# Patient Record
Sex: Female | Born: 1965
Health system: Southern US, Community
[De-identification: ages and names within clinical notes are randomized; demographics above are authoritative.]

## PROBLEM LIST (undated history)

## (undated) DIAGNOSIS — F419 Anxiety disorder, unspecified: Secondary | ICD-10-CM

## (undated) DIAGNOSIS — G709 Myoneural disorder, unspecified: Secondary | ICD-10-CM

## (undated) DIAGNOSIS — Z87442 Personal history of urinary calculi: Secondary | ICD-10-CM

## (undated) DIAGNOSIS — G2581 Restless legs syndrome: Secondary | ICD-10-CM

## (undated) DIAGNOSIS — E785 Hyperlipidemia, unspecified: Secondary | ICD-10-CM

## (undated) DIAGNOSIS — G4730 Sleep apnea, unspecified: Secondary | ICD-10-CM

## (undated) DIAGNOSIS — K219 Gastro-esophageal reflux disease without esophagitis: Secondary | ICD-10-CM

## (undated) DIAGNOSIS — E669 Obesity, unspecified: Secondary | ICD-10-CM

## (undated) DIAGNOSIS — Z8719 Personal history of other diseases of the digestive system: Secondary | ICD-10-CM

## (undated) DIAGNOSIS — M1990 Unspecified osteoarthritis, unspecified site: Secondary | ICD-10-CM

## (undated) DIAGNOSIS — I10 Essential (primary) hypertension: Secondary | ICD-10-CM

## (undated) DIAGNOSIS — N189 Chronic kidney disease, unspecified: Secondary | ICD-10-CM

## (undated) HISTORY — DX: Myoneural disorder, unspecified: G70.9

## (undated) HISTORY — PX: COLONOSCOPY: SHX174

## (undated) HISTORY — DX: Hyperlipidemia, unspecified: E78.5

## (undated) HISTORY — PX: SPINE SURGERY: SHX786

## (undated) HISTORY — DX: Gastro-esophageal reflux disease without esophagitis: K21.9

## (undated) HISTORY — DX: Essential (primary) hypertension: I10

## (undated) HISTORY — PX: REDUCTION MAMMAPLASTY: SUR839

## (undated) HISTORY — DX: Sleep apnea, unspecified: G47.30

## (undated) HISTORY — DX: Chronic kidney disease, unspecified: N18.9

## (undated) HISTORY — DX: Obesity, unspecified: E66.9

## (undated) HISTORY — PX: OTHER SURGICAL HISTORY: SHX169

## (undated) HISTORY — PX: LAPAROSCOPIC GASTRIC BANDING: SHX1100

## (undated) HISTORY — PX: ABDOMINAL HYSTERECTOMY: SHX81

## (undated) HISTORY — PX: BREAST SURGERY: SHX581

---

## 1997-08-10 ENCOUNTER — Observation Stay (HOSPITAL_COMMUNITY): Admission: RE | Admit: 1997-08-10 | Discharge: 1997-08-11 | Payer: Self-pay | Admitting: *Deleted

## 1998-06-16 HISTORY — PX: BREAST SURGERY: SHX581

## 1999-01-10 ENCOUNTER — Encounter: Payer: Self-pay | Admitting: Internal Medicine

## 1999-01-10 ENCOUNTER — Ambulatory Visit (HOSPITAL_COMMUNITY): Admission: RE | Admit: 1999-01-10 | Discharge: 1999-01-10 | Payer: Self-pay | Admitting: Internal Medicine

## 1999-03-01 ENCOUNTER — Encounter (INDEPENDENT_AMBULATORY_CARE_PROVIDER_SITE_OTHER): Payer: Self-pay

## 1999-03-01 ENCOUNTER — Inpatient Hospital Stay (HOSPITAL_COMMUNITY): Admission: RE | Admit: 1999-03-01 | Discharge: 1999-03-03 | Payer: Self-pay | Admitting: Obstetrics & Gynecology

## 1999-07-04 ENCOUNTER — Other Ambulatory Visit: Admission: RE | Admit: 1999-07-04 | Discharge: 1999-07-04 | Payer: Self-pay | Admitting: Plastic Surgery

## 1999-07-04 ENCOUNTER — Encounter (INDEPENDENT_AMBULATORY_CARE_PROVIDER_SITE_OTHER): Payer: Self-pay | Admitting: Specialist

## 2001-04-09 ENCOUNTER — Emergency Department (HOSPITAL_COMMUNITY): Admission: EM | Admit: 2001-04-09 | Discharge: 2001-04-10 | Payer: Self-pay | Admitting: Emergency Medicine

## 2001-04-10 ENCOUNTER — Encounter: Payer: Self-pay | Admitting: Emergency Medicine

## 2001-12-07 ENCOUNTER — Other Ambulatory Visit: Admission: RE | Admit: 2001-12-07 | Discharge: 2001-12-07 | Payer: Self-pay | Admitting: *Deleted

## 2002-05-23 ENCOUNTER — Ambulatory Visit (HOSPITAL_BASED_OUTPATIENT_CLINIC_OR_DEPARTMENT_OTHER): Admission: RE | Admit: 2002-05-23 | Discharge: 2002-05-23 | Payer: Self-pay | Admitting: Pulmonary Disease

## 2003-06-17 HISTORY — PX: TUBAL LIGATION: SHX77

## 2004-05-01 ENCOUNTER — Other Ambulatory Visit: Admission: RE | Admit: 2004-05-01 | Discharge: 2004-05-01 | Payer: Self-pay | Admitting: *Deleted

## 2005-05-26 ENCOUNTER — Other Ambulatory Visit: Admission: RE | Admit: 2005-05-26 | Discharge: 2005-05-26 | Payer: Self-pay | Admitting: *Deleted

## 2006-04-15 ENCOUNTER — Ambulatory Visit: Payer: Self-pay | Admitting: Internal Medicine

## 2006-04-15 LAB — CONVERTED CEMR LAB
ALT: 17 units/L (ref 0–40)
Bilirubin Urine: NEGATIVE
CO2: 29 meq/L (ref 19–32)
Calcium: 8.9 mg/dL (ref 8.4–10.5)
Chloride: 107 meq/L (ref 96–112)
Chol/HDL Ratio, serum: 5.1
Creatinine, Ser: 0.7 mg/dL (ref 0.4–1.2)
Glucose, Bld: 93 mg/dL (ref 70–99)
HDL: 33.3 mg/dL — ABNORMAL LOW (ref >39.0)
Hemoglobin, Urine: NEGATIVE
Hemoglobin: 14 g/dL (ref 12.0–15.0)
Leukocytes, UA: NEGATIVE
MCHC: 33.3 g/dL (ref 30.0–36.0)
MCV: 93.1 fL (ref 78.0–100.0)
Platelets: 309 10*3/uL (ref 150–400)
Potassium: 4.2 meq/L (ref 3.5–5.1)
RBC: 4.52 M/uL (ref 3.87–5.11)
RDW: 13.2 % (ref 11.5–14.6)
TSH: 1.51 microintl units/mL (ref 0.35–5.50)
Triglyceride fasting, serum: 54 mg/dL (ref 0–149)
Urine Glucose: NEGATIVE mg/dL

## 2006-07-03 ENCOUNTER — Ambulatory Visit: Payer: Self-pay | Admitting: Internal Medicine

## 2006-12-31 ENCOUNTER — Emergency Department (HOSPITAL_COMMUNITY): Admission: EM | Admit: 2006-12-31 | Discharge: 2006-12-31 | Payer: Self-pay | Admitting: Emergency Medicine

## 2006-12-31 ENCOUNTER — Encounter: Admission: RE | Admit: 2006-12-31 | Discharge: 2006-12-31 | Payer: Self-pay | Admitting: Surgery

## 2007-01-01 ENCOUNTER — Ambulatory Visit (HOSPITAL_COMMUNITY): Admission: RE | Admit: 2007-01-01 | Discharge: 2007-01-01 | Payer: Self-pay | Admitting: Surgery

## 2007-01-28 ENCOUNTER — Ambulatory Visit (HOSPITAL_COMMUNITY): Admission: RE | Admit: 2007-01-28 | Discharge: 2007-01-28 | Payer: Self-pay | Admitting: Surgery

## 2007-06-14 ENCOUNTER — Ambulatory Visit (HOSPITAL_COMMUNITY): Admission: RE | Admit: 2007-06-14 | Discharge: 2007-06-14 | Payer: Self-pay | Admitting: Surgery

## 2007-06-30 ENCOUNTER — Ambulatory Visit: Payer: Self-pay | Admitting: Internal Medicine

## 2007-06-30 LAB — CONVERTED CEMR LAB
Bilirubin, Direct: 0.1 mg/dL (ref 0.0–0.3)
Cholesterol: 151 mg/dL (ref 0–200)
Eosinophils Absolute: 0.3 10*3/uL (ref 0.0–0.6)
Eosinophils Relative: 4.6 % (ref 0.0–5.0)
GFR calc Af Amer: 142 mL/min
GFR calc non Af Amer: 118 mL/min
Glucose, Bld: 108 mg/dL — ABNORMAL HIGH (ref 70–99)
HCT: 39.5 % (ref 36.0–46.0)
HDL: 31.6 mg/dL — ABNORMAL LOW (ref >39.0)
Lymphocytes Relative: 49.3 % — ABNORMAL HIGH (ref 12.0–46.0)
MCV: 91.2 fL (ref 78.0–100.0)
Neutro Abs: 2.7 10*3/uL (ref 1.4–7.7)
Neutrophils Relative %: 39.2 % — ABNORMAL LOW (ref 43.0–77.0)
Nitrite: NEGATIVE
Potassium: 3.6 meq/L (ref 3.5–5.1)
Sodium: 140 meq/L (ref 135–145)
TSH: 1.44 microintl units/mL (ref 0.35–5.50)
Total CHOL/HDL Ratio: 4.8
Urine Glucose: NEGATIVE mg/dL
Urobilinogen, UA: 0.2 (ref 0.0–1.0)
WBC: 7 10*3/uL (ref 4.5–10.5)

## 2007-07-01 ENCOUNTER — Ambulatory Visit: Payer: Self-pay | Admitting: Internal Medicine

## 2007-07-01 DIAGNOSIS — R030 Elevated blood-pressure reading, without diagnosis of hypertension: Secondary | ICD-10-CM | POA: Insufficient documentation

## 2007-07-01 DIAGNOSIS — G4733 Obstructive sleep apnea (adult) (pediatric): Secondary | ICD-10-CM | POA: Insufficient documentation

## 2007-07-01 DIAGNOSIS — F329 Major depressive disorder, single episode, unspecified: Secondary | ICD-10-CM

## 2007-07-09 ENCOUNTER — Emergency Department (HOSPITAL_COMMUNITY): Admission: EM | Admit: 2007-07-09 | Discharge: 2007-07-09 | Payer: Self-pay | Admitting: Emergency Medicine

## 2007-09-29 ENCOUNTER — Ambulatory Visit: Payer: Self-pay | Admitting: Internal Medicine

## 2007-09-29 DIAGNOSIS — F17200 Nicotine dependence, unspecified, uncomplicated: Secondary | ICD-10-CM

## 2007-09-29 DIAGNOSIS — R195 Other fecal abnormalities: Secondary | ICD-10-CM

## 2007-09-29 DIAGNOSIS — L290 Pruritus ani: Secondary | ICD-10-CM

## 2007-10-04 ENCOUNTER — Encounter (INDEPENDENT_AMBULATORY_CARE_PROVIDER_SITE_OTHER): Payer: Self-pay | Admitting: *Deleted

## 2007-10-18 ENCOUNTER — Ambulatory Visit: Payer: Self-pay | Admitting: Gastroenterology

## 2007-10-25 ENCOUNTER — Ambulatory Visit: Payer: Self-pay | Admitting: Internal Medicine

## 2007-10-27 ENCOUNTER — Telehealth: Payer: Self-pay | Admitting: Gastroenterology

## 2007-10-27 ENCOUNTER — Encounter: Payer: Self-pay | Admitting: Gastroenterology

## 2007-11-18 ENCOUNTER — Ambulatory Visit: Payer: Self-pay | Admitting: Gastroenterology

## 2007-11-18 ENCOUNTER — Encounter: Payer: Self-pay | Admitting: Gastroenterology

## 2007-11-19 ENCOUNTER — Ambulatory Visit: Payer: Self-pay | Admitting: Internal Medicine

## 2007-11-22 ENCOUNTER — Encounter: Payer: Self-pay | Admitting: Gastroenterology

## 2007-12-22 ENCOUNTER — Encounter: Payer: Self-pay | Admitting: Internal Medicine

## 2008-01-04 ENCOUNTER — Ambulatory Visit: Payer: Self-pay | Admitting: Internal Medicine

## 2008-01-27 ENCOUNTER — Ambulatory Visit: Payer: Self-pay | Admitting: Internal Medicine

## 2008-03-07 ENCOUNTER — Ambulatory Visit: Payer: Self-pay | Admitting: Internal Medicine

## 2008-03-07 ENCOUNTER — Encounter (INDEPENDENT_AMBULATORY_CARE_PROVIDER_SITE_OTHER): Payer: Self-pay | Admitting: *Deleted

## 2008-03-07 DIAGNOSIS — G5710 Meralgia paresthetica, unspecified lower limb: Secondary | ICD-10-CM | POA: Insufficient documentation

## 2008-05-17 ENCOUNTER — Encounter: Payer: Self-pay | Admitting: Internal Medicine

## 2008-06-20 ENCOUNTER — Encounter: Admission: RE | Admit: 2008-06-20 | Discharge: 2008-06-20 | Payer: Self-pay | Admitting: Surgery

## 2008-07-11 ENCOUNTER — Ambulatory Visit: Payer: Self-pay | Admitting: Internal Medicine

## 2008-07-13 ENCOUNTER — Telehealth: Payer: Self-pay | Admitting: Internal Medicine

## 2008-10-05 ENCOUNTER — Encounter: Admission: RE | Admit: 2008-10-05 | Discharge: 2009-01-03 | Payer: Self-pay | Admitting: Surgery

## 2008-10-19 ENCOUNTER — Encounter: Payer: Self-pay | Admitting: Internal Medicine

## 2008-10-23 ENCOUNTER — Ambulatory Visit (HOSPITAL_COMMUNITY): Admission: RE | Admit: 2008-10-23 | Discharge: 2008-10-24 | Payer: Self-pay | Admitting: Surgery

## 2008-11-09 ENCOUNTER — Encounter: Payer: Self-pay | Admitting: Internal Medicine

## 2008-11-29 ENCOUNTER — Encounter: Payer: Self-pay | Admitting: Women's Health

## 2008-11-29 ENCOUNTER — Other Ambulatory Visit: Admission: RE | Admit: 2008-11-29 | Discharge: 2008-11-29 | Payer: Self-pay | Admitting: Obstetrics and Gynecology

## 2008-11-29 ENCOUNTER — Ambulatory Visit: Payer: Self-pay | Admitting: Women's Health

## 2008-12-07 ENCOUNTER — Ambulatory Visit: Payer: Self-pay | Admitting: Women's Health

## 2009-01-03 ENCOUNTER — Encounter: Payer: Self-pay | Admitting: Internal Medicine

## 2009-01-22 ENCOUNTER — Encounter: Admission: RE | Admit: 2009-01-22 | Discharge: 2009-04-22 | Payer: Self-pay | Admitting: Surgery

## 2009-05-22 ENCOUNTER — Ambulatory Visit: Payer: Self-pay | Admitting: Internal Medicine

## 2009-05-22 DIAGNOSIS — H532 Diplopia: Secondary | ICD-10-CM

## 2009-05-24 LAB — CONVERTED CEMR LAB
AST: 22 units/L (ref 0–37)
Albumin: 3.7 g/dL (ref 3.5–5.2)
Alkaline Phosphatase: 71 units/L (ref 39–117)
BUN: 8 mg/dL (ref 6–23)
CRP, High Sensitivity: 9.9 — ABNORMAL HIGH (ref 0.00–5.00)
Eosinophils Relative: 2.7 % (ref 0.0–5.0)
GFR calc non Af Amer: 140.8 mL/min (ref >60)
HCT: 41.7 % (ref 36.0–46.0)
Hemoglobin: 14.3 g/dL (ref 12.0–15.0)
Lymphs Abs: 4.5 10*3/uL — ABNORMAL HIGH (ref 0.7–4.0)
Monocytes Relative: 3.8 % (ref 3.0–12.0)
Platelets: 279 10*3/uL (ref 150.0–400.0)
Potassium: 4 meq/L (ref 3.5–5.1)
Sodium: 140 meq/L (ref 135–145)
Specific Gravity, Urine: 1.02 (ref 1.000–1.030)
Total Protein, Urine: NEGATIVE mg/dL
Urine Glucose: NEGATIVE mg/dL
WBC: 9.8 10*3/uL (ref 4.5–10.5)
pH: 6 (ref 5.0–8.0)

## 2009-10-24 ENCOUNTER — Emergency Department (HOSPITAL_COMMUNITY): Admission: EM | Admit: 2009-10-24 | Discharge: 2009-10-24 | Payer: Self-pay | Admitting: Emergency Medicine

## 2009-11-19 ENCOUNTER — Ambulatory Visit: Payer: Self-pay | Admitting: Women's Health

## 2009-11-22 ENCOUNTER — Ambulatory Visit: Payer: Self-pay | Admitting: Obstetrics and Gynecology

## 2009-12-21 ENCOUNTER — Ambulatory Visit: Payer: Self-pay | Admitting: Obstetrics and Gynecology

## 2009-12-26 ENCOUNTER — Ambulatory Visit: Payer: Self-pay | Admitting: Obstetrics and Gynecology

## 2010-07-07 ENCOUNTER — Encounter: Payer: Self-pay | Admitting: Internal Medicine

## 2010-08-02 ENCOUNTER — Other Ambulatory Visit: Payer: Self-pay | Admitting: Internal Medicine

## 2010-08-02 DIAGNOSIS — Z1231 Encounter for screening mammogram for malignant neoplasm of breast: Secondary | ICD-10-CM

## 2010-08-20 ENCOUNTER — Ambulatory Visit (HOSPITAL_COMMUNITY)
Admission: RE | Admit: 2010-08-20 | Discharge: 2010-08-20 | Disposition: A | Discharge status: Home / Self Care | Payer: BC Managed Care – PPO | Source: Ambulatory Visit | Attending: Internal Medicine | Admitting: Internal Medicine

## 2010-08-20 DIAGNOSIS — Z1231 Encounter for screening mammogram for malignant neoplasm of breast: Secondary | ICD-10-CM | POA: Insufficient documentation

## 2010-08-22 ENCOUNTER — Encounter: Payer: Self-pay | Admitting: Internal Medicine

## 2010-08-27 NOTE — Miscellaneous (Signed)
Summary: mammogram 2012  Clinical Lists Changes  Observations: Added new observation of MAMMOGRAM: normal (08/20/2010 9:28)      Preventive Care Screening  Mammogram:    Date:  08/20/2010    Results:  normal

## 2010-09-03 LAB — URINALYSIS, ROUTINE W REFLEX MICROSCOPIC
Bilirubin Urine: NEGATIVE
Glucose, UA: NEGATIVE mg/dL
Ketones, ur: NEGATIVE mg/dL
Protein, ur: NEGATIVE mg/dL
pH: 5.5 (ref 5.0–8.0)

## 2010-09-12 ENCOUNTER — Telehealth: Payer: Self-pay | Admitting: Internal Medicine

## 2010-09-12 NOTE — Telephone Encounter (Signed)
Patient's cpap machine broke last night and she has not had any sleep since then. [facility] is requesting a rx before they will issue another one.

## 2010-09-12 NOTE — Telephone Encounter (Signed)
Order for CPAP 10cm with heated humidifier faxed to [facility] at [phone].  I spoke to [place] there and she states this is all that's needed to get pt a new machine.    Pt informed

## 2010-09-24 LAB — DIFFERENTIAL
Basophils Absolute: 0 10*3/uL (ref 0.0–0.1)
Basophils Relative: 1 % (ref 0–1)
Eosinophils Absolute: 0.2 10*3/uL (ref 0.0–0.7)
Eosinophils Relative: 0 % (ref 0–5)
Eosinophils Relative: 3 % (ref 0–5)
Lymphocytes Relative: 11 % — ABNORMAL LOW (ref 12–46)
Lymphocytes Relative: 49 % — ABNORMAL HIGH (ref 12–46)
Lymphs Abs: 1.2 10*3/uL (ref 0.7–4.0)
Monocytes Absolute: 0.3 10*3/uL (ref 0.1–1.0)
Monocytes Relative: 6 % (ref 3–12)
Neutro Abs: 2.5 10*3/uL (ref 1.7–7.7)
Neutro Abs: 8.8 10*3/uL — ABNORMAL HIGH (ref 1.7–7.7)
Neutrophils Relative %: 41 % — ABNORMAL LOW (ref 43–77)

## 2010-09-24 LAB — COMPREHENSIVE METABOLIC PANEL
Albumin: 3.6 g/dL (ref 3.5–5.2)
Alkaline Phosphatase: 66 U/L (ref 39–117)
BUN: 11 mg/dL (ref 6–23)
Calcium: 9.1 mg/dL (ref 8.4–10.5)
Creatinine, Ser: 0.59 mg/dL (ref 0.4–1.2)
Glucose, Bld: 110 mg/dL — ABNORMAL HIGH (ref 70–99)
Potassium: 3.9 mEq/L (ref 3.5–5.1)
Total Protein: 7.1 g/dL (ref 6.0–8.3)

## 2010-09-24 LAB — CBC
HCT: 38 % (ref 36.0–46.0)
HCT: 39.3 % (ref 36.0–46.0)
Hemoglobin: 12.9 g/dL (ref 12.0–15.0)
Hemoglobin: 13.5 g/dL (ref 12.0–15.0)
MCHC: 34.5 g/dL (ref 30.0–36.0)
Platelets: 281 10*3/uL (ref 150–400)
RDW: 13.1 % (ref 11.5–15.5)
RDW: 13.5 % (ref 11.5–15.5)
WBC: 10.3 10*3/uL (ref 4.0–10.5)

## 2010-09-24 LAB — PREGNANCY, URINE: Preg Test, Ur: NEGATIVE

## 2010-09-25 ENCOUNTER — Other Ambulatory Visit: Payer: Self-pay | Admitting: Internal Medicine

## 2010-09-25 NOTE — Telephone Encounter (Signed)
Needs OV.  

## 2010-10-29 NOTE — Op Note (Signed)
NAME:  Kaitlyn Whitney, Kaitlyn Whitney                ACCOUNT NO.:  [id number]   MEDICAL RECORD NO.:  [id number]          PATIENT TYPE:  OIB   LOCATION:  0098                         FACILITY:  [facility]   PHYSICIAN:  Sandria Bales. Ezzard Standing, M.D.  DATE OF BIRTH:  [birth year]   DATE OF PROCEDURE:  10/23/2008  DATE OF DISCHARGE:                               OPERATIVE REPORT   Date of Surgery - 24 Oct 2008   PREOPERATIVE DIAGNOSIS:  Morbid obesity (weight 277, BMI 54.5.)   POSTOPERATIVE DIAGNOSIS:  Morbid obesity (weight 277, BMI 54.5.)   PROCEDURE:  Laparoscopic band with AP large band, subcutaneous port.   SURGEON:  Sandria Bales. Ezzard Standing, M.D.   FIRST ASSISTANT:  Thornton Park. Daphine Deutscher, M.D.   ANESTHESIA:  General endotracheal.   ESTIMATED BLOOD LOSS:  Minimal.   INDICATIONS FOR PROCEDURE:  Ms. Sura Canul is a 45 year old, black  female who is a patient of Dr. Sonda Primes, who has been morbidly  obese much of her adult life.  She has been through our preoperative  bariatric program and now comes for lap band placement.  The indications  and potential complications of lap-band surgery were explained to the  patient.  The potential complications of the lap-band surgery include,  but are not limited to, bleeding, infection, bowel injury, slippage of  the band, erosion of the band, and long-term nutritional consequences.   OPERATIVE NOTE:  The patient placed in a supine position.  Her abdomen  was prepped with ChloraPrep.  She was given antibiotics preoperatively.  She was sterilely draped and a time-out was held identifying the patient  and the procedure.   I accessed the abdominal cavity through the left upper quadrant with an  11-mm Ethicon trocar.  Once I was in the abdominal cavity, I carried out  abdominal exploration.  Right and left lobes of the liver were  unremarkable.  She did have some adhesions to her lower abdomen of  omentum.  This was probably from a prior lap tubal exploration, but  these did  not appear to be of any consequence regarding her band, and  the stomach that I could see was unremarkable.   I then placed a good 4 additional trocars:  A 5-mm subxiphoid trocar for  the [facility] retractor, a 15-mm right subcostal trocar, an 11-mm right  paramedian trocar and 11-mm left paramedian trocar.   I first dissected out and placed a Nathanson retractor on the left lobe  of the liver.  The patient had a lot of fat at her gastroesophageal  junction.  She did not have any evidence of a hiatal hernia on preop  evaluation, nor was she symptomatic.   I made a window along the gastroesophageal junction to the left of the  angle of His.  I dissected along the left crus.  I then went along the  right gastroesophageal junction.  I opened the gastrohepatic ligament,  found the right crus, made a small incision anterior to the crus and  then passed the finger dissector posterior to the stomach and around to  the angle of His.  Because she had so much fat at her gastroesophageal junction and up her  abdomen, I used an AP large band.  I placed the band around the stomach.  I had anesthesia pass the sizing tube.  They did blow up the balloon  with a 15 mL of air and pulled it back, but there was no evidence of  hiatal hernia.  I then cinched the balloon down around the sizing tube  and then removed the sizing tube.   I then imbricated the stomach over the band laterally.  I placed 3  sutures using a 0-Ethibond suture with a tie knot on each of these 3  sutures.   After completion of the band placement, I then took a photo and placed  this in the chart.  The band seemed to lie right.  There was no tension  either on the band or the stomach.   I then brought the tubing out through the right paramedian incision.  I  removed all the trocars in turn with no bleeding at trocar site.  Because she was thick enough, I went on and decided I would place a  subcutaneous port, so I developed a  pocket lateral to my right  paramedian incision.  I did sew a polypropylene mesh on the back of the  band and held this in place with a 2-0 Prolene suture and then inserted  this lateral to the wound and fed the tubing back into the stomach.  I  closed the subcutaneous tissues with a 2-0 Vicryl suture, the skin with  a 5-0 Vicryl suture, painted the wound with tincture of Benzoin and  Steri-Stripped it.   The patient tolerated the procedure well, was transported to the  recovery room in good condition.  Sponge and needle count were correct  at the end of the case.      Sandria Bales. Ezzard Standing, M.D.  Electronically Signed     DHN/MEDQ  D:  10/23/2008  T:  10/23/2008  Job:  [id number]   cc:   Georgina Quint. Plotnikov, MD  [address]  [place]  [place] [address]

## 2010-11-01 NOTE — Letter (Signed)
September 23, 2006    [name]  [address]  [place], [place] [address]   RE:  Kaitlyn Whitney, Kaitlyn Whitney  MRN:  [id number]  /  DOB:  15-Oct-1965   To Whom It May Concern:   Kaitlyn Whitney has been a patient of mine for a number of years.  She is  overweight with a body mass index of 54 and would benefit from lap band  surgery in many ways.  She has been suffering with obstructive sleep  apnea and insomnia.   Her documented weights are as follows:   September 28, 1997, 228 pounds.  November 14, 1997, 227.  November 23, 1997, 212.  January 10, 1999, 236.  April 12, 2001, 242.  April 11, 2002, 256.  April 13, 2002, 254.  October 13, 2003, 265.  April 15, 2006, 267.  July 03, 2006, 278.    Sincerely,      Georgina Quint. Plotnikov, MD  Electronically Signed    AVP/MedQ  DD: 09/23/2006  DT: 09/23/2006  Job #: [id number]   CC:    Blanket Atkinson [id number] [address], [address]

## 2010-11-01 NOTE — Letter (Signed)
Oct 28, 2006    [facility]  [address]  [place], [place]  [address]   RE:  [name], [name]  MRN:  [id number]  /  DOB:  [birth year]   To Whom It May Concern:   Ms. Kaitlyn Whitney has been a patient of mine for a number of years.  She  has been suffering from morbid obesity.  Her comorbidities include  elevated blood pressure and elevated glucose.  She has failed multiple  attempts with diet and exercise.   Her weight in 2003 was 264 pounds.  In 2005, 265.  In 2007, 267.  In  2008, 278 with a Body Mass Index around 54.  She is very motivated to  lose weight.  I think she would be an excellent candidate for a lap band  procedure.  I fully support her endeavor.  In my opinion, it is  medically necessary for her to have the procedure done.    Sincerely,      Georgina Quint. Plotnikov, MD  Electronically Signed    AVP/MedQ  DD: 10/28/2006  DT: 10/28/2006  Job #: [id number]

## 2011-01-10 ENCOUNTER — Encounter (INDEPENDENT_AMBULATORY_CARE_PROVIDER_SITE_OTHER): Payer: Self-pay

## 2011-03-07 LAB — URINALYSIS, ROUTINE W REFLEX MICROSCOPIC
Bilirubin Urine: NEGATIVE
Glucose, UA: NEGATIVE
Nitrite: NEGATIVE
Specific Gravity, Urine: 1.02
pH: 6

## 2011-03-07 LAB — I-STAT 8, (EC8 V) (CONVERTED LAB)
BUN: 9
Bicarbonate: 20.5
Chloride: 107
HCT: 44
Hemoglobin: 15
Operator id: 161631
Potassium: 3.5
Sodium: 139

## 2011-03-07 LAB — CBC
HCT: 40
Hemoglobin: 13.6
RBC: 4.39

## 2011-03-07 LAB — DIFFERENTIAL
Eosinophils Relative: 3
Lymphocytes Relative: 42
Monocytes Absolute: 0.6
Monocytes Relative: 6
Neutro Abs: 4.9

## 2012-01-26 ENCOUNTER — Encounter: Payer: Self-pay | Admitting: *Deleted

## 2012-01-26 ENCOUNTER — Encounter: Payer: BC Managed Care – PPO | Attending: Family Medicine | Admitting: *Deleted

## 2012-01-26 DIAGNOSIS — Z713 Dietary counseling and surveillance: Secondary | ICD-10-CM | POA: Insufficient documentation

## 2012-01-26 DIAGNOSIS — I10 Essential (primary) hypertension: Secondary | ICD-10-CM | POA: Insufficient documentation

## 2012-01-26 NOTE — Patient Instructions (Signed)
Goals:  Eat 3 meals/day, Avoid meal skipping   Increase protein rich foods  Follow "Plate Method" for portion control  Limit carbohydrate1-2 servings/meal   Choose more whole grains, lean protein, low-fat dairy, and fruits/non-starchy vegetables.   Aim for >30 min of physical activity daily- water aerobics or Wii with daughter  Limit sugar-sweetened beverages and concentrated sweets- no sodas or sports drinks.  Use crystal light

## 2012-01-26 NOTE — Progress Notes (Signed)
  Medical Nutrition Therapy:  Appt start time: 1600 end time:  1700.   Assessment:  Primary concerns today: obesity and HTN.   MEDICATIONS: see list   DIETARY INTAKE:  Usual eating pattern includes 1-3 meals and 2 snacks per day.  Everyday foods include processed, packaged, energy-dense foods.  Avoided foods include none.    24-hr recall:  B ( AM): [place] 2 sausage biscuit wont eat bread and coffee creamer and sugar (3 days) Snk ( AM):  pastry bar (4-5) and soda   L ( PM): skips usually Snk ( PM): sandwich or chips D ( PM): chicken and dumplings; taco salads; chili and crackers. Goes out on weekends Snk ( PM): chips or ice cream (fruit tarre) Beverages: water  Usual physical activity: water aerobics 3 day/week  Estimated energy needs: 1400 calories 158 g carbohydrates 105 g protein 39 g fat  Progress Towards Goal(s):  In progress.   Nutritional Diagnosis:  [place]-3.3 Overweight/obesity As related to irregular eating pattern, large portions of energy-dense foods and beverages combined with limited physical activity.  As evidenced by BMI of 52.5.    Intervention:  Nutrition counseling provided.  Focused on weight loss today as a way to bring down blood pressure. Gwenn has been shooting for 1600 calories/day, but she has unknowlingly been counting inadequately.  Her daily intake is closer to 2000-2200.  Encouraged 3 meals/day and to avoid meal skipping.  Discussed healthy breakfast.  Encouraged healthy lunch (discussed healthy fast food options) discussed MyPlate for meal planning and portion control.  Encouraged more physical activity.  She likes to play Wii with daughter.  Discouraged sugary beverages.  Discussed reading food labels.  Encouraged reduction in added salt, fat, and sugars.  Recommended whole grains, lean proteins, and vegetables.    Handouts given during visit include:  Tips for weight loss  My meal plan card  Healthy snacks  Monitoring/Evaluation:  Dietary  intake, exercise, and body weight in 1 month(s).

## 2012-02-23 ENCOUNTER — Encounter: Payer: BC Managed Care – PPO | Attending: Family Medicine | Admitting: *Deleted

## 2012-02-23 DIAGNOSIS — I10 Essential (primary) hypertension: Secondary | ICD-10-CM | POA: Insufficient documentation

## 2012-02-23 DIAGNOSIS — Z713 Dietary counseling and surveillance: Secondary | ICD-10-CM | POA: Insufficient documentation

## 2012-04-19 ENCOUNTER — Other Ambulatory Visit: Payer: Self-pay | Admitting: Women's Health

## 2012-04-22 ENCOUNTER — Encounter: Payer: Self-pay | Admitting: Women's Health

## 2012-04-22 ENCOUNTER — Ambulatory Visit (INDEPENDENT_AMBULATORY_CARE_PROVIDER_SITE_OTHER): Payer: 59 | Admitting: Women's Health

## 2012-04-22 VITALS — BP 138/86 | Ht 61.0 in | Wt 266.0 lb

## 2012-04-22 DIAGNOSIS — Z23 Encounter for immunization: Secondary | ICD-10-CM

## 2012-04-22 DIAGNOSIS — Z9889 Other specified postprocedural states: Secondary | ICD-10-CM

## 2012-04-22 DIAGNOSIS — Z01419 Encounter for gynecological examination (general) (routine) without abnormal findings: Secondary | ICD-10-CM

## 2012-04-22 DIAGNOSIS — Z113 Encounter for screening for infections with a predominantly sexual mode of transmission: Secondary | ICD-10-CM

## 2012-04-22 DIAGNOSIS — E079 Disorder of thyroid, unspecified: Secondary | ICD-10-CM

## 2012-04-22 DIAGNOSIS — Z833 Family history of diabetes mellitus: Secondary | ICD-10-CM

## 2012-04-22 DIAGNOSIS — Z13220 Encounter for screening for lipoid disorders: Secondary | ICD-10-CM

## 2012-04-22 NOTE — Patient Instructions (Addendum)

## 2012-04-22 NOTE — Progress Notes (Signed)
Kaitlyn Whitney 12/28/1965 [id number]    History:    The patient presents for annual exam.  Monthly light cycles, her option ablation 12/2009 with good relief of menorrhagia. Had Lap band surgery done in 2010 has gained some weight , is attempting weight loss with diet and exercise. History of infertility/fibroids. Smoker, down to 3 cigarettes per day using Chantix and hopes to be smoke-free in the next month. Requesting STD screen/ has been separated. History of normal Pap in 2010, overdue for mammogram. History of fibroids , largest 3 cm.  Past medical history, past surgical history, family history and social history were all reviewed and documented in the EPIC chart. Works for [employer]. 2 adopted children,  Kaitlyn Whitney 13 doing well, son 75 in the [employer]. Mother hypertension.   ROS:  A  ROS was performed and pertinent positives and negatives are included in the history.  Exam:  Filed Vitals:   04/22/12 1429  BP: 138/86    General appearance:  Normal Head/Neck:  Normal, without cervical or supraclavicular adenopathy. Thyroid:  Symmetrical, normal in size, without palpable masses or nodularity. Respiratory  Effort:  Normal  Auscultation:  Clear without wheezing or rhonchi Cardiovascular  Auscultation:  Regular rate, without rubs, murmurs or gallops  Edema/varicosities:  Not grossly evident Abdominal  Soft,nontender, without masses, guarding or rebound.  Liver/spleen:  No organomegaly noted  Hernia:  None appreciated  Skin  Inspection:  Grossly normal  Palpation:  Grossly normal Neurologic/psychiatric  Orientation:  Normal with appropriate conversation.  Mood/affect:  Normal  Genitourinary    Breasts: Examined lying and sitting/reduction.     Right: Without masses, retractions, discharge or axillary adenopathy.     Left: Without masses, retractions, discharge or axillary adenopathy.   Inguinal/mons:  Normal without inguinal adenopathy  External genitalia:   Normal  BUS/Urethra/Skene's glands:  Normal  Bladder:  Normal  Vagina:  Normal  Cervix:  Normal  Uterus:   Bulky,  Midline and mobile  Adnexa/parametria:     Rt: Without masses or tenderness.   Lt: Without masses or tenderness.  Anus and perineum: Normal  Digital rectal exam: Normal sphincter tone without palpated masses or tenderness  Assessment/Plan:  46 y.o. MBF G0 2 adopted children for annual exam.    STD screen per request Her option ablation 12/2009 good relief of menorrhagia/fibroids. Smoker-Chantix per primary care Minimal healthcare in 2 years Morbid obesity  Plan: Continue decreasing calories and increasing exercise for continued weight loss. SBE's, schedule mammogram which is overdue. Calcium rich diet, vitamin D 1000 daily encouraged. CBC, glucose, lipid panel, UA, Pap, GC/Chlamydia, HIV, hepatitis B., C. and RPR. Flu vaccine given. Blood pressure 136/86 reviewed it is too high will monitor away from office return to primary care if remains elevated.   [facility], 3:16 PM 04/22/2012

## 2012-04-23 ENCOUNTER — Encounter: Payer: Self-pay | Admitting: Obstetrics and Gynecology

## 2012-04-26 ENCOUNTER — Encounter: Payer: Self-pay | Admitting: Women's Health

## 2012-05-06 ENCOUNTER — Ambulatory Visit (INDEPENDENT_AMBULATORY_CARE_PROVIDER_SITE_OTHER): Payer: 59 | Admitting: Women's Health

## 2012-05-06 ENCOUNTER — Encounter: Payer: Self-pay | Admitting: Women's Health

## 2012-05-06 DIAGNOSIS — Z113 Encounter for screening for infections with a predominantly sexual mode of transmission: Secondary | ICD-10-CM

## 2012-05-06 NOTE — Progress Notes (Signed)
Patient ID: Kaitlyn Whitney, female   DOB: 1965-08-05, 46 y.o.   MRN: [id number] Presents for retesting for GC/ Chlamydia, was omitted by lab in error. Without complaint today.  Exam: External genitalia within normal limits, speculum exam scant white discharge without odor or erythema. GC/Chlamydia culture taken and is pending.  STD screen  Plan: Reviewed HIV, hepatitis, RPR- negative. GC/Chlamydia culture pending.

## 2012-05-10 ENCOUNTER — Encounter: Payer: Self-pay | Admitting: Women's Health

## 2012-05-27 ENCOUNTER — Ambulatory Visit (INDEPENDENT_AMBULATORY_CARE_PROVIDER_SITE_OTHER): Payer: 59 | Admitting: Family Medicine

## 2012-05-27 VITALS — BP 146/88 | HR 86 | Temp 97.9°F | Resp 16 | Ht 62.5 in | Wt 268.4 lb

## 2012-05-27 DIAGNOSIS — H1030 Unspecified acute conjunctivitis, unspecified eye: Secondary | ICD-10-CM

## 2012-05-27 DIAGNOSIS — R030 Elevated blood-pressure reading, without diagnosis of hypertension: Secondary | ICD-10-CM

## 2012-05-27 MED ORDER — CIPROFLOXACIN HCL 0.3 % OP SOLN: 1.0000 [drp] | OPHTHALMIC | Status: DC

## 2012-05-27 NOTE — Progress Notes (Signed)
  Subjective:    Patient ID: Kaitlyn Whitney, female    DOB: 1965/10/04, 46 y.o.   MRN: [id number]  HPI  Started 2d ago in right eye and now moving to left eye. No h/o pink eye.  Having clear drainage and eyes matted shut and crusted in the a.m.  Feels like something flies in eye and leaves - has a few sec of burning pain every 1 - 2 hrs.  Vision is blurred, constant in right eye.  Gets worse with bright light (staring at computer).  Past Medical History  Diagnosis Date  . Obesity   . Hypertension   . Sleep apnea     Review of Systems  Constitutional: Negative for fever, chills, diaphoresis and activity change.  HENT: Negative for ear pain, congestion, sore throat, rhinorrhea, neck pain, neck stiffness, dental problem, sinus pressure and tinnitus.   Eyes: Positive for photophobia, pain, discharge, redness, itching and visual disturbance.  Skin: Negative for rash.  Neurological: Negative for dizziness, syncope, facial asymmetry, weakness, light-headedness, numbness and headaches.  Hematological: Negative for adenopathy.  Psychiatric/Behavioral: Negative for sleep disturbance.      BP 146/88  Pulse 86  Temp 97.9 F (36.6 C) (Oral)  Resp 16  Ht 5' 2.5" (1.588 m)  Wt 268 lb 6.4 oz (121.745 kg)  BMI 48.31 kg/m2  SpO2 97% Objective:   Physical Exam  Constitutional: She is oriented to person, place, and time. She appears well-developed and well-nourished. No distress.  HENT:  Head: Normocephalic and atraumatic.  Right Ear: External ear normal.  Eyes: EOM are normal. Pupils are equal, round, and reactive to light. Right eye exhibits discharge. Right eye exhibits no chemosis and no exudate. Left eye exhibits discharge. Left eye exhibits no chemosis and no exudate. Right conjunctiva is injected. Left conjunctiva is injected. No scleral icterus.  Fundoscopic exam:      The right eye shows no hemorrhage and no papilledema.       The left eye shows no hemorrhage and no papilledema.   Bilateral watery discharge  Pulmonary/Chest: Effort normal.  Neurological: She is alert and oriented to person, place, and time.  Skin: Skin is warm and dry. She is not diaphoretic. No erythema.  Psychiatric: She has a normal mood and affect. Her behavior is normal.      Assessment & Plan:   1. Conjunctivitis, acute  ciprofloxacin (CILOXAN) 0.3 % ophthalmic solution  2. ELEVATED BP  Recheck with PCP - hopefully she can avoid med if she stops smoking.  3. Tobacco abuse - Encouraged cessation - cont chantix

## 2012-06-17 ENCOUNTER — Telehealth (INDEPENDENT_AMBULATORY_CARE_PROVIDER_SITE_OTHER): Payer: Self-pay | Admitting: Surgery

## 2012-06-17 NOTE — Telephone Encounter (Signed)
05/12/12 mailed recall letter for bariatric surgery follow-up to pt. Advised pt to call [facility] at [phone] to °schedule appt. (lss) ° °

## 2012-08-26 ENCOUNTER — Encounter (INDEPENDENT_AMBULATORY_CARE_PROVIDER_SITE_OTHER): Payer: Self-pay

## 2012-08-26 ENCOUNTER — Ambulatory Visit (INDEPENDENT_AMBULATORY_CARE_PROVIDER_SITE_OTHER): Payer: Managed Care, Other (non HMO) | Admitting: Physician Assistant

## 2012-08-26 VITALS — BP 140/100 | HR 88 | Temp 97.2°F | Resp 24 | Ht 60.0 in | Wt 274.0 lb

## 2012-08-26 DIAGNOSIS — Z4651 Encounter for fitting and adjustment of gastric lap band: Secondary | ICD-10-CM

## 2012-08-26 NOTE — Progress Notes (Signed)
  HISTORY: Kaitlyn Whitney is a 47 y.o.female who received a lap-band in May 2010 by Dr. Ezzard Standing. Her pre-op weight was around 260 lbs. She hasn't been seen here in some time and is very discouraged with her results. She's now 274 lbs. She describes being able to finish a normal plate of food within 30 minutes. She has no persistent regurgitation or reflux.  VITAL SIGNS: Filed Vitals:   08/26/12 1417  BP: 140/100  Pulse: 88  Temp: 97.2 F (36.2 C)  Resp: 24    PHYSICAL EXAM: Physical exam reveals a very well-appearing 47 y.o.female in no apparent distress Neurologic: Awake, alert, oriented Psych: Bright affect, conversant Respiratory: Breathing even and unlabored. No stridor or wheezing Abdomen: Soft, nontender, nondistended to palpation. Incisions well-healed. No incisional hernias. Port easily palpated. Extremities: Atraumatic, good range of motion.  ASSESMENT: 47 y.o.  female  s/p lap-band.   PLAN: The patient's port was accessed with a 20G Huber needle without difficulty. Clear fluid was aspirated and 0.5 mL saline was added to the port. The patient was able to swallow water without difficulty following the procedure and was instructed to take clear liquids for the next 24-48 hours and advance slowly as tolerated.

## 2012-08-26 NOTE — Patient Instructions (Signed)
Take clear liquids tonight. Thin protein shakes are ok to start tomorrow morning. Slowly advance your diet thereafter. Call us if you have persistent vomiting or regurgitation, night cough or reflux symptoms. Return as scheduled or sooner if you notice no changes in hunger/portion sizes.  

## 2012-09-02 ENCOUNTER — Encounter (INDEPENDENT_AMBULATORY_CARE_PROVIDER_SITE_OTHER): Payer: Self-pay | Admitting: Surgery

## 2012-09-02 ENCOUNTER — Ambulatory Visit (INDEPENDENT_AMBULATORY_CARE_PROVIDER_SITE_OTHER): Payer: Managed Care, Other (non HMO) | Admitting: Surgery

## 2012-09-02 VITALS — BP 123/77 | HR 72 | Temp 97.6°F | Resp 16 | Ht 60.0 in | Wt 273.6 lb

## 2012-09-02 DIAGNOSIS — Z9884 Bariatric surgery status: Secondary | ICD-10-CM

## 2012-09-02 NOTE — Progress Notes (Signed)
[facility]  Ovidio Kin, MD,  FACS [address]  [address] [place], [place]    [address] Phone:  [phone] FAX:  [phone]   Re:   Kaitlyn Whitney DOB:   04-17-1966 MRN:   [id number]  ASSESSMENT AND PLAN: 1.  Lap band, APL  Placed 10/23/2008 - D. Anokhi Shannon  Initial weight - 277, BMI - 54.5  2.  Pain in her knees 3.  Sleep apnea 4.  Quit smoking cigarettes  I think that she is back smoking - I did not address this. 5.  History of hypertension 6.  Remote history of kidney stones 7.  Low back pain  HISTORY OF PRESENT ILLNESS: Chief Complaint  Patient presents with  . Bariatric Pre-op  [I started out at a disadvantage, in that her chart was not available.  But the chart was found by the end of office.]   Kaitlyn Whitney is a 47 y.o. (DOB: July 05, 1965)  AA  female who is a patient of Dr. Mervyn Skeeters. Plotnikov and comes to me today for follow up of lap band.  She started out saying that she wants to go to a gastric bypass.   But she had not been seen in our office until last week by Mardelle Matte.  Since we had no chart, Mardelle Matte was not sure what type of lap band she had.  I walked through different thing with her.  For breakfast she has liquids, mainly coffee.  She seems to drink two 12-16 oz cups, made by her husband.  He adds 4 spoonfuls of sugar to each cup.    She has a [employer] for exercise and has membership at [employer], but admits that she is not doing this.  She also tried water aerobics at [employer], but has not done that in a while.  According to her husband, she is eating within an hour of going to sleep, which is probably not good.  She said that she went to the nutritionist about 8 weeks.ago.  But according to Epic, I see a note from Denny Levy, 01/26/2012, and there is no mention a lap band.  So not sure about the consult and timing.  So I spent at least 20 minutes talking to her about using the lap band (she already has it) and focusing doing the right things.  And  if those don't work, then consider revisional surgery.  Social history: Married, husband with her. She a 72 yo girl and a 65 yo grandchild (through a step son??) She works at [employer] (as she says, on her butt all day)  PHYSICAL EXAM: BP 123/77  Pulse 72  Temp(Src) 97.6 F (36.4 C) (Temporal)  Resp 16  Ht 5' (1.524 m)  Wt 273 lb 9.6 oz (124.104 kg)  BMI 53.43 kg/m2  General: obese AA F who is alert and generally healthy appearing.  HEENT: Normal. Pupils equal. Good dentition. Neck: Supple. No mass.  No thyroid mass.   Lymph Nodes:  No supraclavicular or cervical nodes. Lungs: Clear to auscultation and symmetric breath sounds. Heart:  RRR. No murmur or rub. Abdomen: Soft. No mass. No tenderness. No hernia. Normal bowel sounds.  Port in the RUQ. Extremities:  Good strength and ROM  in upper and lower extremities. Neurologic:  Grossly intact to motor and sensory function.  Procedure:  While in the office, I accessed her lap band, she has at least 10 cc.  I added 1 cc.  The next visit, will  check amount with 20 cc syringe.  She tolerated water.  DATA REVIEWED: Epic notes   Ovidio Kin, MD, FACS Office:  [phone]

## 2012-10-07 ENCOUNTER — Encounter (INDEPENDENT_AMBULATORY_CARE_PROVIDER_SITE_OTHER): Payer: Managed Care, Other (non HMO) | Admitting: Surgery

## 2012-10-21 ENCOUNTER — Encounter (INDEPENDENT_AMBULATORY_CARE_PROVIDER_SITE_OTHER): Payer: Self-pay | Admitting: Surgery

## 2012-10-21 ENCOUNTER — Ambulatory Visit (INDEPENDENT_AMBULATORY_CARE_PROVIDER_SITE_OTHER): Payer: Managed Care, Other (non HMO) | Admitting: Surgery

## 2012-10-21 VITALS — BP 134/82 | HR 84 | Resp 18 | Ht 60.0 in | Wt 263.8 lb

## 2012-10-21 DIAGNOSIS — Z9884 Bariatric surgery status: Secondary | ICD-10-CM

## 2012-10-21 NOTE — Progress Notes (Signed)
[facility]ENTRAL  [facility]ERY  Ovidio Kin, MD,  FACS [address] [place]  [address] [place], [place]    [address] Phone:  [phone] FAX:  [phone]   Re:   Kaitlyn Whitney DOB:   [birth year] MRN:   [id number]  ASSESSMENT AND PLAN: 1.  Lap band, APL  Placed 10/23/2008 - D. Silena Wyss  Initial weight - 277, BMI - 54.5  She has lost 14 pounds since I last saw her, so she can improve her behavior.  She did not need a fill.    I will see her back in 3 months.  2.  Pain in her knees 3.  Sleep apnea 4.  Smoking cigarettes  We talked about quitting and the risks of cigarettes - related to her overall health and the lap band. 5.  History of hypertension 6.  Remote history of kidney stones 7.  Low back pain  HISTORY OF PRESENT ILLNESS: No chief complaint on file.   Kaitlyn Whitney is a 47 y.o. (DOB: [birth year])  AA  female who is a patient of Dr. Mervyn Skeeters. Plotnikov and comes to me today for follow up of lap band.  She is dong some things right.  She had cut out soft drinks, she is exercising some with the Wi.  She is smoking and we talked about that.  She still drinks her coffee with sugar (probably 200 to 300 cal/day) and we talked about stopping that.  And we talked about increasing exercise - like walking a mile or 2 per day and going to the [place], which she has still not been to. She is happy with her weight loss and does not think that she needs a lap band fill.  I think that she has a better handle on what she needs to do - I'll see her back in 3 months. Three things she is to do:  Quit smoking, cut out coffee (with sugar), and increase exercise through walking or [place].  From 09/02/2012 Note: She started out saying that she wants to go to a gastric bypass.   But she had not been seen in our office until last week by Mardelle Matte.  Since we had no chart, Mardelle Matte was not sure what type of lap band she had.  I walked through different thing with her.  For breakfast she has liquids, mainly coffee.  She  seems to drink two 12-16 oz cups, made by her husband.  He adds 4 spoonfuls of sugar to each cup.    She has a [employer] for exercise and has membership at [employer], but admits that she is not doing this.  She also tried water aerobics at [employer], but has not done that in a while.  According to her husband, she is eating within an hour of going to sleep, which is probably not good.  She said that she went to the nutritionist about 8 weeks.ago.  But according to Epic, I see a note from Denny Levy, 01/26/2012, and there is no mention a lap band.  So not sure about the consult and timing. Last visit,  I spent at least 20 minutes talking to her about using the lap band (she already has it) and focusing doing the right things.  And if those don't work, then consider revisional surgery.  Social history: Married. She a 46 yo girl and a 20 yo grandchild (through a step son??) She also has a 81 yo son. She works at [employer] (as she says,  on her butt all day)  PHYSICAL EXAM: BP 134/82  Pulse 84  Resp 18  Ht 5' (1.524 m)  Wt 263 lb 12.8 oz (119.659 kg)  BMI 51.52 kg/m2  General: obese AA F who is alert and generally healthy appearing.  HEENT: Normal. Pupils equal. Good dentition. Abdomen: Soft. No mass. No tenderness. No hernia. Normal bowel sounds.  Port in the RUQ.  DATA REVIEWED: Epic notes   Ovidio Kin, MD, FACS Office:  [phone]

## 2013-04-21 ENCOUNTER — Other Ambulatory Visit: Payer: Self-pay

## 2013-05-19 ENCOUNTER — Encounter: Payer: 59 | Admitting: Women's Health

## 2014-01-19 ENCOUNTER — Other Ambulatory Visit (HOSPITAL_COMMUNITY): Payer: Self-pay | Admitting: Family Medicine

## 2014-01-19 DIAGNOSIS — Z1231 Encounter for screening mammogram for malignant neoplasm of breast: Secondary | ICD-10-CM

## 2014-02-07 ENCOUNTER — Ambulatory Visit (HOSPITAL_COMMUNITY): Payer: 59 | Attending: Family Medicine

## 2014-03-31 ENCOUNTER — Other Ambulatory Visit: Payer: Self-pay

## 2014-06-16 DIAGNOSIS — Z87442 Personal history of urinary calculi: Secondary | ICD-10-CM

## 2014-06-16 HISTORY — DX: Personal history of urinary calculi: Z87.442

## 2015-02-27 DIAGNOSIS — I10 Essential (primary) hypertension: Secondary | ICD-10-CM | POA: Insufficient documentation

## 2015-04-30 ENCOUNTER — Ambulatory Visit (HOSPITAL_COMMUNITY): Payer: Self-pay | Admitting: Clinical

## 2015-05-01 ENCOUNTER — Encounter (HOSPITAL_COMMUNITY): Payer: Self-pay | Admitting: Clinical

## 2015-05-01 ENCOUNTER — Ambulatory Visit (INDEPENDENT_AMBULATORY_CARE_PROVIDER_SITE_OTHER): Payer: BLUE CROSS/BLUE SHIELD | Admitting: Clinical

## 2015-05-01 DIAGNOSIS — F321 Major depressive disorder, single episode, moderate: Secondary | ICD-10-CM
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
## 2015-05-23 ENCOUNTER — Ambulatory Visit (HOSPITAL_COMMUNITY): Payer: Self-pay | Admitting: Clinical
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
## 2015-05-31 ENCOUNTER — Ambulatory Visit (HOSPITAL_COMMUNITY): Payer: Self-pay | Admitting: Clinical

## 2015-06-14 ENCOUNTER — Ambulatory Visit (HOSPITAL_COMMUNITY): Payer: Self-pay | Admitting: Clinical

## 2015-07-04 ENCOUNTER — Ambulatory Visit (HOSPITAL_COMMUNITY): Payer: Self-pay | Admitting: Clinical

## 2016-04-17 ENCOUNTER — Encounter (HOSPITAL_COMMUNITY): Payer: Self-pay

## 2016-05-19 ENCOUNTER — Encounter (HOSPITAL_COMMUNITY): Payer: Self-pay

## 2016-05-19 ENCOUNTER — Emergency Department (HOSPITAL_COMMUNITY): Payer: BLUE CROSS/BLUE SHIELD

## 2016-05-19 ENCOUNTER — Emergency Department (HOSPITAL_COMMUNITY)
Admission: EM | Admit: 2016-05-19 | Discharge: 2016-05-19 | Disposition: A | Discharge status: Home / Self Care | Payer: BLUE CROSS/BLUE SHIELD | Attending: Emergency Medicine | Admitting: Emergency Medicine

## 2016-05-19 DIAGNOSIS — R1031 Right lower quadrant pain: Secondary | ICD-10-CM

## 2016-05-19 DIAGNOSIS — Z79899 Other long term (current) drug therapy: Secondary | ICD-10-CM | POA: Insufficient documentation

## 2016-05-19 DIAGNOSIS — F17210 Nicotine dependence, cigarettes, uncomplicated: Secondary | ICD-10-CM | POA: Insufficient documentation

## 2016-05-19 DIAGNOSIS — E876 Hypokalemia: Secondary | ICD-10-CM

## 2016-05-19 DIAGNOSIS — D259 Leiomyoma of uterus, unspecified: Secondary | ICD-10-CM | POA: Diagnosis not present

## 2016-05-19 DIAGNOSIS — I10 Essential (primary) hypertension: Secondary | ICD-10-CM | POA: Insufficient documentation

## 2016-05-19 LAB — CBC WITH DIFFERENTIAL/PLATELET
BASOS ABS: 0.1 10*3/uL (ref 0.0–0.1)
Basophils Relative: 1 %
Eosinophils Absolute: 0.2 10*3/uL (ref 0.0–0.7)
Eosinophils Relative: 3 %
HEMATOCRIT: 41.2 % (ref 36.0–46.0)
HEMOGLOBIN: 13.8 g/dL (ref 12.0–15.0)
LYMPHS ABS: 4.3 10*3/uL — AB (ref 0.7–4.0)
LYMPHS PCT: 45 %
MCH: 30.3 pg (ref 26.0–34.0)
MCHC: 33.5 g/dL (ref 30.0–36.0)
MCV: 90.4 fL (ref 78.0–100.0)
Monocytes Absolute: 0.6 10*3/uL (ref 0.1–1.0)
Monocytes Relative: 7 %
NEUTROS ABS: 4.1 10*3/uL (ref 1.7–7.7)
Neutrophils Relative %: 44 %
Platelets: 298 10*3/uL (ref 150–400)
RBC: 4.56 MIL/uL (ref 3.87–5.11)
RDW: 13.8 % (ref 11.5–15.5)
WBC: 9.4 10*3/uL (ref 4.0–10.5)

## 2016-05-19 LAB — I-STAT CG4 LACTIC ACID, ED
LACTIC ACID, VENOUS: 0.65 mmol/L (ref 0.5–1.9)
Lactic Acid, Venous: 2.75 mmol/L (ref 0.5–1.9)

## 2016-05-19 LAB — URINALYSIS, ROUTINE W REFLEX MICROSCOPIC
Bilirubin Urine: NEGATIVE
GLUCOSE, UA: NEGATIVE mg/dL
Hgb urine dipstick: NEGATIVE
Ketones, ur: NEGATIVE mg/dL
LEUKOCYTES UA: NEGATIVE
Nitrite: NEGATIVE
PROTEIN: NEGATIVE mg/dL
SPECIFIC GRAVITY, URINE: 1.01 (ref 1.005–1.030)
pH: 7.5 (ref 5.0–8.0)

## 2016-05-19 LAB — COMPREHENSIVE METABOLIC PANEL
ALK PHOS: 65 U/L (ref 38–126)
ALT: 14 U/L (ref 14–54)
AST: 23 U/L (ref 15–41)
Albumin: 3.6 g/dL (ref 3.5–5.0)
Anion gap: 9 (ref 5–15)
BILIRUBIN TOTAL: 0.3 mg/dL (ref 0.3–1.2)
BUN: 9 mg/dL (ref 6–20)
CALCIUM: 8.3 mg/dL — AB (ref 8.9–10.3)
CHLORIDE: 106 mmol/L (ref 101–111)
CO2: 20 mmol/L — ABNORMAL LOW (ref 22–32)
CREATININE: 0.97 mg/dL (ref 0.44–1.00)
Glucose, Bld: 149 mg/dL — ABNORMAL HIGH (ref 65–99)
Potassium: 3.1 mmol/L — ABNORMAL LOW (ref 3.5–5.1)
Sodium: 135 mmol/L (ref 135–145)
TOTAL PROTEIN: 7.1 g/dL (ref 6.5–8.1)

## 2016-05-19 LAB — WET PREP, GENITAL
Sperm: NONE SEEN
TRICH WET PREP: NONE SEEN
WBC WET PREP: NONE SEEN
YEAST WET PREP: NONE SEEN

## 2016-05-19 LAB — I-STAT BETA HCG BLOOD, ED (MC, WL, AP ONLY): I-stat hCG, quantitative: <5 m[IU]/mL (ref <5)

## 2016-05-19 LAB — LIPASE, BLOOD: LIPASE: 17 U/L (ref 11–51)

## 2016-05-19 LAB — GC/CHLAMYDIA PROBE AMP (~~LOC~~) NOT AT ARMC
Chlamydia: NEGATIVE
NEISSERIA GONORRHEA: NEGATIVE

## 2016-05-19 MED ORDER — SODIUM CHLORIDE 0.9 % IV SOLN
Freq: Once | INTRAVENOUS | Status: AC
  Administered 2016-05-19: 03:00:00 via INTRAVENOUS

## 2016-05-19 MED ORDER — HYDROMORPHONE HCL 2 MG/ML IJ SOLN
0.5000 mg | Freq: Once | INTRAMUSCULAR | Status: AC
  Administered 2016-05-19: 0.5 mg via INTRAVENOUS
  Filled 2016-05-19: qty 1

## 2016-05-19 MED ORDER — SODIUM CHLORIDE 0.9 % IV BOLUS (SEPSIS)
1000.0000 mL | Freq: Once | INTRAVENOUS | Status: AC
  Administered 2016-05-19: 1000 mL via INTRAVENOUS

## 2016-05-19 MED ORDER — ONDANSETRON HCL 4 MG/2ML IJ SOLN
4.0000 mg | Freq: Once | INTRAMUSCULAR | Status: AC
  Administered 2016-05-19: 4 mg via INTRAVENOUS
  Filled 2016-05-19: qty 2

## 2016-05-19 MED ORDER — HYDROCODONE-ACETAMINOPHEN 5-325 MG PO TABS: 1.0000 | ORAL_TABLET | Freq: Four times a day (QID) | ORAL | 0 refills | Status: DC | PRN

## 2016-05-19 MED ORDER — ONDANSETRON 8 MG PO TBDP: 8.0000 mg | ORAL_TABLET | Freq: Three times a day (TID) | ORAL | 0 refills | Status: DC | PRN

## 2016-05-19 MED ORDER — POTASSIUM CHLORIDE CRYS ER 20 MEQ PO TBCR
40.0000 meq | EXTENDED_RELEASE_TABLET | Freq: Once | ORAL | Status: AC
  Administered 2016-05-19: 40 meq via ORAL
  Filled 2016-05-19: qty 2

## 2016-05-19 MED ORDER — HYDROMORPHONE HCL 2 MG/ML IJ SOLN
1.0000 mg | Freq: Once | INTRAMUSCULAR | Status: AC
  Administered 2016-05-19: 1 mg via INTRAVENOUS
  Filled 2016-05-19: qty 1

## 2016-05-19 MED ORDER — IOPAMIDOL (ISOVUE-300) INJECTION 61%
INTRAVENOUS | Status: AC
  Administered 2016-05-19: 100 mL via INTRAVENOUS
  Filled 2016-05-19: qty 100

## 2016-05-19 NOTE — ED Triage Notes (Signed)
Sudden onset of RLQ pain x 1 hour. Vomiting x 1.No fevers. Pt received 135mcg fentanyl IN via ems.

## 2016-05-19 NOTE — Discharge Instructions (Signed)
Your ultrasound showed uterine fibroids, otherwise normal. Your CT scan is normal today. Your blood work show slightly low potassium. Make sure to eat potassium rich foods. Please follow up with primary care doctor. Return if worsening symptoms.

## 2016-05-19 NOTE — ED Provider Notes (Signed)
[facility] DEPT Provider Note   CSN: [id number] Arrival date & time: 05/19/16  0218     History   Chief Complaint Chief Complaint  Patient presents with  . Abdominal Pain  . Emesis    HPI CHRISTL ALCARAZ is a 50 y.o. female.  HPI EUDA CREQUE is a 50 y.o. female with history of hypertension, gastric banding, endometrial ablation, presents to emergency department complaining of sudden onset of severe abdominal pain. Patient states about one hour prior to coming in she developed sudden onset of severe right lower quadrant abdominal pain. Patient reports associated nausea and vomiting. Received 100 g of fentanyl by EMS with no relief of pain. Patient denies pain radiation. Denies any urinary symptoms. No vaginal discharge or bleeding. Denies history of similar pain in the past. No changes in bowels. No known fever, chills. No other complaints.   Past Medical History:  Diagnosis Date  . Hypertension   . Obesity   . Sleep apnea     Patient Active Problem List   Diagnosis Date Noted  . History of laparoscopic adjustable gastric banding, APL.  10/23/2008. 09/02/2012  . S/P endometrial ablation 04/22/2012  . DIPLOPIA 05/22/2009  . MERALGIA PARESTHETICA 03/07/2008  . TOBACCO USE DISORDER/SMOKER-SMOKING CESSATION DISCUSSED 09/29/2007  . PRURITUS ANI 09/29/2007  . FECES, ABNORMAL 09/29/2007  . OBESITY, MORBID 07/01/2007  . DEPRESSION 07/01/2007  . OBSTRUCTIVE SLEEP APNEA 07/01/2007  . ELEVATED BP 07/01/2007    Past Surgical History:  Procedure Laterality Date  . BREAST SURGERY    . LAPAROSCOPIC GASTRIC BANDING      OB History    Gravida Para Term Preterm AB Living   0             SAB TAB Ectopic Multiple Live Births                   Home Medications    Prior to Admission medications   Medication Sig Start Date End Date Taking? Authorizing Provider  varenicline (CHANTIX) 1 MG tablet Take 1 mg by mouth 2 (two) times daily.    Historical Provider, MD  zolpidem  (AMBIEN) 5 MG tablet Take 5 mg by mouth at bedtime as needed for sleep.    Historical Provider, MD    Family History Family History  Problem Relation Age of Onset  . Asthma Mother   . Cancer Father   . Drug abuse Father   . Drug abuse Brother   . Drug abuse Brother   . Asthma Other   . Hypertension Other   . Stroke Other     Social History Social History  Substance Use Topics  . Smoking status: Current Every Day Smoker    Packs/day: 0.10    Years: 30.00    Types: Cigarettes  . Smokeless tobacco: Not on file  . Alcohol use No     Allergies   Penicillins   Review of Systems Review of Systems  Constitutional: Negative for chills and fever.  Respiratory: Negative for cough, chest tightness and shortness of breath.   Cardiovascular: Negative for chest pain, palpitations and leg swelling.  Gastrointestinal: Positive for abdominal pain, nausea and vomiting. Negative for diarrhea.  Genitourinary: Negative for dysuria, flank pain, pelvic pain, vaginal bleeding, vaginal discharge and vaginal pain.  Musculoskeletal: Negative for arthralgias, myalgias, neck pain and neck stiffness.  Skin: Negative for rash.  Neurological: Negative for dizziness, weakness and headaches.  All other systems reviewed and are negative.    Physical Exam  Updated Vital Signs BP 135/86 (BP Location: Right Arm)   Pulse 79   Temp 98.1 F (36.7 C) (Oral)   Resp 18   Ht 5' (1.524 m)   Wt 114.3 kg   LMP 04/25/2016 (Approximate)   SpO2 93%   BMI 49.22 kg/m   Physical Exam  Constitutional: She appears well-developed and well-nourished.  Appears to be in severe pain, crying  HENT:  Head: Normocephalic.  Eyes: Conjunctivae are normal.  Neck: Neck supple.  Cardiovascular: Normal rate, regular rhythm and normal heart sounds.   Pulmonary/Chest: Effort normal and breath sounds normal. No respiratory distress. She has no wheezes. She has no rales.  Abdominal: Soft. Bowel sounds are normal. She  exhibits no distension. There is tenderness. There is guarding. There is no rebound.  Diffuse tenderness, worse in RLQ  Genitourinary:  Genitourinary Comments: Normal external genitalia. Normal vaginal canal. Small thin white discharge. Cervix is normal, closed. No CMT.Right adnexal tenderness. No uterine or left adnexal tenderness. No masses palpated.    Musculoskeletal: She exhibits no edema.  Neurological: She is alert.  Skin: Skin is warm and dry.  Psychiatric: She has a normal mood and affect. Her behavior is normal.  Nursing note and vitals reviewed.    ED Treatments / Results  Labs (all labs ordered are listed, but only abnormal results are displayed) Labs Reviewed  WET PREP, GENITAL - Abnormal; Notable for the following:       Result Value   Clue Cells Wet Prep HPF POC PRESENT (*)    All other components within normal limits  CBC WITH DIFFERENTIAL/PLATELET - Abnormal; Notable for the following:    Lymphs Abs 4.3 (*)    All other components within normal limits  COMPREHENSIVE METABOLIC PANEL - Abnormal; Notable for the following:    Potassium 3.1 (*)    CO2 20 (*)    Glucose, Bld 149 (*)    Calcium 8.3 (*)    All other components within normal limits  I-STAT CG4 LACTIC ACID, ED - Abnormal; Notable for the following:    Lactic Acid, Venous 2.75 (*)    All other components within normal limits  LIPASE, BLOOD  URINALYSIS, ROUTINE W REFLEX MICROSCOPIC (NOT AT [facility])  I-STAT BETA HCG BLOOD, ED ([facility], [facility], [facility] ONLY)  I-STAT CG4 LACTIC ACID, ED  GC/CHLAMYDIA PROBE AMP ([place]) NOT AT [facility]    EKG  EKG Interpretation None       Radiology US Transvaginal Non-ob  Result Date: 05/19/2016 CLINICAL DATA:  Sudden onset right lower quadrant pain around midnight EXAM: TRANSABDOMINAL AND TRANSVAGINAL ULTRASOUND OF PELVIS DOPPLER ULTRASOUND OF OVARIES TECHNIQUE: Both transabdominal and transvaginal ultrasound examinations of the pelvis were performed. Transabdominal technique  was performed for global imaging of the pelvis including uterus, ovaries, adnexal regions, and pelvic cul-de-sac. It was necessary to proceed with endovaginal exam following the transabdominal exam to visualize the ovaries. Color and duplex Doppler ultrasound was utilized to evaluate blood flow to the ovaries. COMPARISON:  CT 05/19/2016 FINDINGS: Uterus Measurements: 10.8 x 5.6 x 6.7 cm. There are 2 fibroids, 1 in the anterior midbody measuring 4 cm and the other in the central submucosal region measuring 3 cm. Endometrium Thickness: 5 mm.  No focal abnormality visualized. Right ovary Not visible Left ovary Measurements: 5.5 x 3.8 x 4.0 cm. There are 2 cysts, measuring up to 4 cm. No solid or complex lesions. Pulsed Doppler evaluation of the left ovary demonstrates normal low-resistance arterial and venous waveforms. Other findings  No abnormal free fluid. IMPRESSION: Nonvisualization of the right ovary. Multiple uterine fibroids. Simple cysts of the left ovary. No abnormal pelvic fluid collections. Electronically Signed   By: Andreas Newport M.D.   On: 05/19/2016 05:18   US Pelvis Complete  Result Date: 05/19/2016 CLINICAL DATA:  Sudden onset right lower quadrant pain around midnight EXAM: TRANSABDOMINAL AND TRANSVAGINAL ULTRASOUND OF PELVIS DOPPLER ULTRASOUND OF OVARIES TECHNIQUE: Both transabdominal and transvaginal ultrasound examinations of the pelvis were performed. Transabdominal technique was performed for global imaging of the pelvis including uterus, ovaries, adnexal regions, and pelvic cul-de-sac. It was necessary to proceed with endovaginal exam following the transabdominal exam to visualize the ovaries. Color and duplex Doppler ultrasound was utilized to evaluate blood flow to the ovaries. COMPARISON:  CT 05/19/2016 FINDINGS: Uterus Measurements: 10.8 x 5.6 x 6.7 cm. There are 2 fibroids, 1 in the anterior midbody measuring 4 cm and the other in the central submucosal region measuring 3 cm.  Endometrium Thickness: 5 mm.  No focal abnormality visualized. Right ovary Not visible Left ovary Measurements: 5.5 x 3.8 x 4.0 cm. There are 2 cysts, measuring up to 4 cm. No solid or complex lesions. Pulsed Doppler evaluation of the left ovary demonstrates normal low-resistance arterial and venous waveforms. Other findings No abnormal free fluid. IMPRESSION: Nonvisualization of the right ovary. Multiple uterine fibroids. Simple cysts of the left ovary. No abnormal pelvic fluid collections. Electronically Signed   By: Andreas Newport M.D.   On: 05/19/2016 05:18   Ct Abdomen Pelvis W Contrast  Result Date: 05/19/2016 CLINICAL DATA:  Abdominal pain EXAM: CT ABDOMEN AND PELVIS WITH CONTRAST TECHNIQUE: Multidetector CT imaging of the abdomen and pelvis was performed using the standard protocol following bolus administration of intravenous contrast. CONTRAST:  169ml ISOVUE-300 IOPAMIDOL (ISOVUE-300) INJECTION 61% COMPARISON:  None. FINDINGS: Lower chest: Non consolidative opacities in the bases, possibly atelectatic. Hepatobiliary: No focal liver abnormality is seen. No gallstones, gallbladder wall thickening, or biliary dilatation. Pancreas: Unremarkable. No pancreatic ductal dilatation or surrounding inflammatory changes. Spleen: Normal in size without focal abnormality. Adrenals/Urinary Tract: Adrenal glands are unremarkable. Kidneys are normal, without renal calculi, focal lesion, or hydronephrosis. Bladder is unremarkable. Stomach/Bowel: There are unremarkable appearances of the lap band. Stomach and small bowel are otherwise unremarkable. Appendix is normal. Colon is unremarkable. Vascular/Lymphatic: No significant vascular findings are present. No enlarged abdominal or pelvic lymph nodes. Reproductive: There are several left ovarian cysts measuring up to 4 cm. Uterus and right ovary are unremarkable. Other: No acute inflammatory changes are evident in the abdomen or pelvis. There is no ascites. There is a  small fat containing umbilical hernia. Musculoskeletal: No acute or significant osseous findings. IMPRESSION: Scattered airspace opacities in the lung bases, possibly atelectatic but not characterized. No acute findings are evident in the abdomen or pelvis. Electronically Signed   By: Andreas Newport M.D.   On: 05/19/2016 04:09   [place] Art/ven Flow Abd Pelv Doppler  Result Date: 05/19/2016 CLINICAL DATA:  Sudden onset right lower quadrant pain around midnight EXAM: TRANSABDOMINAL AND TRANSVAGINAL ULTRASOUND OF PELVIS DOPPLER ULTRASOUND OF OVARIES TECHNIQUE: Both transabdominal and transvaginal ultrasound examinations of the pelvis were performed. Transabdominal technique was performed for global imaging of the pelvis including uterus, ovaries, adnexal regions, and pelvic cul-de-sac. It was necessary to proceed with endovaginal exam following the transabdominal exam to visualize the ovaries. Color and duplex Doppler ultrasound was utilized to evaluate blood flow to the ovaries. COMPARISON:  CT 05/19/2016 FINDINGS: Uterus Measurements: 10.8 x 5.6  x 6.7 cm. There are 2 fibroids, 1 in the anterior midbody measuring 4 cm and the other in the central submucosal region measuring 3 cm. Endometrium Thickness: 5 mm.  No focal abnormality visualized. Right ovary Not visible Left ovary Measurements: 5.5 x 3.8 x 4.0 cm. There are 2 cysts, measuring up to 4 cm. No solid or complex lesions. Pulsed Doppler evaluation of the left ovary demonstrates normal low-resistance arterial and venous waveforms. Other findings No abnormal free fluid. IMPRESSION: Nonvisualization of the right ovary. Multiple uterine fibroids. Simple cysts of the left ovary. No abnormal pelvic fluid collections. Electronically Signed   By: Andreas Newport M.D.   On: 05/19/2016 05:18    Procedures Procedures (including critical care time)  Medications Ordered in ED Medications  sodium chloride 0.9 % bolus 1,000 mL (not administered)  HYDROmorphone  (DILAUDID) injection 0.5 mg (not administered)  HYDROmorphone (DILAUDID) injection 1 mg (1 mg Intravenous Given 05/19/16 0253)  ondansetron (ZOFRAN) injection 4 mg (4 mg Intravenous Given 05/19/16 0253)  0.9 %  sodium chloride infusion ( Intravenous New Bag/Given 05/19/16 0253)  iopamidol (ISOVUE-300) 61 % injection (100 mLs Intravenous Contrast Given 05/19/16 0351)     Initial Impression / Assessment and Plan / ED Course  I have reviewed the triage vital signs and the nursing notes.  Pertinent labs & imaging results that were available during my care of the patient were reviewed by me and considered in my medical decision making (see chart for details).  Clinical Course    Pt with acute onset of severe lower abdominal pain, worse in right lower quadrant. Pt is crying, appears to be in severe pain. Will check labs, try pain medications, anti emetics, CT abd and pelvis.   4:23 AM Pain improved after IV Dilaudid, but is now coming back. CT is negative. Patient continues to have pain, will get ultrasound pelvis to rule out ovarian cyst or torsion.  5:49 AM [place] negative. Pt continues to have pain. Pelvic exam performed, right adnexal tenderness otherwise unremarkable.   6:55 AM Wet prep and UA unremarkable. Pt feels better. Question passed stone vs ruptured ovarian cyst. Will dc home with some pain medications and antiemetics. Close pcp follow up. Return precautions discussed.   Vitals:   05/19/16 0407 05/19/16 0415 05/19/16 0600 05/19/16 0654  BP: 135/86 127/81 129/68 126/81  Pulse: 79 72 78 67  Resp: 18   18  Temp:    97.5 F (36.4 C)  TempSrc:    Oral  SpO2: 93% 100% 96% 93%  Weight:      Height:           Final Clinical Impressions(s) / ED Diagnoses   Final diagnoses:  Right lower quadrant abdominal pain  Hypokalemia  Uterine leiomyoma, unspecified location    New Prescriptions New Prescriptions   HYDROCODONE-ACETAMINOPHEN (NORCO) 5-325 MG TABLET    Take 1 tablet by  mouth every 6 (six) hours as needed for moderate pain.   ONDANSETRON (ZOFRAN ODT) 8 MG DISINTEGRATING TABLET    Take 1 tablet (8 mg total) by mouth every 8 (eight) hours as needed for nausea or vomiting.     Jeannett Senior, PA-C 05/19/16 [place], MD 05/19/16 [phone]

## 2016-11-04 ENCOUNTER — Ambulatory Visit (HOSPITAL_COMMUNITY): Payer: BLUE CROSS/BLUE SHIELD

## 2016-11-04 ENCOUNTER — Emergency Department (HOSPITAL_COMMUNITY): Payer: BLUE CROSS/BLUE SHIELD

## 2016-11-04 ENCOUNTER — Emergency Department (HOSPITAL_COMMUNITY)
Admission: EM | Admit: 2016-11-04 | Discharge: 2016-11-04 | Disposition: A | Discharge status: Home / Self Care | Payer: BLUE CROSS/BLUE SHIELD | Attending: Emergency Medicine | Admitting: Emergency Medicine

## 2016-11-04 ENCOUNTER — Encounter (HOSPITAL_COMMUNITY): Payer: Self-pay | Admitting: *Deleted

## 2016-11-04 DIAGNOSIS — I10 Essential (primary) hypertension: Secondary | ICD-10-CM | POA: Insufficient documentation

## 2016-11-04 DIAGNOSIS — N83201 Unspecified ovarian cyst, right side: Secondary | ICD-10-CM | POA: Insufficient documentation

## 2016-11-04 DIAGNOSIS — D259 Leiomyoma of uterus, unspecified: Secondary | ICD-10-CM | POA: Diagnosis not present

## 2016-11-04 DIAGNOSIS — R1031 Right lower quadrant pain: Secondary | ICD-10-CM

## 2016-11-04 DIAGNOSIS — Z79899 Other long term (current) drug therapy: Secondary | ICD-10-CM | POA: Insufficient documentation

## 2016-11-04 DIAGNOSIS — F17210 Nicotine dependence, cigarettes, uncomplicated: Secondary | ICD-10-CM | POA: Insufficient documentation

## 2016-11-04 LAB — CBC WITH DIFFERENTIAL/PLATELET
BASOS ABS: 0.1 10*3/uL (ref 0.0–0.1)
BASOS PCT: 1 %
Eosinophils Absolute: 0.2 10*3/uL (ref 0.0–0.7)
Eosinophils Relative: 2 %
HEMATOCRIT: 41.9 % (ref 36.0–46.0)
HEMOGLOBIN: 13.7 g/dL (ref 12.0–15.0)
Lymphocytes Relative: 42 %
Lymphs Abs: 3.7 10*3/uL (ref 0.7–4.0)
MCH: 30 pg (ref 26.0–34.0)
MCHC: 32.7 g/dL (ref 30.0–36.0)
MCV: 91.7 fL (ref 78.0–100.0)
Monocytes Absolute: 0.6 10*3/uL (ref 0.1–1.0)
Monocytes Relative: 7 %
NEUTROS ABS: 4.2 10*3/uL (ref 1.7–7.7)
NEUTROS PCT: 48 %
Platelets: 316 10*3/uL (ref 150–400)
RBC: 4.57 MIL/uL (ref 3.87–5.11)
RDW: 14.3 % (ref 11.5–15.5)
WBC: 8.8 10*3/uL (ref 4.0–10.5)

## 2016-11-04 LAB — COMPREHENSIVE METABOLIC PANEL
ALBUMIN: 3.3 g/dL — AB (ref 3.5–5.0)
ALT: 15 U/L (ref 14–54)
AST: 16 U/L (ref 15–41)
Alkaline Phosphatase: 68 U/L (ref 38–126)
Anion gap: 7 (ref 5–15)
BILIRUBIN TOTAL: 1 mg/dL (ref 0.3–1.2)
BUN: 7 mg/dL (ref 6–20)
CO2: 24 mmol/L (ref 22–32)
Calcium: 8.4 mg/dL — ABNORMAL LOW (ref 8.9–10.3)
Chloride: 107 mmol/L (ref 101–111)
Creatinine, Ser: 0.71 mg/dL (ref 0.44–1.00)
GFR calc Af Amer: >60 mL/min (ref >60)
GFR calc non Af Amer: >60 mL/min (ref >60)
GLUCOSE: 103 mg/dL — AB (ref 65–99)
POTASSIUM: 3.7 mmol/L (ref 3.5–5.1)
SODIUM: 138 mmol/L (ref 135–145)
TOTAL PROTEIN: 6.7 g/dL (ref 6.5–8.1)

## 2016-11-04 LAB — URINALYSIS, ROUTINE W REFLEX MICROSCOPIC
BILIRUBIN URINE: NEGATIVE
Glucose, UA: NEGATIVE mg/dL
Hgb urine dipstick: NEGATIVE
KETONES UR: NEGATIVE mg/dL
LEUKOCYTES UA: NEGATIVE
NITRITE: NEGATIVE
PH: 7 (ref 5.0–8.0)
Protein, ur: NEGATIVE mg/dL
SPECIFIC GRAVITY, URINE: 1.016 (ref 1.005–1.030)

## 2016-11-04 LAB — I-STAT BETA HCG BLOOD, ED (MC, WL, AP ONLY): I-stat hCG, quantitative: <5 m[IU]/mL (ref <5)

## 2016-11-04 LAB — LIPASE, BLOOD: Lipase: 23 U/L (ref 11–51)

## 2016-11-04 MED ORDER — KETOROLAC TROMETHAMINE 15 MG/ML IJ SOLN
15.0000 mg | Freq: Once | INTRAMUSCULAR | Status: AC
  Administered 2016-11-04: 15 mg via INTRAVENOUS
  Filled 2016-11-04: qty 1

## 2016-11-04 MED ORDER — KETOROLAC TROMETHAMINE 30 MG/ML IJ SOLN
30.0000 mg | Freq: Once | INTRAMUSCULAR | Status: AC
  Administered 2016-11-04: 30 mg via INTRAVENOUS
  Filled 2016-11-04: qty 1

## 2016-11-04 MED ORDER — SODIUM CHLORIDE 0.9 % IV BOLUS (SEPSIS)
1000.0000 mL | Freq: Once | INTRAVENOUS | Status: AC
  Administered 2016-11-04: 1000 mL via INTRAVENOUS

## 2016-11-04 MED ORDER — TRAMADOL HCL 50 MG PO TABS
50.0000 mg | ORAL_TABLET | Freq: Once | ORAL | Status: AC
  Administered 2016-11-04: 50 mg via ORAL
  Filled 2016-11-04: qty 1

## 2016-11-04 NOTE — ED Notes (Signed)
Pt transported to US

## 2016-11-04 NOTE — ED Notes (Signed)
Family at bedside. 

## 2016-11-04 NOTE — ED Triage Notes (Signed)
Patient c/o lower abd. Pain onset Sat. , states she is also upset she just heard some bad news this am her neighbor died.

## 2016-11-04 NOTE — ED Provider Notes (Signed)
[facility] DEPT Provider Note   CSN: [id number] Arrival date & time: 11/04/16  0645     History   Chief Complaint Chief Complaint  Patient presents with  . Abdominal Pain    HPI KENIDEE CREGAN is a 51 y.o. female.  HPI   51 year old female presents with right lower quadrant pain. Reports the symptoms began 3 days ago as an ache, however worsened significantly last night. Reports that the pain is sharp at times. It is located in the right lower quadrant. Also reports she's had right lower back pain and an aching in her right leg. No fevers. Denies nausea, vomiting. Reports lightheadedness. No vaginal bleeding or discharge. Last period was in March. Has a history of ablation. Denies constipation or diarrhea. Reports her appetite has been down, however she's also been grieving the loss of a friend.  Past Medical History:  Diagnosis Date  . Hypertension   . Obesity   . Sleep apnea     Patient Active Problem List   Diagnosis Date Noted  . History of laparoscopic adjustable gastric banding, APL.  10/23/2008. 09/02/2012  . S/P endometrial ablation 04/22/2012  . DIPLOPIA 05/22/2009  . MERALGIA PARESTHETICA 03/07/2008  . TOBACCO USE DISORDER/SMOKER-SMOKING CESSATION DISCUSSED 09/29/2007  . PRURITUS ANI 09/29/2007  . FECES, ABNORMAL 09/29/2007  . OBESITY, MORBID 07/01/2007  . DEPRESSION 07/01/2007  . OBSTRUCTIVE SLEEP APNEA 07/01/2007  . ELEVATED BP 07/01/2007    Past Surgical History:  Procedure Laterality Date  . BREAST SURGERY    . LAPAROSCOPIC GASTRIC BANDING      OB History    Gravida Para Term Preterm AB Living   0             SAB TAB Ectopic Multiple Live Births                   Home Medications    Prior to Admission medications   Medication Sig Start Date End Date Taking? Authorizing Provider  POTASSIUM PO Take 1 tablet by mouth daily.   Yes [provider]  Tetrahydrozoline HCl (VISINE OP) Place 2 drops into both eyes as needed (for dry  eyes).   Yes [provider]    Family History Family History  Problem Relation Age of Onset  . Asthma Mother   . Cancer Father   . Drug abuse Father   . Drug abuse Brother   . Drug abuse Brother   . Asthma Other   . Hypertension Other   . Stroke Other     Social History Social History  Substance Use Topics  . Smoking status: Current Every Day Smoker    Packs/day: 0.10    Years: 30.00    Types: Cigarettes  . Smokeless tobacco: Never Used  . Alcohol use No     Allergies   Penicillins   Review of Systems Review of Systems  Constitutional: Negative for fever.  HENT: Negative for sore throat.   Eyes: Negative for visual disturbance.  Respiratory: Negative for cough and shortness of breath.   Cardiovascular: Negative for chest pain.  Gastrointestinal: Positive for abdominal pain. Negative for constipation, diarrhea, nausea and vomiting.  Genitourinary: Positive for urgency. Negative for difficulty urinating and dysuria.  Musculoskeletal: Positive for back pain. Negative for neck pain.  Skin: Negative for rash.  Neurological: Negative for syncope and headaches.     Physical Exam Updated Vital Signs BP (!) 112/95   Pulse 67   Temp 98.2 F (36.8 C)   Resp  16   Ht 5' (1.524 m)   Wt 116.6 kg (257 lb)   SpO2 96%   BMI 50.19 kg/m   Physical Exam  Constitutional: She is oriented to person, place, and time. She appears well-developed and well-nourished. No distress.  HENT:  Head: Normocephalic and atraumatic.  Eyes: Conjunctivae and EOM are normal.  Neck: Normal range of motion.  Cardiovascular: Normal rate, regular rhythm, normal heart sounds and intact distal pulses.  Exam reveals no gallop and no friction rub.   No murmur heard. Pulmonary/Chest: Effort normal and breath sounds normal. No respiratory distress. She has no wheezes. She has no rales.  Abdominal: Soft. She exhibits no distension. There is tenderness. There is no guarding.    Musculoskeletal: She exhibits no edema.       Lumbar back: She exhibits tenderness (right). She exhibits no bony tenderness.  Neurological: She is alert and oriented to person, place, and time. She has normal strength. No sensory deficit.  Skin: Skin is warm and dry. No rash noted. She is not diaphoretic. No erythema.  Nursing note and vitals reviewed.    ED Treatments / Results  Labs (all labs ordered are listed, but only abnormal results are displayed) Labs Reviewed  COMPREHENSIVE METABOLIC PANEL - Abnormal; Notable for the following:       Result Value   Glucose, Bld 103 (*)    Calcium 8.4 (*)    Albumin 3.3 (*)    All other components within normal limits  CBC WITH DIFFERENTIAL/PLATELET  LIPASE, BLOOD  URINALYSIS, ROUTINE W REFLEX MICROSCOPIC  I-STAT BETA HCG BLOOD, ED ([facility], [facility], [facility] ONLY)    EKG  EKG Interpretation None       Radiology US Transvaginal Non-ob  Result Date: 11/04/2016 CLINICAL DATA:  Right lower quadrant pain EXAM: TRANSABDOMINAL AND TRANSVAGINAL ULTRASOUND OF PELVIS DOPPLER ULTRASOUND OF OVARIES TECHNIQUE: Both transabdominal and transvaginal ultrasound examinations of the pelvis were performed. Transabdominal technique was performed for global imaging of the pelvis including uterus, ovaries, adnexal regions, and pelvic cul-de-sac. It was necessary to proceed with endovaginal exam following the transabdominal exam to visualize the ovaries and endometrium. Color and duplex Doppler ultrasound was utilized to evaluate blood flow to the ovaries. COMPARISON:  CT abdomen 11/04/2016, 05/19/2016 FINDINGS: Uterus Measurements: 11.2 x 6.4 x 5.8 cm. Three hypoechoic masses within the uterus most consistent with uterine fibroids. Anterior uterine fundal fibroid measures 4.6 x 3.7 x 4.2 cm. Two posterior uterine fibroids measure 3.3 x 3 x 3.4 cm and 1.6 x 1.4 x 1.6 cm respectively. Endometrium Thickness: 7.4 mm.  No focal abnormality visualized. Right ovary Measurements: 6.4  x 4.7 x 5.6 cm. 3.6 x 3 x 4.3 cm anechoic right ovarian mass with increased through transmission, most consistent with a cysts. Left ovary Measurements: 6.5 x 4.4 x 5.5 cm. Multiple small cystic areas collectively measuring 5.3 x 3.5 x 4.9 cm unchanged compared with 05/19/2016. Pulsed Doppler evaluation of both ovaries demonstrates normal low-resistance arterial and venous waveforms. Other findings No abnormal free fluid. IMPRESSION: 1. No ovarian torsion. 2. Multiple uterine fibroids as described above. 3. Right ovarian cyst measuring 3.6 x 3 x 4.3 cm. 4. Multiple small cystic areas collectively measuring 5.3 x 3.5 x 4.9 cm unchanged compared with 05/19/2016. Recommend either follow-up ultrasound in 6 months or MRI to document stability. Electronically Signed   By: Kathreen Devoid   On: 11/04/2016 11:38   US Pelvis Complete  Result Date: 11/04/2016 CLINICAL DATA:  Right lower quadrant pain  EXAM: TRANSABDOMINAL AND TRANSVAGINAL ULTRASOUND OF PELVIS DOPPLER ULTRASOUND OF OVARIES TECHNIQUE: Both transabdominal and transvaginal ultrasound examinations of the pelvis were performed. Transabdominal technique was performed for global imaging of the pelvis including uterus, ovaries, adnexal regions, and pelvic cul-de-sac. It was necessary to proceed with endovaginal exam following the transabdominal exam to visualize the ovaries and endometrium. Color and duplex Doppler ultrasound was utilized to evaluate blood flow to the ovaries. COMPARISON:  CT abdomen 11/04/2016, 05/19/2016 FINDINGS: Uterus Measurements: 11.2 x 6.4 x 5.8 cm. Three hypoechoic masses within the uterus most consistent with uterine fibroids. Anterior uterine fundal fibroid measures 4.6 x 3.7 x 4.2 cm. Two posterior uterine fibroids measure 3.3 x 3 x 3.4 cm and 1.6 x 1.4 x 1.6 cm respectively. Endometrium Thickness: 7.4 mm.  No focal abnormality visualized. Right ovary Measurements: 6.4 x 4.7 x 5.6 cm. 3.6 x 3 x 4.3 cm anechoic right ovarian mass with  increased through transmission, most consistent with a cysts. Left ovary Measurements: 6.5 x 4.4 x 5.5 cm. Multiple small cystic areas collectively measuring 5.3 x 3.5 x 4.9 cm unchanged compared with 05/19/2016. Pulsed Doppler evaluation of both ovaries demonstrates normal low-resistance arterial and venous waveforms. Other findings No abnormal free fluid. IMPRESSION: 1. No ovarian torsion. 2. Multiple uterine fibroids as described above. 3. Right ovarian cyst measuring 3.6 x 3 x 4.3 cm. 4. Multiple small cystic areas collectively measuring 5.3 x 3.5 x 4.9 cm unchanged compared with 05/19/2016. Recommend either follow-up ultrasound in 6 months or MRI to document stability. Electronically Signed   By: Kathreen Devoid   On: 11/04/2016 11:38   [place] Art/ven Flow Abd Pelv Doppler  Result Date: 11/04/2016 CLINICAL DATA:  Right lower quadrant pain EXAM: TRANSABDOMINAL AND TRANSVAGINAL ULTRASOUND OF PELVIS DOPPLER ULTRASOUND OF OVARIES TECHNIQUE: Both transabdominal and transvaginal ultrasound examinations of the pelvis were performed. Transabdominal technique was performed for global imaging of the pelvis including uterus, ovaries, adnexal regions, and pelvic cul-de-sac. It was necessary to proceed with endovaginal exam following the transabdominal exam to visualize the ovaries and endometrium. Color and duplex Doppler ultrasound was utilized to evaluate blood flow to the ovaries. COMPARISON:  CT abdomen 11/04/2016, 05/19/2016 FINDINGS: Uterus Measurements: 11.2 x 6.4 x 5.8 cm. Three hypoechoic masses within the uterus most consistent with uterine fibroids. Anterior uterine fundal fibroid measures 4.6 x 3.7 x 4.2 cm. Two posterior uterine fibroids measure 3.3 x 3 x 3.4 cm and 1.6 x 1.4 x 1.6 cm respectively. Endometrium Thickness: 7.4 mm.  No focal abnormality visualized. Right ovary Measurements: 6.4 x 4.7 x 5.6 cm. 3.6 x 3 x 4.3 cm anechoic right ovarian mass with increased through transmission, most consistent with a  cysts. Left ovary Measurements: 6.5 x 4.4 x 5.5 cm. Multiple small cystic areas collectively measuring 5.3 x 3.5 x 4.9 cm unchanged compared with 05/19/2016. Pulsed Doppler evaluation of both ovaries demonstrates normal low-resistance arterial and venous waveforms. Other findings No abnormal free fluid. IMPRESSION: 1. No ovarian torsion. 2. Multiple uterine fibroids as described above. 3. Right ovarian cyst measuring 3.6 x 3 x 4.3 cm. 4. Multiple small cystic areas collectively measuring 5.3 x 3.5 x 4.9 cm unchanged compared with 05/19/2016. Recommend either follow-up ultrasound in 6 months or MRI to document stability. Electronically Signed   By: Kathreen Devoid   On: 11/04/2016 11:38   Ct Renal Stone Study  Result Date: 11/04/2016 CLINICAL DATA:  Low abdominal pain radiating to the right flank since Sunday. EXAM: CT ABDOMEN AND PELVIS WITHOUT  CONTRAST TECHNIQUE: Multidetector CT imaging of the abdomen and pelvis was performed following the standard protocol without IV contrast. COMPARISON:  05/19/2016 FINDINGS: Lower chest:  No acute finding. Hepatobiliary: No focal liver abnormality.No evidence of biliary obstruction or stone. Pancreas: Unremarkable. Spleen: Unremarkable. Adrenals/Urinary Tract: Negative adrenals. No hydronephrosis or stone. Unremarkable bladder. Stomach/Bowel: Lap band in stable and unremarkable position. No obstruction or inflammation noted. Negative appendix Vascular/Lymphatic: No acute vascular abnormality. No mass or adenopathy. Reproductive:Enlarged uterus due to fibroids; cannot individually identify and measure fibroids on this noncontrast study. Chronic multi cystic appearance of the left ovary. Right ovarian cyst that is interval, ~4 cm. Mild reticulation of fat around the right adnexa. Other: No ascites or pneumoperitoneum. Musculoskeletal: Prominent facet arthropathy.  Spondylosis. IMPRESSION: 1. ~4 cm right ovarian cyst with mild surrounding stranding. Consider sonographic  characterization given ipsilateral pain. 2. No hydronephrosis or urolithiasis. 3. Chronic multi cystic enlargement of the left ovary. Fibroid uterus. 4. Lap band in stable unremarkable position. Electronically Signed   By: Monte Fantasia M.D.   On: 11/04/2016 08:42    Procedures Procedures (including critical care time)  Medications Ordered in ED Medications  sodium chloride 0.9 % bolus 1,000 mL (0 mLs Intravenous Stopped 11/04/16 1303)  ketorolac (TORADOL) 30 MG/ML injection 30 mg (30 mg Intravenous Given 11/04/16 0749)  traMADol (ULTRAM) tablet 50 mg (50 mg Oral Given 11/04/16 1302)  ketorolac (TORADOL) 15 MG/ML injection 15 mg (15 mg Intravenous Given 11/04/16 1302)     Initial Impression / Assessment and Plan / ED Course  I have reviewed the triage vital signs and the nursing notes.  Pertinent labs & imaging results that were available during my care of the patient were reviewed by me and considered in my medical decision making (see chart for details).     51 year old female with a history of hypertension, gastric bypass surgery presents with concern for right lower quadrant pain.  Differential diagnosis includes ovarian cyst, ovarian torsion, appendicitis, nephrolithiasis, other.Printed test negative.  CT stone study shows 4cm ovarian cyst with stranding. Urinalysis WNL.  Labs without acute findings. Pain improved with toradol.   Ordered TVUS to evaluate ovarian cyst/eval for torsion.   TVUS shows ovarian cysts, no sign of torsion. Recommend Tylenol and NSAIDs for pain.  Recommend follow-up ultrasound in 6 months and primary care physician follow-up.  Final Clinical Impressions(s) / ED Diagnoses   Final diagnoses:  Right lower quadrant pain  Cyst of right ovary  Uterine leiomyoma, unspecified location    New Prescriptions Discharge Medication List as of 11/04/2016 12:32 PM       Gareth Morgan, MD 11/04/16 1905

## 2016-11-04 NOTE — Discharge Instructions (Addendum)
Take tylenol 1000mg  4 times daily, ibuprofen 400mg  4-6 times daily.

## 2016-12-03 ENCOUNTER — Ambulatory Visit: Payer: BLUE CROSS/BLUE SHIELD | Admitting: Obstetrics and Gynecology

## 2017-01-28 ENCOUNTER — Inpatient Hospital Stay (HOSPITAL_COMMUNITY)
Admission: AD | Admit: 2017-01-28 | Discharge: 2017-01-28 | Disposition: A | Discharge status: Home / Self Care | Payer: BLUE CROSS/BLUE SHIELD | Source: Ambulatory Visit | Attending: Family Medicine | Admitting: Family Medicine

## 2017-01-28 ENCOUNTER — Encounter (HOSPITAL_COMMUNITY): Payer: Self-pay | Admitting: *Deleted

## 2017-01-28 DIAGNOSIS — N83201 Unspecified ovarian cyst, right side: Secondary | ICD-10-CM

## 2017-01-28 DIAGNOSIS — Z880 Allergy status to penicillin: Secondary | ICD-10-CM | POA: Insufficient documentation

## 2017-01-28 DIAGNOSIS — N760 Acute vaginitis: Secondary | ICD-10-CM | POA: Insufficient documentation

## 2017-01-28 DIAGNOSIS — F17210 Nicotine dependence, cigarettes, uncomplicated: Secondary | ICD-10-CM | POA: Diagnosis not present

## 2017-01-28 DIAGNOSIS — D259 Leiomyoma of uterus, unspecified: Secondary | ICD-10-CM | POA: Diagnosis not present

## 2017-01-28 DIAGNOSIS — R109 Unspecified abdominal pain: Secondary | ICD-10-CM | POA: Diagnosis present

## 2017-01-28 DIAGNOSIS — N83202 Unspecified ovarian cyst, left side: Secondary | ICD-10-CM

## 2017-01-28 DIAGNOSIS — B9689 Other specified bacterial agents as the cause of diseases classified elsewhere: Secondary | ICD-10-CM | POA: Diagnosis not present

## 2017-01-28 LAB — URINALYSIS, ROUTINE W REFLEX MICROSCOPIC
Bilirubin Urine: NEGATIVE
Glucose, UA: NEGATIVE mg/dL
HGB URINE DIPSTICK: NEGATIVE
KETONES UR: 5 mg/dL — AB
NITRITE: NEGATIVE
PROTEIN: 100 mg/dL — AB
Specific Gravity, Urine: 1.02 (ref 1.005–1.030)
pH: 6 (ref 5.0–8.0)

## 2017-01-28 LAB — WET PREP, GENITAL
Sperm: NONE SEEN
Trich, Wet Prep: NONE SEEN
Yeast Wet Prep HPF POC: NONE SEEN

## 2017-01-28 LAB — POCT PREGNANCY, URINE: PREG TEST UR: NEGATIVE

## 2017-01-28 MED ORDER — OXYCODONE-ACETAMINOPHEN 5-325 MG PO TABS
1.0000 | ORAL_TABLET | Freq: Once | ORAL | Status: AC
  Administered 2017-01-28: 2 via ORAL
  Filled 2017-01-28: qty 2

## 2017-01-28 MED ORDER — METRONIDAZOLE 500 MG PO TABS: 500.0000 mg | ORAL_TABLET | Freq: Two times a day (BID) | ORAL | 0 refills | Status: DC

## 2017-01-28 MED ORDER — TRAMADOL HCL 50 MG PO TABS: 50.0000 mg | ORAL_TABLET | Freq: Four times a day (QID) | ORAL | 0 refills | Status: DC | PRN

## 2017-01-28 NOTE — MAU Note (Signed)
Pt states she has a Hx of fibroids and cysts, has intermittent "stabbing" abdominal pain located in RLQ , rates pain 9 on 0/10 scale.  Denies vaginal bleeding.  Pt reports periods are irregular, LMP 7/718, first period in 6 months.

## 2017-01-28 NOTE — Discharge Instructions (Signed)

## 2017-01-28 NOTE — MAU Note (Signed)
Having sharp pains in lower abd. Pain is getting worse.  Has been to [facility] twice for this.  Was told fibroids and a cyst.  Having pains in upper thighs when it come. Has been happening for a long time,  Last 8 months has gotten worse.  Had appt with GYN, was unable to make apptl

## 2017-01-28 NOTE — MAU Provider Note (Signed)
  History     CSN: [id number]  Arrival date and time: 01/28/17 1116   First Provider Initiated Contact with Patient 01/28/17 1236      Chief Complaint  Patient presents with  . Abdominal Pain   HPI  51 yo with known fibroid uterus here for the evaluation of abdominal pain. Patient has had several ED visit with the same complaint, her last one being in May. Patient describes the pain as unchanged from previous episodes. The pain is localized in her lower abdomen. She has taken aleeve earlier today. She was scheduled to meet with Dr. Rip Harbour (GYN) but missed her appointment. She is requesting a hysterectomy. She had a normal period in July and has skipped several periods before that. She denies any abnormal discharge. She reports a good appetite   Past Medical History:  Diagnosis Date  . Hypertension   . Obesity   . Sleep apnea     Past Surgical History:  Procedure Laterality Date  . BREAST SURGERY    . LAPAROSCOPIC GASTRIC BANDING      Family History  Problem Relation Age of Onset  . Asthma Mother   . Cancer Father   . Drug abuse Father   . Drug abuse Brother   . Diabetes Brother   . Drug abuse Brother   . Heart attack Brother   . Asthma Other   . Hypertension Other   . Stroke Other     Social History  Substance Use Topics  . Smoking status: Current Every Day Smoker    Packs/day: 0.10    Years: 30.00    Types: Cigarettes  . Smokeless tobacco: Never Used  . Alcohol use No    Allergies:  Allergies  Allergen Reactions  . Penicillins Other (See Comments)    Yeast infection Has patient had a PCN reaction causing immediate rash, facial/tongue/throat swelling, SOB or lightheadedness with hypotension: No Has patient had a PCN reaction causing severe rash involving mucus membranes or skin necrosis: No Has patient had a PCN reaction that required hospitalization: Unknown Has patient had a PCN reaction occurring within the last 10 years: No If all of the above answers  are "NO", then may proceed with Cephalosporin use.    Prescriptions Prior to Admission  Medication Sig Dispense Refill Last Dose  . naproxen sodium (ANAPROX) 220 MG tablet Take 440 mg by mouth 2 (two) times daily with a meal.   01/28/2017 at Unknown time    Review of Systems  See pertinent in HPI Physical Exam   Blood pressure (!) 141/78, pulse 76, temperature 98.7 F (37.1 C), temperature source Oral, resp. rate 20, last menstrual period 12/20/2016, SpO2 100 %.  Physical Exam GENERAL: Well-developed, well-nourished female in no acute distress.  HEENT: Normocephalic, atraumatic. Sclerae anicteric.  LUNGS: Clear to auscultation bilaterally.  HEART: Regular rate and rhythm. ABDOMEN: Soft, tenderness in lower abdomen, nondistended.  PELVIC: Normal external female genitalia. Vagina is pink and rugated.  Normal discharge. Normal appearing cervix. Uterus is 14-weeks in size. No adnexal mass. Tenderness throughout the lower pelvis. EXTREMITIES: No cyanosis, clubbing, or edema, 2+ distal pulses.  MAU Course  Procedures  MDM Wet prep: positive clue cells  Assessment and Plan  51 yo with symptomatic fibroid uterus and BV - Discussed need to follow up with GYN to discuss management plan - Rx tramadol provided - outpatient ultrasound ordered to assess ovarian cysts - Rx Flagyl provided for BV - Precautions reviewed  Devine Klingel 01/28/2017, 1:42 PM

## 2017-02-11 ENCOUNTER — Ambulatory Visit (HOSPITAL_COMMUNITY)
Admission: RE | Admit: 2017-02-11 | Discharge: 2017-02-11 | Disposition: A | Discharge status: Home / Self Care | Payer: BLUE CROSS/BLUE SHIELD | Source: Ambulatory Visit | Attending: Obstetrics and Gynecology | Admitting: Obstetrics and Gynecology

## 2017-02-11 DIAGNOSIS — N83202 Unspecified ovarian cyst, left side: Secondary | ICD-10-CM | POA: Diagnosis not present

## 2017-02-11 DIAGNOSIS — D259 Leiomyoma of uterus, unspecified: Secondary | ICD-10-CM | POA: Diagnosis not present

## 2017-02-11 DIAGNOSIS — N83201 Unspecified ovarian cyst, right side: Secondary | ICD-10-CM | POA: Diagnosis not present

## 2017-02-13 ENCOUNTER — Telehealth: Payer: Self-pay

## 2017-02-13 NOTE — Telephone Encounter (Signed)
Attempted to contact about u/s results and appt, no answer, left vm to call.

## 2017-03-09 ENCOUNTER — Ambulatory Visit (INDEPENDENT_AMBULATORY_CARE_PROVIDER_SITE_OTHER): Payer: BLUE CROSS/BLUE SHIELD | Admitting: Obstetrics and Gynecology

## 2017-03-09 ENCOUNTER — Encounter: Payer: Self-pay | Admitting: Obstetrics and Gynecology

## 2017-03-09 DIAGNOSIS — D251 Intramural leiomyoma of uterus: Secondary | ICD-10-CM

## 2017-03-09 DIAGNOSIS — D252 Subserosal leiomyoma of uterus: Secondary | ICD-10-CM | POA: Diagnosis not present

## 2017-03-09 DIAGNOSIS — D250 Submucous leiomyoma of uterus: Secondary | ICD-10-CM | POA: Diagnosis not present

## 2017-03-09 DIAGNOSIS — N83209 Unspecified ovarian cyst, unspecified side: Secondary | ICD-10-CM | POA: Insufficient documentation

## 2017-03-09 DIAGNOSIS — N83202 Unspecified ovarian cyst, left side: Secondary | ICD-10-CM

## 2017-03-09 DIAGNOSIS — D219 Benign neoplasm of connective and other soft tissue, unspecified: Secondary | ICD-10-CM | POA: Insufficient documentation

## 2017-03-09 NOTE — Progress Notes (Signed)
Pt was seen at [facility] and had U/S ordered.  Pt is here today for consult / results. Pt would like to discuss ?need for surgery.

## 2017-03-09 NOTE — Patient Instructions (Signed)
Ovarian Cyst An ovarian cyst is a fluid-filled sac that forms on an ovary. The ovaries are small organs that produce eggs in women. Various types of cysts can form on the ovaries. Some may cause symptoms and require treatment. Most ovarian cysts go away on their own, are not cancerous (are benign), and do not cause problems. Common types of ovarian cysts include:  Functional (follicle) cysts. ? Occur during the menstrual cycle, and usually go away with the next menstrual cycle if you do not get pregnant. ? Usually cause no symptoms.  Endometriomas. ? Are cysts that form from the tissue that lines the uterus (endometrium). ? Are sometimes called "chocolate cysts" because they become filled with blood that turns brown. ? Can cause pain in the lower abdomen during intercourse and during your period.  Cystadenoma cysts. ? Develop from cells on the outside surface of the ovary. ? Can get very large and cause lower abdomen pain and pain with intercourse. ? Can cause severe pain if they twist or break open (rupture).  Dermoid cysts. ? Are sometimes found in both ovaries. ? May contain different kinds of body tissue, such as skin, teeth, hair, or cartilage. ? Usually do not cause symptoms unless they get very big.  Theca lutein cysts. ? Occur when too much of a certain hormone (human chorionic gonadotropin) is produced and overstimulates the ovaries to produce an egg. ? Are most common after having procedures used to assist with the conception of a baby (in vitro fertilization).  What are the causes? Ovarian cysts may be caused by:  Ovarian hyperstimulation syndrome. This is a condition that can develop from taking fertility medicines. It causes multiple large ovarian cysts to form.  Polycystic ovarian syndrome (PCOS). This is a common hormonal disorder that can cause ovarian cysts, as well as problems with your period or fertility.  What increases the risk? The following factors may make  you more likely to develop ovarian cysts:  Being overweight or obese.  Taking fertility medicines.  Taking certain forms of hormonal birth control.  Smoking.  What are the signs or symptoms? Many ovarian cysts do not cause symptoms. If symptoms are present, they may include:  Pelvic pain or pressure.  Pain in the lower abdomen.  Pain during sex.  Abdominal swelling.  Abnormal menstrual periods.  Increasing pain with menstrual periods.  How is this diagnosed? These cysts are commonly found during a routine pelvic exam. You may have tests to find out more about the cyst, such as:  Ultrasound.  X-ray of the pelvis.  CT scan.  MRI.  Blood tests.  How is this treated? Many ovarian cysts go away on their own without treatment. Your health care provider may want to check your cyst regularly for 2-3 months to see if it changes. If you are in menopause, it is especially important to have your cyst monitored closely because menopausal women have a higher rate of ovarian cancer. When treatment is needed, it may include:  Medicines to help relieve pain.  A procedure to drain the cyst (aspiration).  Surgery to remove the whole cyst.  Hormone treatment or birth control pills. These methods are sometimes used to help dissolve a cyst.  Follow these instructions at home:  Take over-the-counter and prescription medicines only as told by your health care provider.  Do not drive or use heavy machinery while taking prescription pain medicine.  Get regular pelvic exams and Pap tests as often as told by your health care   provider.  Return to your normal activities as told by your health care provider. Ask your health care provider what activities are safe for you.  Do not use any products that contain nicotine or tobacco, such as cigarettes and e-cigarettes. If you need help quitting, ask your health care provider.  Keep all follow-up visits as told by your health care provider.  This is important. Contact a health care provider if:  Your periods are late, irregular, or painful, or they stop.  You have pelvic pain that does not go away.  You have pressure on your bladder or trouble emptying your bladder completely.  You have pain during sex.  You have any of the following in your abdomen: ? A feeling of fullness. ? Pressure. ? Discomfort. ? Pain that does not go away. ? Swelling.  You feel generally ill.  You become constipated.  You lose your appetite.  You develop severe acne.  You start to have more body hair and facial hair.  You are gaining weight or losing weight without changing your exercise and eating habits.  You think you may be pregnant. Get help right away if:  You have abdominal pain that is severe or gets worse.  You cannot eat or drink without vomiting.  You suddenly develop a fever.  Your menstrual period is much heavier than usual. This information is not intended to replace advice given to you by your health care provider. Make sure you discuss any questions you have with your health care provider. Document Released: 06/02/2005 Document Revised: 12/21/2015 Document Reviewed: 11/04/2015 Elsevier Interactive Patient Education  2018 [employer]  

## 2017-03-09 NOTE — Progress Notes (Signed)
Patient ID: Kaitlyn Whitney, female   DOB: 09/09/1965, 51 y.o.   MRN: [id number] Kaitlyn Whitney presents today for ER follow up. Pt was seen in the ER in August for abd pain.  Known uterine fibroids. U/S 02/11/17 demonstrated 3 uterine fibroids and left ovarian cyst. Pain has now resolved. LMP was in July. Mild menopausal Sx. Has had occ abd pain in the past but has noticed more frequent over the last few months. Pain is crampy and cyclic in nature.  Last pap smear was 11/13, nl; last mammogram was over 5 yrs ago.  Nulligravid  H/O HTN, Sleep apena  H/O Lap gastric band  PE AF VSS Lungs clear Heart RRR  A/P Uterine fibroids        Left ovarian cyst  Discussed U/S findings and treatment options with pt. Pt is considering hysterectomy. Will check F/U U/S d/t ovarian cyst. Pt to return after U/S for yearly and further management discussion.

## 2017-04-09 ENCOUNTER — Ambulatory Visit
Admission: RE | Admit: 2017-04-09 | Discharge: 2017-04-09 | Disposition: A | Discharge status: Home / Self Care | Payer: BLUE CROSS/BLUE SHIELD | Source: Ambulatory Visit | Attending: Obstetrics and Gynecology | Admitting: Obstetrics and Gynecology

## 2017-04-09 ENCOUNTER — Other Ambulatory Visit: Payer: Self-pay | Admitting: Obstetrics and Gynecology

## 2017-04-09 DIAGNOSIS — N83202 Unspecified ovarian cyst, left side: Secondary | ICD-10-CM

## 2017-04-28 ENCOUNTER — Other Ambulatory Visit: Payer: Self-pay | Admitting: Obstetrics and Gynecology

## 2017-04-28 ENCOUNTER — Encounter: Payer: Self-pay | Admitting: Obstetrics and Gynecology

## 2017-04-28 ENCOUNTER — Ambulatory Visit: Payer: BLUE CROSS/BLUE SHIELD | Admitting: Obstetrics and Gynecology

## 2017-04-28 ENCOUNTER — Ambulatory Visit (INDEPENDENT_AMBULATORY_CARE_PROVIDER_SITE_OTHER): Payer: BLUE CROSS/BLUE SHIELD | Admitting: Obstetrics and Gynecology

## 2017-04-28 VITALS — BP 180/124 | HR 81 | Ht 60.0 in | Wt 253.0 lb

## 2017-04-28 DIAGNOSIS — Z124 Encounter for screening for malignant neoplasm of cervix: Secondary | ICD-10-CM

## 2017-04-28 DIAGNOSIS — Z1231 Encounter for screening mammogram for malignant neoplasm of breast: Secondary | ICD-10-CM

## 2017-04-28 DIAGNOSIS — N83202 Unspecified ovarian cyst, left side: Secondary | ICD-10-CM

## 2017-04-28 DIAGNOSIS — Z1151 Encounter for screening for human papillomavirus (HPV): Secondary | ICD-10-CM | POA: Diagnosis not present

## 2017-04-28 DIAGNOSIS — Z01411 Encounter for gynecological examination (general) (routine) with abnormal findings: Secondary | ICD-10-CM

## 2017-04-28 DIAGNOSIS — R030 Elevated blood-pressure reading, without diagnosis of hypertension: Secondary | ICD-10-CM

## 2017-04-28 DIAGNOSIS — Z01419 Encounter for gynecological examination (general) (routine) without abnormal findings: Secondary | ICD-10-CM | POA: Diagnosis not present

## 2017-04-28 DIAGNOSIS — D219 Benign neoplasm of connective and other soft tissue, unspecified: Secondary | ICD-10-CM

## 2017-04-28 NOTE — Patient Instructions (Signed)
Abdominal Hysterectomy, Care After This sheet gives you information about how to care for yourself after your procedure. Your doctor may also give you more specific instructions. If you have problems or questions, contact your doctor. Follow these instructions at home: Bathing  Do not take baths, swim, or use a hot tub until your doctor says it is okay. Ask your doctor if you can take showers. You may only be allowed to take sponge baths for bathing.  Keep the bandage (dressing) dry until your doctor says it can be taken off. Surgical cut ( incision) care  Follow instructions from your doctor about how to take care of your cut from surgery. Make sure you: ? Wash your hands with soap and water before you change your bandage (dressing). If you cannot use soap and water, use hand sanitizer. ? Change your bandage as told by your doctor. ? Leave stitches (sutures), skin glue, or skin tape (adhesive) strips in place. They may need to stay in place for 2 weeks or longer. If tape strips get loose and curl up, you may trim the loose edges. Do not remove tape strips completely unless your doctor says it is okay.  Check your surgical cut area every day for signs of infection. Check for: ? Redness, swelling, or pain. ? Fluid or blood. ? Warmth. ? Pus or a bad smell. Activity  Do gentle, daily exercise as told by your doctor. You may be told to take short walks every day and go farther each time.  Do not lift anything that is heavier than 10 lb (4.5 kg), or the limit that your doctor tells you, until he or she says that it is safe.  Do not drive or use heavy machinery while taking prescription pain medicine.  Do not drive for 24 hours if you were given a medicine to help you relax (sedative).  Follow your doctor's advice about exercise, driving, and general activities. Ask your doctor what activities are safe for you. Lifestyle  Do not douche, use tampons, or have sex for at least 6 weeks or as  told by your doctor.  Do not drink alcohol until your doctor says it is okay.  Drink enough fluid to keep your pee (urine) clear or pale yellow.  Try to have someone at home with you for the first 1-2 weeks to help.  Do not use any products that contain nicotine or tobacco, such as cigarettes and e-cigarettes. These can slow down healing. If you need help quitting, ask your doctor. General instructions  Take over-the-counter and prescription medicines only as told by your doctor.  Do not take aspirin or ibuprofen. These medicines can cause bleeding.  To prevent or treat constipation while you are taking prescription pain medicine, your doctor may suggest that you: ? Drink enough fluid to keep your urine clear or pale yellow. ? Take over-the-counter or prescription medicines. ? Eat foods that are high in fiber, such as:  Fresh fruits and vegetables.  Whole grains.  Beans. ? Limit foods that are high in fat and processed sugars, such as fried and sweet foods.  Keep all follow-up visits as told by your doctor. This is important. Contact a doctor if:  You have chills or fever.  You have redness, swelling, or pain around your cut.  You have fluid or blood coming from your cut.  Your cut feels warm to the touch.  You have pus or a bad smell coming from your cut.  Your cut breaks   open.  You feel dizzy or light-headed.  You have pain or bleeding when you pee.  You keep having watery poop (diarrhea).  You keep feeling sick to your stomach (nauseous) or keep throwing up (vomiting).  You have unusual fluid (discharge) coming from your vagina.  You have a rash.  You have a reaction to your medicine.  Your pain medicine does not help. Get help right away if:  You have a fever and your symptoms get worse all of a sudden.  You have very bad belly (abdominal) pain.  You are short of breath.  You pass out (faint).  You have pain, swelling, or redness of your  leg.  You bleed a lot from your vagina and notice clumps of blood (clots). Summary  Do not take baths, swim, or use a hot tub until your doctor says it is okay. Ask your doctor if you can take showers. You may only be allowed to take sponge baths for bathing.  Follow your doctor's advice about exercise, driving, and general activities. Ask your doctor what activities are safe for you.  Do not lift anything that is heavier than 10 lb (4.5 kg), or the limit that your doctor tells you, until he or she says that it is safe.  Try to have someone at home with you for the first 1-2 weeks to help. This information is not intended to replace advice given to you by your health care provider. Make sure you discuss any questions you have with your health care provider. Document Released: 03/11/2008 Document Revised: 05/21/2016 Document Reviewed: 05/21/2016 Elsevier Interactive Patient Education  2017 [employer] Hysterectomy Information A hysterectomy is a surgery to remove your uterus. After surgery, you will no longer have periods. Also, you will not be able to get pregnant. Reasons for this surgery  You have bleeding that is not normal and keeps coming back.  You have lasting (chronic) lower belly (pelvic) pain.  You have a lasting infection.  The lining of your uterus grows outside your uterus.  The lining of your uterus grows in the muscle of your uterus.  Your uterus falls down into your vagina.  You have a growth in your uterus that causes problems.  You have cells that could turn into cancer (precancerous cells).  You have cancer of the uterus or cervix. Types There are 3 types of hysterectomies. Depending on the type, the surgery will:  Remove the top part of the uterus only.  Remove the uterus and the cervix.  Remove the uterus, cervix, and tissue that holds the uterus in place in the lower belly.  Ways a hysterectomy can be performed There are 5 ways this surgery can  be performed.  A cut (incision) is made in the belly (abdomen). The uterus is taken out through the cut.  A cut is made in the vagina. The uterus is taken out through the cut.  Three or four cuts are made in the belly. A surgical device with a camera is put through one of the cuts. The uterus is cut into small pieces. The uterus is taken out through the cuts or the vagina.  Three or four cuts are made in the belly. A surgical device with a camera is put through one of the cuts. The uterus is taken out through the vagina.  Three or four cuts are made in the belly. A surgical device that is controlled by a computer makes a visual image. The device helps the surgeon control   the surgical tools. The uterus is cut into small pieces. The pieces are taken out through the cuts or through the vagina.  What can I expect after the surgery?  You will be given pain medicine.  You will need help at home for 3-5 days after surgery.  You will need to see your doctor in 2-4 weeks after surgery.  You may get hot flashes, have night sweats, and have trouble sleeping.  You may need to have Pap tests in the future if your surgery was related to cancer. Talk to your doctor. It is still good to have regular exams. This information is not intended to replace advice given to you by your health care provider. Make sure you discuss any questions you have with your health care provider. Document Released: 08/25/2011 Document Revised: 11/08/2015 Document Reviewed: 02/07/2013 Elsevier Interactive Patient Education  2018 Elsevier Inc.  

## 2017-04-28 NOTE — Progress Notes (Signed)
Kaitlyn Whitney is a 51 y.o. G0P0 female here for a routine annual gynecologic exam and U/S follow up. She has had 2 more episodes of abd pain since her last OV. Follow up U/S continues to demonstrate uterine fibroids and a simple left ovarian cyst which is unchanged from prior U/S and CT scans. She reports an occ nite sweat. No cycle since July. Sexual active without problems.      Gynecologic History Patient's last menstrual period was 12/15/2016 (approximate). Contraception: none Last Pap: uncertain. Results were: normal Last mammogram: uncetrtain. Results were: normal  Obstetric History OB History  Gravida Para Term Preterm AB Living  0            SAB TAB Ectopic Multiple Live Births                   Past Medical History:  Diagnosis Date  . Hypertension   . Obesity   . Sleep apnea     Past Surgical History:  Procedure Laterality Date  . BREAST SURGERY    . LAPAROSCOPIC GASTRIC BANDING      Current Outpatient Medications on File Prior to Visit  Medication Sig Dispense Refill  . varenicline (CHANTIX PAK) 0.5 MG X 11 & 1 MG X 42 tablet Take 0.5 mg 2 (two) times daily by mouth. Take one 0.5 mg tablet by mouth once daily for 3 days, then increase to one 0.5 mg tablet twice daily for 4 days, then increase to one 1 mg tablet twice daily.    . traMADol (ULTRAM) 50 MG tablet Take 1 tablet (50 mg total) by mouth every 6 (six) hours as needed for severe pain. (Patient not taking: Reported on 03/09/2017) 60 tablet 0   No current facility-administered medications on file prior to visit.     Allergies  Allergen Reactions  . Penicillins Other (See Comments)    Yeast infection Has patient had a PCN reaction causing immediate rash, facial/tongue/throat swelling, SOB or lightheadedness with hypotension: No Has patient had a PCN reaction causing severe rash involving mucus membranes or skin necrosis: No Has patient had a PCN reaction that required hospitalization: Unknown Has patient  had a PCN reaction occurring within the last 10 years: No If all of the above answers are "NO", then may proceed with Cephalosporin use.    Social History   Socioeconomic History  . Marital status: Married    Spouse name: Not on file  . Number of children: Not on file  . Years of education: Not on file  . Highest education level: Not on file  Social Needs  . Financial resource strain: Not on file  . Food insecurity - worry: Not on file  . Food insecurity - inability: Not on file  . Transportation needs - medical: Not on file  . Transportation needs - non-medical: Not on file  Occupational History  . Not on file  Tobacco Use  . Smoking status: Current Every Day Smoker    Packs/day: 0.10    Years: 30.00    Pack years: 3.00    Types: Cigarettes  . Smokeless tobacco: Never Used  . Tobacco comment: 10 cig per day  Substance and Sexual Activity  . Alcohol use: No  . Drug use: No  . Sexual activity: Yes    Birth control/protection: None  Other Topics Concern  . Not on file  Social History Narrative  . Not on file    Family History  Problem Relation Age of Onset  .  Asthma Mother   . Cancer Father   . Drug abuse Father   . Drug abuse Brother   . Diabetes Brother   . Drug abuse Brother   . Heart attack Brother   . Asthma Other   . Hypertension Other   . Stroke Other     The following portions of the patient's history were reviewed and updated as appropriate: allergies, current medications, past family history, past medical history, past social history, past surgical history and problem list.  Review of Systems Pertinent items noted in HPI and remainder of comprehensive ROS otherwise negative.   Objective:  BP (!) 180/124   Pulse 81   Ht 5' (1.524 m)   Wt 253 lb (114.8 kg)   LMP 12/15/2016 (Approximate)   BMI 49.41 kg/m  CONSTITUTIONAL: Well-developed, well-nourished female in no acute distress.  HENT:  Normocephalic, atraumatic, External right and left ear  normal. Oropharynx is clear and moist EYES: Conjunctivae and EOM are normal. Pupils are equal, round, and reactive to light. No scleral icterus.  NECK: Normal range of motion, supple, no masses.  Normal thyroid.  SKIN: Skin is warm and dry. No rash noted. Not diaphoretic. No erythema. No pallor. [place]: Alert and oriented to person, place, and time. Normal reflexes, muscle tone coordination. No cranial nerve deficit noted. PSYCHIATRIC: Normal mood and affect. Normal behavior. Normal judgment and thought content. CARDIOVASCULAR: Normal heart rate noted, regular rhythm RESPIRATORY: Clear to auscultation bilaterally. Effort and breath sounds normal, no problems with respiration noted. BREASTS: Symmetric in size. No masses, skin changes, nipple drainage, or lymphadenopathy. ABDOMEN: Soft, normal bowel sounds, no distention noted.  No tenderness, rebound or guarding.  PELVIC: Normal appearing external genitalia; normal appearing vaginal mucosa and cervix.  No abnormal discharge noted.  Pap smear obtained.  Enlarged uterus @ 16 weeks, irregular contour, left adnexal fullness, limited by pt habitus  MUSCULOSKELETAL: Normal range of motion. No tenderness.  No cyanosis, clubbing, or edema.  2+ distal pulses.   Assessment:  Annual gynecologic examination with pap smear Enlarged fibroid uterus Persistent left ovarian cysts Elevated BP   Plan:  Will follow up results of pap smear and manage accordingly. Mammogram scheduled Routine preventative health maintenance measures emphasized. Pt encouraged to PCP for eval of there BP. Last seen 2 yrs ago.  Management options for fibroids and ovarian cyst reviewed with pt. She desires definite therapy. TAH with BS and LO recommended to pt. R/B/Post op care reviewed with pt. Pt informed that I can not guarantee that her pain would be eliminated with surgery.   Will send letter to PCP for pre op clearance before scheduling surgery.  Will follow up with post  op appt.   Please refer to After Visit Summary for other counseling recommendations.    [name], MD, [place] Attending [place] for [facility], [place]

## 2017-05-05 LAB — CYTOLOGY - PAP
Adequacy: ABSENT
Diagnosis: NEGATIVE
HPV: NOT DETECTED

## 2017-05-27 ENCOUNTER — Ambulatory Visit: Payer: BLUE CROSS/BLUE SHIELD

## 2017-05-27 ENCOUNTER — Encounter (HOSPITAL_COMMUNITY): Payer: Self-pay

## 2017-06-10 ENCOUNTER — Ambulatory Visit: Payer: BLUE CROSS/BLUE SHIELD

## 2017-06-10 DIAGNOSIS — D259 Leiomyoma of uterus, unspecified: Secondary | ICD-10-CM

## 2017-06-10 NOTE — Progress Notes (Signed)
Per Dr. Rip Harbour; we are awaiting Medical Clearance from her PCP.  Patient tried calling her PCP while she was in the office, she got no answer to her call.  Patient stated that she will call her PCP and have her fax [place] the information.

## 2017-06-11 ENCOUNTER — Telehealth: Payer: Self-pay | Admitting: Pediatrics

## 2017-06-11 NOTE — Telephone Encounter (Signed)
Pt left message on nurse line for return call.  When I called patient back she states she called the wrong office.

## 2017-06-22 ENCOUNTER — Institutional Professional Consult (permissible substitution): Payer: Self-pay | Admitting: Neurology

## 2017-06-22 ENCOUNTER — Telehealth: Payer: Self-pay

## 2017-06-22 NOTE — Telephone Encounter (Signed)
Pt did not show for their appt with Dr. Athar today.  

## 2017-06-23 ENCOUNTER — Encounter: Payer: Self-pay | Admitting: Neurology

## 2017-06-23 ENCOUNTER — Telehealth: Payer: Self-pay | Admitting: Neurology

## 2017-06-23 ENCOUNTER — Ambulatory Visit: Payer: BLUE CROSS/BLUE SHIELD | Admitting: Obstetrics and Gynecology

## 2017-06-23 NOTE — Telephone Encounter (Signed)
-----   Message from [place], [employer] sent at 06/22/2017 9:58 PM EST -----    Appointment Request From: Kaitlyn Whitney    With Provider: Star Age, MD [[facility]]    Preferred Date Range: 06/30/2017 - 07/03/2017    Preferred Times: Any time    Reason for visit: Request an Appointment    Comments:  I have a stomach virus and a cold missed my apt

## 2017-06-25 ENCOUNTER — Ambulatory Visit: Payer: BLUE CROSS/BLUE SHIELD

## 2017-07-08 ENCOUNTER — Encounter: Payer: Self-pay | Admitting: *Deleted

## 2017-07-09 ENCOUNTER — Encounter (HOSPITAL_COMMUNITY): Payer: Self-pay

## 2017-07-21 ENCOUNTER — Encounter: Payer: Self-pay | Admitting: Neurology

## 2017-07-22 ENCOUNTER — Ambulatory Visit: Payer: BLUE CROSS/BLUE SHIELD | Admitting: Neurology

## 2017-07-22 ENCOUNTER — Encounter: Payer: Self-pay | Admitting: Neurology

## 2017-07-22 VITALS — BP 137/93 | HR 82 | Ht 60.0 in | Wt 252.0 lb

## 2017-07-22 DIAGNOSIS — G4733 Obstructive sleep apnea (adult) (pediatric): Secondary | ICD-10-CM | POA: Insufficient documentation

## 2017-07-22 DIAGNOSIS — G4709 Other insomnia: Secondary | ICD-10-CM | POA: Insufficient documentation

## 2017-07-22 DIAGNOSIS — Z6841 Body Mass Index (BMI) 40.0 and over, adult: Secondary | ICD-10-CM

## 2017-07-22 DIAGNOSIS — Z9989 Dependence on other enabling machines and devices: Secondary | ICD-10-CM

## 2017-07-22 DIAGNOSIS — Z9884 Bariatric surgery status: Secondary | ICD-10-CM | POA: Diagnosis not present

## 2017-07-22 NOTE — Progress Notes (Signed)
----- Message from [place], Generic sent at 06/22/2017 9:58 PM EST -----    Appointment Request From: Kaitlyn Whitney    With Provider: Star Age, MD [[facility]]    Preferred Date Range: 06/30/2017 - 07/03/2017    Preferred Times: Any time    Reason for visit: Request an Appointment    Comments:  I have a stomach virus and a cold missed my apt    [place]   Provider:  Larey Whitney, Kaitlyn Whitney  Primary Care Physician:  [place]   Referring Provider:  Arn Medal, MD.     Chief Complaint  Patient presents with  . New Patient (Initial Visit)    pt alone, rm 10 pt states that she was dg 10+ years ago.pt has had current machine for 9 years. pt is here because she thinks she needs to either have pressure adjusted on this machine or new sleep study or new machine. pt husband states she still snores. pt brought in her current CPAP machine, upon trying to read the card I noticed the card was not inserted into the machine all the way. I read the card and last card download was 2017. pt states she has been using it nightly.    HPI:  Kaitlyn Whitney is a 52 y.o. female , seen here as in a referral for transfer of care  from Dr. Sonic Whitney of [employer].   Her PCP is Dr . Arn Medal, MD. Kaitlyn Whitney is a 52 year old right-handed African-American female patient probably a decade now.  She was diagnosed at the [facility] in the year 2008 or 2009 and begun CPAP therapy soon after.  She snores according to her husband's observation while using her CPAP.  This was not the case years ago and she has used the same machine for the last 8 or 9 years. She preferred a FFM.  I was able to get a compliance summary from 2017 the patient had used the machine 100% compliant with an average use at time of 6 hours and 50 minutes at night, the device was set at 10 cmH2O pressure was recently a ramp time of 20 minutes starting at 4 cm  H20.     Chief complaint according to patient : I need new supplies -begun snoring while using CPAP  Sleep habits are as follows: Bedtime is 2:30 AM she rises usually around 8 AM.  For the last hour before she retreats to her bedroom she is TV in her living room, patient retreats to a cool, quiet and dark room which she shares with her husband.  She is using a sleep number bed, flat mattress sleeping on 4 pillows.  She prefers to sleep on her side.  She usually needs about 20-30 minutes to fall asleep once in bed, and then can sleep through for about 5 hours without any bathroom break or other interruption. She dreams a lot, but she does not report frequent nightmares.  Dreams are usually pleasant and vivid.  Her husband states that she snores but he has not witnessed sleep talking, sleepwalking or REM behavior. She usually feels refreshed and restored in the morning, out headaches, without a dry mouth, no palpitations dizziness nausea or diaphoresis are reported.  Overnight sleep time is only 5.5 hours .  She has tried to go to bed earlier but then wakes up still after 5 4 5-1/2 hours has trouble going back to sleep.  On  certain nights she will try Ambien to help her sleep, this is an exception not a routine medication for her, and she has noted that she feels groggy in the morning. She will nap during daytime for about an hour after lunch.   Sleep medical history and family sleep history: The patient's mother carries a diagnosis of obstructive sleep apnea and uses a newer dream station machine. Siblings are not affected by apnea.  Patient does not report any sleep disorders affecting her during childhood such as sleepwalking, night terrors or enuresis. No tonsillectomy in childhood, the patient had a lap band surgery for weight loss, in 2008.   Social history:  Married , no biological children.  The couple adopted a daughter ( 20 )  and also raised Mr. Whitney's son (72). Kaitlyn Whitney is  currently not working outside the home but she raises her niece with special needs, Whitney 50, former premie.  Mother is not able to care for her.  Smoking - 5/ day currently on chantix. ETOH ; none, Caffeine intake: One mug of coffee in the morning, sometimes she will drinks tea or wine but this is not a daily occurrence.  She does not drink iced tea, nor energy drinks.   Review of Systems: Out of a complete 14 system review, the patient complains of only the following symptoms, and all other reviewed systems are negative. Failed lap band patient. Interested in a sleeve procedure.  Snoring.  Insomnia.     Epworth score 6, Fatigue severity score 30  , depression score n/a    Social History   Socioeconomic History  . Marital status: Married    Spouse name: Not on file  . Number of children: Not on file  . Years of education: Not on file  . Highest education level: Not on file  Social Needs  . Financial resource strain: Not on file  . Food insecurity - worry: Not on file  . Food insecurity - inability: Not on file  . Transportation needs - medical: Not on file  . Transportation needs - non-medical: Not on file  Occupational History  . Not on file  Tobacco Use  . Smoking status: Current Every Day Smoker    Packs/day: 0.10    Years: 30.00    Pack years: 3.00    Types: Cigarettes  . Smokeless tobacco: Never Used  . Tobacco comment: 10 cig per day  Substance and Sexual Activity  . Alcohol use: No  . Drug use: No  . Sexual activity: Yes    Birth control/protection: None  Other Topics Concern  . Not on file  Social History Narrative  . Not on file    Family History  Problem Relation Whitney of Onset  . Asthma Mother   . Cancer Father   . Drug abuse Father   . Drug abuse Brother   . Diabetes Brother   . Drug abuse Brother   . Heart attack Brother   . Asthma Other   . Hypertension Other   . Stroke Other     Past Medical History:  Diagnosis Date  . Hypertension   .  Obesity   . Sleep apnea     Past Surgical History:  Procedure Laterality Date  . BREAST SURGERY    . LAPAROSCOPIC GASTRIC BANDING      Current Outpatient Medications  Medication Sig Dispense Refill  . amLODipine (NORVASC) 10 MG tablet Take 5 mg by mouth daily.    Marland Kitchen levocetirizine (XYZAL) 5  MG tablet     . varenicline (CHANTIX PAK) 0.5 MG X 11 & 1 MG X 42 tablet Take 0.5 mg 2 (two) times daily by mouth. Take one 0.5 mg tablet by mouth once daily for 3 days, then increase to one 0.5 mg tablet twice daily for 4 days, then increase to one 1 mg tablet twice daily.    Marland Kitchen zolpidem (AMBIEN) 10 MG tablet TAKE 1 TABLET (10 MG TOTAL) BY MOUTH NIGHTLY AS NEEDED FOR UP TO 30 DAYS FOR SLEEP.  3   No current facility-administered medications for this visit.     Allergies as of 07/22/2017 - Review Complete 07/22/2017  Allergen Reaction Noted  . Penicillins Other (See Comments) 01/26/2012    Vitals: BP (!) 137/93   Pulse 82   Ht 5' (1.524 m)   Wt 252 lb (114.3 kg)   BMI 49.22 kg/m  Last Weight:  Wt Readings from Last 1 Encounters:  07/22/17 252 lb (114.3 kg)   AGT:XMIW mass index is 49.22 kg/m.     Last Height:   Ht Readings from Last 1 Encounters:  07/22/17 5' (1.524 m)    Physical exam:  General: The patient is awake, alert and appears not in acute distress. The patient is well groomed. Head: Normocephalic, atraumatic. Neck is supple. Mallampati 4, lateral narrow airway.   neck circumference:18". Nasal airflow patent ,  Retrognathia is not seen. Biological teeth.  Cardiovascular:  Regular rate and rhythm , without  murmurs or carotid bruit, and without distended neck veins. Respiratory: Lungs are clear to auscultation. Skin:  Without evidence of edema, or rash Trunk: BMI is 49.22. The patient's posture is erect   Neurologic exam : The patient is awake and alert, oriented to place and time.   Attention span & concentration ability appears normal.  Speech is fluent,  without  dysarthria, dysphonia or aphasia.  Mood and affect are appropriate.  Cranial nerves: Pupils are equal and briskly reactive to light. Funduscopic exam without  evidence of pallor or edema. Extraocular movements  in vertical and horizontal planes intact and without nystagmus. Visual fields by finger perimetry are intact. Hearing to finger rub intact.  Facial sensation intact to fine touch. Facial motor strength is symmetric and tongue and uvula move midline. Shoulder shrug was symmetrical.   Motor exam:  Normal tone, muscle bulk and symmetric strength in all extremities. Sensory:  Fine touch, pinprick and vibration were tested in all extremities. Proprioception tested in the upper extremities was normal. Coordination: Rapid alternating movements in the fingers/hands was normal. Finger-to-nose maneuver  normal without evidence of ataxia, dysmetria or tremor. Gait and station: Patient walks without assistive device and is able unassisted to climb up to the exam table. Strength within normal limits. Stance is stable and normal.   Turns with 4  Steps.  Deep tendon reflexes: in the  upper and lower extremities are symmetric and intact. Babinski maneuver response is  downgoing.  Assessment:  After physical and neurologic examination, review of laboratory studies,  Personal review of imaging studies, reports of other /same  Imaging studies, results of polysomnography and / or neurophysiology testing and pre-existing records as far as provided in visit., my assessment is   1) Mrs. Debruler carries a diagnosis of obstructive sleep apnea since her 30s, last current CPAP machine was issued 7 or 8 years ago may be even 9.  The settings were quoted above 10 cmH2O pressure ramp 4 cmH2O pressure.  She has always been compliant with  her CPAP use under a full facemask and heated humidity.  She began snoring through her mask, and has also been more fatigued and sleepy in daytime indicating that the machine is not  working well for her right now this may be due to the Whitney of his mask and headgear and supplies.   2) obesity, BMI 49.6 -  3) smoking cessation- 1 pp d smoker for 32 years . No COPD yet. Using chantix. Lives with a smoker and has second hand smoke exposure as well.   The patient was advised of the nature of the diagnosed disorder , the treatment options and the  risks for general health and wellness arising from not treating the condition.  In order to transfer her sleep care I will have to repeat a sleep study, I would like to retitrate CPAP.I will order a split-night polysomnography ,  in addition I would like to offer her a referral to a weight management medical clinic within the [facility] with Dr. Dennard Nip, MD. Patient is status post weight loss surgery at the time she shows a lap band but this did not work out well for her, her heaviest weight was 284 pounds before  lap band surgery, and she is currently weighing in at 250 pounds. Just short of BMI 50.   We also discussed sleep hygiene, smart phone and screen time restriction.    I spent more than 40 minutes of face to face time with the patient. Greater than 50% of time was spent in counseling and coordination of care. We have discussed the diagnosis and differential and I answered the patient's questions.     Rv:  I will follow up after sleep test,  Hopefully SPLIT night at AHI 20  With 3 % desat score. Watch for hypoxemia.     ref: [employer] [place].   Kaitlyn Seat, MD 0/02/3817, 2:99 AM  Certified in Neurology by ABPN Certified in [place] by [facility] [address], [place], [place] [address]

## 2017-08-05 NOTE — Patient Instructions (Addendum)
Your procedure is scheduled on: Tuesday August 18, 2017 at 1:30 pm  Enter through the Main Entrance of [facility] at:12:00 pm  Pick up the phone at the desk and dial [phone].  Call this number if you have problems the morning of surgery: [phone].  Remember: Do NOT eat food: after Midnight on Monday March 4 Do NOT drink clear liquids after: 7:30 am day of surgery Take these medicines the morning of surgery with a SIP OF WATER: amlodipine night before surgery, Chantix,  STOP ALL VITAMINS AND SUPPLEMENTS 1 WEEK PRIOR TO SURGERY  DO NOT SMOKE DAY OF SURGERY  Do NOT wear jewelry (body piercing), metal hair clips/bobby pins, make-up, or nail polish. Do NOT wear lotions, powders, or perfumes.  You may wear deoderant. Do NOT shave for 48 hours prior to surgery. Do NOT bring valuables to the hospital. Contacts, dentures, or bridgework may not be worn into surgery. Leave suitcase in car.  After surgery it may be brought to your room.    For patients admitted to the hospital, checkout time is 11:00 AM the day of discharge.

## 2017-08-06 ENCOUNTER — Ambulatory Visit (HOSPITAL_COMMUNITY)
Admission: EM | Admit: 2017-08-06 | Discharge: 2017-08-06 | Disposition: A | Discharge status: Home / Self Care | Payer: BLUE CROSS/BLUE SHIELD | Attending: Family Medicine | Admitting: Family Medicine

## 2017-08-06 ENCOUNTER — Encounter (HOSPITAL_COMMUNITY): Payer: Self-pay | Admitting: Emergency Medicine

## 2017-08-06 DIAGNOSIS — J019 Acute sinusitis, unspecified: Secondary | ICD-10-CM | POA: Diagnosis not present

## 2017-08-06 MED ORDER — DOXYCYCLINE HYCLATE 100 MG PO CAPS: 100.0000 mg | ORAL_CAPSULE | Freq: Two times a day (BID) | ORAL | 0 refills | Status: AC

## 2017-08-06 NOTE — ED Provider Notes (Signed)
[place]    CSN: [id number] Arrival date & time: 08/06/17  1259     History   Chief Complaint Chief Complaint  Patient presents with  . Facial Pain    HPI Kaitlyn Whitney is a 52 y.o. female.   Kaitlyn Whitney presents with complaints of right facial pain and pressure with nasal congestion. States she had flu like symptoms last week which have improved, but facial pressure has worsened. Poor sleep due to this. Symptoms have been ongoing at least 10 days. No longer with fevers. Pain is worse at night. Does seem to improve with eating rather than eating. Radiates into right jaw. Right ear with pressure. Occasional productive cough. States she feels something to the back of her throat but it is not sore. Her daughter is now ill. Denies any previous similar illness. Has been taking advil, benadryl and sudafed which have minimally helped symptoms.     ROS per HPI.       Past Medical History:  Diagnosis Date  . Hypertension   . Obesity   . Sleep apnea     Patient Active Problem List   Diagnosis Date Noted  . OSA on CPAP 07/22/2017  . Class 3 severe obesity with body mass index (BMI) of 45.0 to 49.9 in adult ([place]) 07/22/2017  . Status post bariatric surgery 07/22/2017  . Other insomnia 07/22/2017  . Visit for routine gyn exam 04/28/2017  . Ovarian cyst 03/09/2017  . Fibroid 03/09/2017  . History of laparoscopic adjustable gastric banding, APL.  10/23/2008. 09/02/2012  . S/P endometrial ablation 04/22/2012  . DIPLOPIA 05/22/2009  . MERALGIA PARESTHETICA 03/07/2008  . TOBACCO USE DISORDER/SMOKER-SMOKING CESSATION DISCUSSED 09/29/2007  . PRURITUS ANI 09/29/2007  . FECES, ABNORMAL 09/29/2007  . OBESITY, MORBID 07/01/2007  . DEPRESSION 07/01/2007  . OBSTRUCTIVE SLEEP APNEA 07/01/2007  . ELEVATED BP 07/01/2007    Past Surgical History:  Procedure Laterality Date  . BREAST SURGERY    . LAPAROSCOPIC GASTRIC BANDING      OB History    Gravida Para Term Preterm AB  Living   0             SAB TAB Ectopic Multiple Live Births                   Home Medications    Prior to Admission medications   Medication Sig Start Date End Date Taking? Authorizing Provider  acetaminophen (TYLENOL) 500 MG tablet Take 500 mg by mouth 3 (three) times daily as needed for moderate pain or headache.   Yes [provider]  amLODipine (NORVASC) 10 MG tablet Take 10 mg by mouth daily.    Yes [provider]  diphenhydrAMINE (BENADRYL) 25 mg capsule Take 25 mg by mouth daily as needed for allergies.   Yes [provider]  POTASSIUM PO Take 1 tablet by mouth daily as needed (leg cramps).   Yes [provider]  varenicline (CHANTIX) 1 MG tablet Take 1 mg by mouth 2 (two) times daily.   Yes [provider]  zolpidem (AMBIEN) 10 MG tablet TAKE 1 TABLET (10 MG TOTAL) BY MOUTH NIGHTLY AS NEEDED FOR UP TO 30 DAYS FOR SLEEP. 06/18/17  Yes [provider]  doxycycline (VIBRAMYCIN) 100 MG capsule Take 1 capsule (100 mg total) by mouth 2 (two) times daily for 7 days. 08/06/17 08/13/17  Zigmund Gottron, NP    Family History Family History  Problem Relation Age of Onset  . Asthma Mother   .  Cancer Father   . Drug abuse Father   . Drug abuse Brother   . Diabetes Brother   . Drug abuse Brother   . Heart attack Brother   . Asthma Other   . Hypertension Other   . Stroke Other     Social History Social History   Tobacco Use  . Smoking status: Current Every Day Smoker    Packs/day: 0.10    Years: 30.00    Pack years: 3.00    Types: Cigarettes  . Smokeless tobacco: Never Used  . Tobacco comment: 10 cig per day  Substance Use Topics  . Alcohol use: No  . Drug use: No     Allergies   Penicillins   Review of Systems Review of Systems   Physical Exam Triage Vital Signs ED Triage Vitals  Enc Vitals Group     BP 08/06/17 1319 132/71     Pulse Rate 08/06/17 1319 71     Resp --      Temp 08/06/17 1319 98.7 F  (37.1 C)     Temp Source 08/06/17 1319 Oral     SpO2 08/06/17 1319 100 %     Weight 08/06/17 1319 246 lb (111.6 kg)     Height 08/06/17 1319 5' (1.524 m)     Head Circumference --      Peak Flow --      Pain Score 08/06/17 1322 4     Pain Loc --      Pain Edu? --      Excl. in [place]? --    No data found.  Updated Vital Signs BP 132/71 (BP Location: Right Arm)   Pulse 71   Temp 98.7 F (37.1 C) (Oral)   Ht 5' (1.524 m)   Wt 246 lb (111.6 kg)   SpO2 100%   BMI 48.04 kg/m   Visual Acuity Right Eye Distance:   Left Eye Distance:   Bilateral Distance:    Right Eye Near:   Left Eye Near:    Bilateral Near:     Physical Exam  Constitutional: She is oriented to person, place, and time. She appears well-developed and well-nourished. No distress.  HENT:  Head: Normocephalic and atraumatic.    Right Ear: Tympanic membrane, external ear and ear canal normal.  Left Ear: Tympanic membrane, external ear and ear canal normal.  Nose: Right sinus exhibits maxillary sinus tenderness.  Mouth/Throat: Uvula is midline, oropharynx is clear and moist and mucous membranes are normal. No tonsillar exudate.  Tenderness to right face and right maxillary sinus without swelling noted; without dental abnormalities visualized. Mild bilateral lymphadenopathy   Eyes: Conjunctivae and EOM are normal. Pupils are equal, round, and reactive to light.  Cardiovascular: Normal rate, regular rhythm and normal heart sounds.  Pulmonary/Chest: Effort normal and breath sounds normal.  Neurological: She is alert and oriented to person, place, and time.  Skin: Skin is warm and dry.     UC Treatments / Results  Labs (all labs ordered are listed, but only abnormal results are displayed) Labs Reviewed - No data to display  EKG  EKG Interpretation None       Radiology No results found.  Procedures Procedures (including critical care time)  Medications Ordered in UC Medications - No data to  display   Initial Impression / Assessment and Plan / UC Course  I have reviewed the triage vital signs and the nursing notes.  Pertinent labs & imaging results that were available during my  care of the patient were reviewed by me and considered in my medical decision making (see chart for details).     Complete course of antibiotics.  Push fluids to ensure adequate hydration and keep secretions thin. Tylenol and/or ibuprofen as needed for pain or fevers.  If symptoms worsen or do not improve in the next week to return to be seen or to follow up with PCP.  Patient verbalized understanding and agreeable to plan.    Final Clinical Impressions(s) / UC Diagnoses   Final diagnoses:  Acute non-recurrent sinusitis, unspecified location    ED Discharge Orders        Ordered    doxycycline (VIBRAMYCIN) 100 MG capsule  2 times daily     08/06/17 1408       Controlled Substance Prescriptions [place] Controlled Substance Registry consulted? Not Applicable   Zigmund Gottron, NP 08/06/17 1413

## 2017-08-06 NOTE — Discharge Instructions (Signed)
Push fluids to ensure adequate hydration and keep secretions thin.  Tylenol and/or ibuprofen as needed for pain or fevers.  Complete course of antibiotics.  If symptoms worsen or do not improve in the next week to return to be seen or to follow up with your PCP.

## 2017-08-06 NOTE — ED Triage Notes (Signed)
PT reports sinus pain and pressure over right side of face. PT reports jaw and ear pain on affected side.

## 2017-08-07 ENCOUNTER — Encounter (HOSPITAL_COMMUNITY)
Admission: RE | Admit: 2017-08-07 | Discharge: 2017-08-07 | Disposition: A | Discharge status: Home / Self Care | Payer: BLUE CROSS/BLUE SHIELD | Source: Ambulatory Visit | Attending: Obstetrics and Gynecology | Admitting: Obstetrics and Gynecology

## 2017-08-07 ENCOUNTER — Other Ambulatory Visit: Payer: Self-pay

## 2017-08-07 ENCOUNTER — Encounter (HOSPITAL_COMMUNITY): Payer: Self-pay

## 2017-08-07 DIAGNOSIS — Z0183 Encounter for blood typing: Secondary | ICD-10-CM | POA: Insufficient documentation

## 2017-08-07 DIAGNOSIS — Z01812 Encounter for preprocedural laboratory examination: Secondary | ICD-10-CM | POA: Diagnosis present

## 2017-08-07 HISTORY — DX: Unspecified osteoarthritis, unspecified site: M19.90

## 2017-08-07 HISTORY — DX: Personal history of urinary calculi: Z87.442

## 2017-08-07 HISTORY — DX: Restless legs syndrome: G25.81

## 2017-08-07 HISTORY — DX: Anxiety disorder, unspecified: F41.9

## 2017-08-07 LAB — CBC
HCT: 41.2 % (ref 36.0–46.0)
HEMOGLOBIN: 14 g/dL (ref 12.0–15.0)
MCH: 30.6 pg (ref 26.0–34.0)
MCHC: 34 g/dL (ref 30.0–36.0)
MCV: 90 fL (ref 78.0–100.0)
PLATELETS: 308 10*3/uL (ref 150–400)
RBC: 4.58 MIL/uL (ref 3.87–5.11)
RDW: 14.1 % (ref 11.5–15.5)
WBC: 6.5 10*3/uL (ref 4.0–10.5)

## 2017-08-07 LAB — BASIC METABOLIC PANEL
Anion gap: 6 (ref 5–15)
BUN: 10 mg/dL (ref 6–20)
CO2: 22 mmol/L (ref 22–32)
CREATININE: 0.58 mg/dL (ref 0.44–1.00)
Calcium: 8.4 mg/dL — ABNORMAL LOW (ref 8.9–10.3)
Chloride: 108 mmol/L (ref 101–111)
GFR calc Af Amer: >60 mL/min (ref >60)
GFR calc non Af Amer: >60 mL/min (ref >60)
GLUCOSE: 96 mg/dL (ref 65–99)
Potassium: 3.6 mmol/L (ref 3.5–5.1)
SODIUM: 136 mmol/L (ref 135–145)

## 2017-08-07 LAB — ABO/RH: ABO/RH(D): AB POS

## 2017-08-07 LAB — TYPE AND SCREEN
ABO/RH(D): AB POS
Antibody Screen: NEGATIVE

## 2017-08-17 ENCOUNTER — Other Ambulatory Visit (HOSPITAL_COMMUNITY): Payer: Self-pay | Admitting: Chiropractic Medicine

## 2017-08-17 ENCOUNTER — Ambulatory Visit (HOSPITAL_COMMUNITY)
Admission: RE | Admit: 2017-08-17 | Discharge: 2017-08-17 | Disposition: A | Discharge status: Home / Self Care | Payer: BLUE CROSS/BLUE SHIELD | Source: Ambulatory Visit | Attending: Chiropractic Medicine | Admitting: Chiropractic Medicine

## 2017-08-17 DIAGNOSIS — M5136 Other intervertebral disc degeneration, lumbar region: Secondary | ICD-10-CM | POA: Diagnosis not present

## 2017-08-17 DIAGNOSIS — R102 Pelvic and perineal pain: Secondary | ICD-10-CM

## 2017-08-17 DIAGNOSIS — M5134 Other intervertebral disc degeneration, thoracic region: Secondary | ICD-10-CM | POA: Insufficient documentation

## 2017-08-17 DIAGNOSIS — M5030 Other cervical disc degeneration, unspecified cervical region: Secondary | ICD-10-CM | POA: Diagnosis not present

## 2017-08-17 DIAGNOSIS — M419 Scoliosis, unspecified: Secondary | ICD-10-CM | POA: Diagnosis not present

## 2017-08-17 DIAGNOSIS — M779 Enthesopathy, unspecified: Secondary | ICD-10-CM | POA: Diagnosis not present

## 2017-08-17 DIAGNOSIS — M549 Dorsalgia, unspecified: Secondary | ICD-10-CM | POA: Diagnosis not present

## 2017-08-17 DIAGNOSIS — M542 Cervicalgia: Secondary | ICD-10-CM | POA: Diagnosis present

## 2017-08-17 DIAGNOSIS — G8929 Other chronic pain: Secondary | ICD-10-CM

## 2017-08-18 ENCOUNTER — Inpatient Hospital Stay (HOSPITAL_COMMUNITY): Payer: BLUE CROSS/BLUE SHIELD | Admitting: Anesthesiology

## 2017-08-18 ENCOUNTER — Encounter (HOSPITAL_COMMUNITY)
Admission: RE | Disposition: A | Discharge status: Home / Self Care | Payer: Self-pay | Source: Ambulatory Visit | Attending: Obstetrics and Gynecology

## 2017-08-18 ENCOUNTER — Inpatient Hospital Stay (HOSPITAL_COMMUNITY)
Admission: RE | Admit: 2017-08-18 | Discharge: 2017-08-20 | DRG: 742 | Disposition: A | Discharge status: Home / Self Care | Payer: BLUE CROSS/BLUE SHIELD | Source: Ambulatory Visit | Attending: Obstetrics and Gynecology | Admitting: Obstetrics and Gynecology

## 2017-08-18 ENCOUNTER — Other Ambulatory Visit: Payer: Self-pay

## 2017-08-18 ENCOUNTER — Encounter (HOSPITAL_COMMUNITY): Payer: Self-pay

## 2017-08-18 DIAGNOSIS — Z880 Allergy status to penicillin: Secondary | ICD-10-CM | POA: Diagnosis not present

## 2017-08-18 DIAGNOSIS — F17210 Nicotine dependence, cigarettes, uncomplicated: Secondary | ICD-10-CM | POA: Diagnosis present

## 2017-08-18 DIAGNOSIS — Z6841 Body Mass Index (BMI) 40.0 and over, adult: Secondary | ICD-10-CM | POA: Diagnosis not present

## 2017-08-18 DIAGNOSIS — N83202 Unspecified ovarian cyst, left side: Secondary | ICD-10-CM | POA: Diagnosis present

## 2017-08-18 DIAGNOSIS — D259 Leiomyoma of uterus, unspecified: Principal | ICD-10-CM | POA: Diagnosis present

## 2017-08-18 DIAGNOSIS — I10 Essential (primary) hypertension: Secondary | ICD-10-CM | POA: Diagnosis present

## 2017-08-18 DIAGNOSIS — Z9889 Other specified postprocedural states: Secondary | ICD-10-CM

## 2017-08-18 DIAGNOSIS — R102 Pelvic and perineal pain: Secondary | ICD-10-CM

## 2017-08-18 HISTORY — PX: LYSIS OF ADHESION: SHX5961

## 2017-08-18 HISTORY — PX: SALPINGOOPHORECTOMY: SHX82

## 2017-08-18 HISTORY — PX: HYSTERECTOMY ABDOMINAL WITH SALPINGECTOMY: SHX6725

## 2017-08-18 LAB — PREGNANCY, URINE: Preg Test, Ur: NEGATIVE

## 2017-08-18 SURGERY — HYSTERECTOMY, TOTAL, ABDOMINAL, WITH SALPINGECTOMY
Anesthesia: General | Site: Abdomen

## 2017-08-18 MED ORDER — LIDOCAINE HCL (CARDIAC) 20 MG/ML IV SOLN
INTRAVENOUS | Status: AC
Start: 2017-08-18 — End: ?
  Filled 2017-08-18: qty 5

## 2017-08-18 MED ORDER — HYDROMORPHONE HCL 1 MG/ML IJ SOLN
1.0000 mg | INTRAMUSCULAR | Status: DC | PRN
  Administered 2017-08-18: 1 mg via INTRAVENOUS
  Filled 2017-08-18: qty 1

## 2017-08-18 MED ORDER — LACTATED RINGERS IV SOLN
INTRAVENOUS | Status: DC
  Administered 2017-08-18 (×3): via INTRAVENOUS

## 2017-08-18 MED ORDER — SCOPOLAMINE 1 MG/3DAYS TD PT72
MEDICATED_PATCH | TRANSDERMAL | Status: AC
  Administered 2017-08-18: 1.5 mg via TRANSDERMAL
  Filled 2017-08-18: qty 1

## 2017-08-18 MED ORDER — ONDANSETRON HCL 4 MG/2ML IJ SOLN: 4.0000 mg | Freq: Four times a day (QID) | INTRAMUSCULAR | Status: DC | PRN

## 2017-08-18 MED ORDER — PROPOFOL 10 MG/ML IV BOLUS
INTRAVENOUS | Status: AC
  Filled 2017-08-18: qty 20

## 2017-08-18 MED ORDER — MIDAZOLAM HCL 2 MG/2ML IJ SOLN
INTRAMUSCULAR | Status: DC | PRN
  Administered 2017-08-18: 2 mg via INTRAVENOUS

## 2017-08-18 MED ORDER — ONDANSETRON HCL 4 MG/2ML IJ SOLN
INTRAMUSCULAR | Status: AC
  Filled 2017-08-18: qty 2

## 2017-08-18 MED ORDER — MIDAZOLAM HCL 2 MG/2ML IJ SOLN
INTRAMUSCULAR | Status: AC
  Filled 2017-08-18: qty 2

## 2017-08-18 MED ORDER — SOD CITRATE-CITRIC ACID 500-334 MG/5ML PO SOLN
ORAL | Status: AC
  Administered 2017-08-18: 30 mL via ORAL
  Filled 2017-08-18: qty 15

## 2017-08-18 MED ORDER — AMLODIPINE BESYLATE 10 MG PO TABS
10.0000 mg | ORAL_TABLET | Freq: Every day | ORAL | Status: DC
  Administered 2017-08-18 – 2017-08-19 (×2): 10 mg via ORAL
  Filled 2017-08-18 (×2): qty 1

## 2017-08-18 MED ORDER — METOCLOPRAMIDE HCL 5 MG/ML IJ SOLN: 10.0000 mg | Freq: Once | INTRAMUSCULAR | Status: DC | PRN

## 2017-08-18 MED ORDER — MEPERIDINE HCL 25 MG/ML IJ SOLN: 6.2500 mg | INTRAMUSCULAR | Status: DC | PRN

## 2017-08-18 MED ORDER — SOD CITRATE-CITRIC ACID 500-334 MG/5ML PO SOLN
30.0000 mL | ORAL | Status: AC
  Administered 2017-08-18: 30 mL via ORAL

## 2017-08-18 MED ORDER — LACTATED RINGERS IV SOLN
INTRAVENOUS | Status: DC
  Administered 2017-08-18: 18:00:00 via INTRAVENOUS
  Administered 2017-08-19: 100 mL/h via INTRAVENOUS

## 2017-08-18 MED ORDER — KETOROLAC TROMETHAMINE 30 MG/ML IJ SOLN: 30.0000 mg | Freq: Four times a day (QID) | INTRAMUSCULAR | Status: DC

## 2017-08-18 MED ORDER — SIMETHICONE 80 MG PO CHEW: 80.0000 mg | CHEWABLE_TABLET | Freq: Four times a day (QID) | ORAL | Status: DC | PRN

## 2017-08-18 MED ORDER — KETOROLAC TROMETHAMINE 30 MG/ML IJ SOLN
30.0000 mg | Freq: Four times a day (QID) | INTRAMUSCULAR | Status: AC
  Administered 2017-08-18 – 2017-08-19 (×2): 30 mg via INTRAVENOUS
  Filled 2017-08-18 (×2): qty 1

## 2017-08-18 MED ORDER — LACTATED RINGERS IV SOLN
INTRAVENOUS | Status: DC
  Administered 2017-08-18: 100 mL/h via INTRAVENOUS

## 2017-08-18 MED ORDER — ROCURONIUM BROMIDE 100 MG/10ML IV SOLN
INTRAVENOUS | Status: DC | PRN
  Administered 2017-08-18: 20 mg via INTRAVENOUS
  Administered 2017-08-18: 50 mg via INTRAVENOUS

## 2017-08-18 MED ORDER — FENTANYL CITRATE (PF) 250 MCG/5ML IJ SOLN
INTRAMUSCULAR | Status: AC
  Filled 2017-08-18: qty 5

## 2017-08-18 MED ORDER — ZOLPIDEM TARTRATE 5 MG PO TABS: 5.0000 mg | ORAL_TABLET | Freq: Every evening | ORAL | Status: DC | PRN

## 2017-08-18 MED ORDER — METRONIDAZOLE IN NACL 5-0.79 MG/ML-% IV SOLN
500.0000 mg | INTRAVENOUS | Status: AC
  Administered 2017-08-18: 500 mg via INTRAVENOUS
  Filled 2017-08-18: qty 100

## 2017-08-18 MED ORDER — SUGAMMADEX SODIUM 500 MG/5ML IV SOLN
INTRAVENOUS | Status: AC
  Filled 2017-08-18: qty 5

## 2017-08-18 MED ORDER — ONDANSETRON HCL 4 MG PO TABS: 4.0000 mg | ORAL_TABLET | Freq: Four times a day (QID) | ORAL | Status: DC | PRN

## 2017-08-18 MED ORDER — BUPIVACAINE HCL (PF) 0.5 % IJ SOLN
INTRAMUSCULAR | Status: AC
  Filled 2017-08-18: qty 30

## 2017-08-18 MED ORDER — LIDOCAINE HCL (CARDIAC) 20 MG/ML IV SOLN
INTRAVENOUS | Status: DC | PRN
  Administered 2017-08-18: 100 mg via INTRAVENOUS

## 2017-08-18 MED ORDER — FENTANYL CITRATE (PF) 100 MCG/2ML IJ SOLN
25.0000 ug | INTRAMUSCULAR | Status: DC | PRN
  Administered 2017-08-18: 25 ug via INTRAVENOUS
  Administered 2017-08-18: 50 ug via INTRAVENOUS
  Administered 2017-08-18: 25 ug via INTRAVENOUS
  Administered 2017-08-18: 50 ug via INTRAVENOUS

## 2017-08-18 MED ORDER — PROPOFOL 10 MG/ML IV BOLUS
INTRAVENOUS | Status: DC | PRN
  Administered 2017-08-18: 200 mg via INTRAVENOUS

## 2017-08-18 MED ORDER — FENTANYL CITRATE (PF) 100 MCG/2ML IJ SOLN
INTRAMUSCULAR | Status: AC
  Filled 2017-08-18: qty 2

## 2017-08-18 MED ORDER — OXYCODONE-ACETAMINOPHEN 5-325 MG PO TABS
2.0000 | ORAL_TABLET | ORAL | Status: DC | PRN
  Administered 2017-08-19 (×2): 2 via ORAL
  Filled 2017-08-18 (×2): qty 2

## 2017-08-18 MED ORDER — SOD CITRATE-CITRIC ACID 500-334 MG/5ML PO SOLN: 30.0000 mL | ORAL | Status: AC

## 2017-08-18 MED ORDER — GENTAMICIN SULFATE 40 MG/ML IJ SOLN
5.0000 mg/kg | INTRAVENOUS | Status: DC
  Filled 2017-08-18: qty 9.25

## 2017-08-18 MED ORDER — IBUPROFEN 800 MG PO TABS
800.0000 mg | ORAL_TABLET | Freq: Three times a day (TID) | ORAL | Status: DC
  Administered 2017-08-19 – 2017-08-20 (×3): 800 mg via ORAL
  Filled 2017-08-18 (×3): qty 1

## 2017-08-18 MED ORDER — DEXAMETHASONE SODIUM PHOSPHATE 4 MG/ML IJ SOLN
INTRAMUSCULAR | Status: AC
  Filled 2017-08-18: qty 1

## 2017-08-18 MED ORDER — KETOROLAC TROMETHAMINE 30 MG/ML IJ SOLN
INTRAMUSCULAR | Status: DC | PRN
  Administered 2017-08-18: 30 mg via INTRAVENOUS

## 2017-08-18 MED ORDER — SUGAMMADEX SODIUM 500 MG/5ML IV SOLN
INTRAVENOUS | Status: DC | PRN
  Administered 2017-08-18: 457.6 mg via INTRAVENOUS

## 2017-08-18 MED ORDER — LACTATED RINGERS IV SOLN: INTRAVENOUS | Status: DC

## 2017-08-18 MED ORDER — METRONIDAZOLE IN NACL 5-0.79 MG/ML-% IV SOLN
500.0000 mg | INTRAVENOUS | Status: DC
  Filled 2017-08-18: qty 100

## 2017-08-18 MED ORDER — SENNA 8.6 MG PO TABS
1.0000 | ORAL_TABLET | Freq: Two times a day (BID) | ORAL | Status: DC
  Administered 2017-08-18 – 2017-08-19 (×3): 8.6 mg via ORAL
  Filled 2017-08-18 (×4): qty 1

## 2017-08-18 MED ORDER — DEXAMETHASONE SODIUM PHOSPHATE 10 MG/ML IJ SOLN
INTRAMUSCULAR | Status: DC | PRN
  Administered 2017-08-18: 4 mg via INTRAVENOUS

## 2017-08-18 MED ORDER — HYDROMORPHONE HCL 1 MG/ML IJ SOLN
INTRAMUSCULAR | Status: DC | PRN
  Administered 2017-08-18: 1 mg via INTRAVENOUS

## 2017-08-18 MED ORDER — GENTAMICIN SULFATE 40 MG/ML IJ SOLN
5.0000 mg/kg | INTRAVENOUS | Status: AC
  Administered 2017-08-18: 370 mg via INTRAVENOUS
  Filled 2017-08-18: qty 9.25

## 2017-08-18 MED ORDER — BUPIVACAINE HCL 0.5 % IJ SOLN
INTRAMUSCULAR | Status: DC | PRN
  Administered 2017-08-18: 30 mL

## 2017-08-18 MED ORDER — HYDROMORPHONE HCL 1 MG/ML IJ SOLN
INTRAMUSCULAR | Status: AC
  Filled 2017-08-18: qty 1

## 2017-08-18 MED ORDER — ONDANSETRON HCL 4 MG/2ML IJ SOLN
INTRAMUSCULAR | Status: DC | PRN
  Administered 2017-08-18: 4 mg via INTRAVENOUS

## 2017-08-18 MED ORDER — FENTANYL CITRATE (PF) 100 MCG/2ML IJ SOLN
INTRAMUSCULAR | Status: DC | PRN
  Administered 2017-08-18: 150 ug via INTRAVENOUS
  Administered 2017-08-18 (×2): 100 ug via INTRAVENOUS
  Administered 2017-08-18: 150 ug via INTRAVENOUS

## 2017-08-18 MED ORDER — SCOPOLAMINE 1 MG/3DAYS TD PT72
1.0000 | MEDICATED_PATCH | Freq: Once | TRANSDERMAL | Status: DC
  Administered 2017-08-18: 1.5 mg via TRANSDERMAL

## 2017-08-18 SURGICAL SUPPLY — 44 items
APL SKNCLS STERI-STRIP NONHPOA (GAUZE/BANDAGES/DRESSINGS) ×3
BARRIER ADHS 3X4 INTERCEED (GAUZE/BANDAGES/DRESSINGS) IMPLANT
BENZOIN TINCTURE PRP APPL 2/3 (GAUZE/BANDAGES/DRESSINGS) ×5 IMPLANT
BRR ADH 4X3 ABS CNTRL BYND (GAUZE/BANDAGES/DRESSINGS)
CANISTER SUCT 3000ML PPV (MISCELLANEOUS) ×5 IMPLANT
CLOSURE WOUND 1/2 X4 (GAUZE/BANDAGES/DRESSINGS) ×1
CONT PATH 16OZ SNAP LID 3702 (MISCELLANEOUS) ×5 IMPLANT
DECANTER SPIKE VIAL GLASS SM (MISCELLANEOUS) IMPLANT
DRAPE WARM FLUID 44X44 (DRAPE) IMPLANT
DRSG OPSITE POSTOP 4X10 (GAUZE/BANDAGES/DRESSINGS) ×5 IMPLANT
DURAPREP 26ML APPLICATOR (WOUND CARE) ×5 IMPLANT
GAUZE SPONGE 4X4 16PLY XRAY LF (GAUZE/BANDAGES/DRESSINGS) IMPLANT
GLOVE BIO SURGEON STRL SZ7.5 (GLOVE) ×5 IMPLANT
GLOVE BIOGEL PI IND STRL 7.0 (GLOVE) ×6 IMPLANT
GLOVE BIOGEL PI INDICATOR 7.0 (GLOVE) ×4
GOWN STRL REUS W/TWL LRG LVL3 (GOWN DISPOSABLE) ×10 IMPLANT
GOWN STRL REUS W/TWL XL LVL3 (GOWN DISPOSABLE) ×5 IMPLANT
NEEDLE HYPO 22GX1.5 SAFETY (NEEDLE) ×5 IMPLANT
NS IRRIG 1000ML POUR BTL (IV SOLUTION) ×5 IMPLANT
PACK ABDOMINAL GYN (CUSTOM PROCEDURE TRAY) ×5 IMPLANT
PAD OB MATERNITY 4.3X12.25 (PERSONAL CARE ITEMS) ×5 IMPLANT
PENCIL SMOKE EVAC W/HOLSTER (ELECTROSURGICAL) ×5 IMPLANT
PROTECTOR NERVE ULNAR (MISCELLANEOUS) ×10 IMPLANT
SPONGE LAP 18X18 X RAY DECT (DISPOSABLE) ×10 IMPLANT
STRIP CLOSURE SKIN 1/2X4 (GAUZE/BANDAGES/DRESSINGS) ×4 IMPLANT
SUT SILK 3 0 SH CR/8 (SUTURE) ×3 IMPLANT
SUT VIC AB 0 CT1 18XCR BRD8 (SUTURE) ×9 IMPLANT
SUT VIC AB 0 CT1 27 (SUTURE) ×20
SUT VIC AB 0 CT1 27XBRD ANBCTR (SUTURE) ×12 IMPLANT
SUT VIC AB 0 CT1 8-18 (SUTURE) ×15
SUT VIC AB 1 CT1 18XBRD ANBCTR (SUTURE) IMPLANT
SUT VIC AB 1 CT1 36 (SUTURE) IMPLANT
SUT VIC AB 1 CT1 8-18 (SUTURE)
SUT VIC AB 1 CTX 36 (SUTURE) ×10
SUT VIC AB 1 CTX36XBRD ANBCTRL (SUTURE) ×6 IMPLANT
SUT VIC AB 3-0 CT1 27 (SUTURE)
SUT VIC AB 3-0 CT1 TAPERPNT 27 (SUTURE) IMPLANT
SUT VIC AB 3-0 SH 27 (SUTURE)
SUT VIC AB 3-0 SH 27X BRD (SUTURE) IMPLANT
SUT VIC AB 4-0 KS 27 (SUTURE) ×5 IMPLANT
SUT VICRYL 1 TIES 12X18 (SUTURE) ×5 IMPLANT
SYR CONTROL 10ML LL (SYRINGE) ×5 IMPLANT
TOWEL OR 17X24 6PK STRL BLUE (TOWEL DISPOSABLE) ×10 IMPLANT
TRAY FOLEY CATH SILVER 14FR (SET/KITS/TRAYS/PACK) ×5 IMPLANT

## 2017-08-18 NOTE — H&P (Signed)
Kaitlyn Whitney is an 52 y.o. G0P0 female with known uterine fibroids. Pt has been having abd pain thought to be related to her fibroids.Has been to the ER for several visits for pain. Also left ovarian cyst which has not resolved from last U/S. She reports no cycle since July. Some menopausal Sx but able to handle thus far.  Due to pain she desires definite therapy.    Menstrual History: Menarche age: 60 No LMP recorded. Patient is not currently having periods (Reason: Perimenopausal).    Past Medical History:  Diagnosis Date  . Anxiety   . Arthritis    hands  . History of kidney stones 2016  . Hypertension   . Obesity   . Restless leg syndrome   . Sleep apnea    c-pap    Past Surgical History:  Procedure Laterality Date  . BREAST SURGERY    . LAPAROSCOPIC GASTRIC BANDING    . TUBAL LIGATION  2005    Family History  Problem Relation Age of Onset  . Asthma Mother   . Cancer Father   . Drug abuse Father   . Drug abuse Brother   . Diabetes Brother   . Drug abuse Brother   . Heart attack Brother   . Asthma Other   . Hypertension Other   . Stroke Other     Social History:  reports that she has been smoking cigarettes.  She has a 3.00 pack-year smoking history. she has never used smokeless tobacco. She reports that she does not drink alcohol or use drugs.  Allergies:  Allergies  Allergen Reactions  . Penicillins Other (See Comments)    Yeast infection Has patient had a PCN reaction causing immediate rash, facial/tongue/throat swelling, SOB or lightheadedness with hypotension: No Has patient had a PCN reaction causing severe rash involving mucus membranes or skin necrosis: No Has patient had a PCN reaction that required hospitalization: Unknown Has patient had a PCN reaction occurring within the last 10 years: No If all of the above answers are "NO", then may proceed with Cephalosporin use.    Medications Prior to Admission  Medication Sig Dispense Refill Last Dose   . acetaminophen (TYLENOL) 500 MG tablet Take 500 mg by mouth 3 (three) times daily as needed for moderate pain or headache.   08/17/2017 at Unknown time  . amLODipine (NORVASC) 10 MG tablet Take 10 mg by mouth daily.    08/17/2017 at 0730  . diphenhydrAMINE (BENADRYL) 25 mg capsule Take 25 mg by mouth daily as needed for allergies.   Past Week at Unknown time  . varenicline (CHANTIX) 1 MG tablet Take 1 mg by mouth 2 (two) times daily.   Past Month at Unknown time  . zolpidem (AMBIEN) 10 MG tablet TAKE 1 TABLET (10 MG TOTAL) BY MOUTH NIGHTLY AS NEEDED FOR UP TO 30 DAYS FOR SLEEP.  3 Past Week at Unknown time  . POTASSIUM PO Take 1 tablet by mouth daily as needed (leg cramps).   More than a month at Unknown time    Review of Systems  Constitutional: Negative.   Respiratory: Negative.   Cardiovascular: Negative.   Gastrointestinal: Negative.   Genitourinary: Negative.     Blood pressure 131/82, pulse 81, temperature 97.6 F (36.4 C), temperature source Oral, resp. rate 16, height 5' 0.5" (1.537 m), weight 114.4 kg (252 lb 2 oz), SpO2 100 %. Physical Exam  Constitutional: She appears well-developed and well-nourished.  Cardiovascular: Normal rate and regular rhythm.  Respiratory:  Effort normal and breath sounds normal.  GI: Soft. Bowel sounds are normal.  Abd/pelvic mass affect to just below umbilicus, slightly tender  Genitourinary:  Genitourinary Comments: Nl EGBUS uterus 16-18 weeks    Results for orders placed or performed during the hospital encounter of 08/18/17 (from the past 24 hour(s))  Pregnancy, urine     Status: None   Collection Time: 08/18/17 12:00 PM  Result Value Ref Range   Preg Test, Ur NEGATIVE NEGATIVE     Assessment/Plan: Uterine fibroids Ovarian cyst  Pt being admitted today for TAH.BS and probable left oophorectomy R/B/Post op care reviewed with pt. Verbalized understanding and agrees to proceed.  [name] 08/18/2017, 12:48 PM

## 2017-08-18 NOTE — Anesthesia Preprocedure Evaluation (Signed)
Anesthesia Evaluation  Patient identified by MRN, date of birth, ID band Patient awake    Reviewed: Allergy & Precautions, NPO status , Patient's Chart, lab work & pertinent test results  Airway Mallampati: II  TM Distance: >3 FB Neck ROM: Full    Dental no notable dental hx.    Pulmonary sleep apnea and Continuous Positive Airway Pressure Ventilation , Current Smoker,    Pulmonary exam normal breath sounds clear to auscultation       Cardiovascular hypertension, Pt. on medications Normal cardiovascular exam Rhythm:Regular Rate:Normal     Neuro/Psych Anxiety Depression negative neurological ROS     GI/Hepatic negative GI ROS, Neg liver ROS,   Endo/Other  Morbid obesity  Renal/GU negative Renal ROS  negative genitourinary   Musculoskeletal negative musculoskeletal ROS (+)   Abdominal   Peds negative pediatric ROS (+)  Hematology negative hematology ROS (+)   Anesthesia Other Findings   Reproductive/Obstetrics negative OB ROS                             Anesthesia Physical Anesthesia Plan  ASA: III  Anesthesia Plan: General   Post-op Pain Management:    Induction: Intravenous  PONV Risk Score and Plan: 3 and Ondansetron, Dexamethasone, Midazolam and Scopolamine patch - Pre-op  Airway Management Planned: Oral ETT  Additional Equipment:   Intra-op Plan:   Post-operative Plan: Extubation in OR  Informed Consent: I have reviewed the patients History and Physical, chart, labs and discussed the procedure including the risks, benefits and alternatives for the proposed anesthesia with the patient or authorized representative who has indicated his/her understanding and acceptance.   Dental advisory given  Plan Discussed with: CRNA  Anesthesia Plan Comments:         Anesthesia Quick Evaluation

## 2017-08-18 NOTE — Anesthesia Procedure Notes (Signed)
Procedure Name: Intubation Date/Time: 08/18/2017 2:09 PM Performed by: Jonna Munro, CRNA Pre-anesthesia Checklist: Patient identified, Emergency Drugs available, Suction available, Patient being monitored and Timeout performed Patient Re-evaluated:Patient Re-evaluated prior to induction Oxygen Delivery Method: Circle system utilized Preoxygenation: Pre-oxygenation with 100% oxygen Induction Type: IV induction Ventilation: Mask ventilation without difficulty Laryngoscope Size: Mac and 3 Grade View: Grade I Tube type: Oral Number of attempts: 1 Airway Equipment and Method: Stylet Placement Confirmation: ETT inserted through vocal cords under direct vision,  positive ETCO2 and breath sounds checked- equal and bilateral Secured at: 21 cm Tube secured with: Tape Dental Injury: Teeth and Oropharynx as per pre-operative assessment

## 2017-08-18 NOTE — Op Note (Signed)
PREOPERATIVE DIAGNOSIS:  Symptomatic fibroids, pelvic pain and ovarian cyst POSTOPERATIVE DIAGNOSIS:  SAA plus pelvic adhesive disaese SURGEON:   Arlina Robes, M.D. ASSISTANT: Clovia Cuff, M.D. OPERATION:   Supracervical abdominal hysterectomy, Bilateral Salpingoohorectomy ANESTHESIA:  General endotracheal.  INDICATIONS: Kaitlyn Whitney has  the aforementioned diagnoses who desires definitive surgical management. On the preoperative visit, the risks, benefits, indications, and alternatives of the procedure were reviewed with the patient.  On the day of surgery, the risks of surgery were again discussed with the patient including but not limited to: bleeding which may require transfusion or reoperation; infection which may require antibiotics; injury to bowel, bladder, ureters or other surrounding organs; need for additional procedures; thromboembolic phenomenon, incisional problems and other postoperative/anesthesia complications. Written informed consent was obtained.    OPERATIVE FINDINGS: A 12 week size uterus with excessive dense abd/pelvic adhesions involving the omentum anterior abd wall, bowel adhesions to bowel, adhesions involving both ovaries and uterus.    ESTIMATED BLOOD LOSS: 200 ml FLUIDS: As recorded URINE OUTPUT:  As recorded SPECIMENS:  Uterus,  bilateral fallopian tubes (and ovaries) sent to pathology COMPLICATIONS:  None immediate.   DESCRIPTION OF PROCEDURE:  The patient received intravenous antibiotics and had sequential compression devices applied to her lower extremities while in the preoperative area.   She was taken to the operating room and placed under general anesthesia without difficulty.The abdomen and perineum were prepped and draped in a sterile manner, and she was placed in a dorsal supine position.  A Foley catheter was inserted into the bladder and attached to constant drainage. After an adequate timeout was performed, a Pfannensteil skin incision was made. This  incision was taken down to the fascia using electrocautery with care given to maintain good hemostasis. The fascia was incised in the midline and the fascial incision was then extended bilaterally using electrocautery without difficulty. The fascia was then dissected off the underlying rectus muscles using blunt and sharp dissection. The rectus muscles were split bluntly in the midline, I encountered thick adhesions and these were sharply taken down for approximately 15 minutes.  The peritoneum was then entered. An additional 15 minutes of sharp dissection was under taken to a lot exposure of the uterus. Bowel was then packed out of the operative field. The round ligaments and IP on each side were clamped, suture ligated with 0 Vicryl, and cut. Of note, all sutures used in this procedure are 0 Vicryl unless otherwise noted. A bladder flap was then created with sharp and blunt dissection. The uterine arteries were then skeletonized bilaterally and then clamped, cut, and  suture ligated.  The uterus was then amputated across the lower uterine segment leaving the cervix intact. The specimen was sent to pathology. The cervical canal was coagulated, and the anterior and posterior peritoneal edges were then reapproximated in the midline over the cervical stump without complication.   Attention was then directed the right ovary and tube which was grasped with a Babcock clamp. The IP was clamped and the specimen was removed. The IP was secured with a free tie. Hemostasis was noted. The adhesive disease incoming the left ovary, tube, bowel and pelvic side wall was sharply and dissected to free up the ovary and tube. The IP was clamped and the specimen was removed. Hemostatsis was noted. There was a inclusion cyst noted in the bowel next to the left ovary which was bluntly dissected away and removed without problems.   The pelvis was irrigated and hemostasis was reconfirmed at  all pedicles and along the pelvic sidewall.  The  abd muscles were reapproximated with 0 vicryl. The fascia was closed with #1 Vicryl.   The subcutaneous layer was reapproximated with 2-0 plain gut. The skin was closed with a 4-0 Vicryl subcuticular stitch. Sponge, lap, needle, and instrument counts were correct times two. The patient was taken to the recovery area awake, extubated and in stable condition.  Arlina Robes, MD, [place] Attending [place], [facility]

## 2017-08-18 NOTE — Transfer of Care (Signed)
Immediate Anesthesia Transfer of Care Note  Patient: Kaitlyn Whitney  Procedure(s) Performed: SUPRACERVICAL HYSTERECTOMY ABDOMINAL (Bilateral Abdomen) SALPINGO OOPHORECTOMY (Bilateral Abdomen)  Patient Location: PACU  Anesthesia Type:General  Level of Consciousness: awake, alert  and oriented  Airway & Oxygen Therapy: Patient Spontanous Breathing and Patient connected to nasal cannula oxygen  Post-op Assessment: Report given to RN and Post -op Vital signs reviewed and stable  Post vital signs: Reviewed and stable  Last Vitals:  Vitals:   08/18/17 1208  BP: 131/82  Pulse: 81  Resp: 16  Temp: 36.4 C  SpO2: 100%    Last Pain:  Vitals:   08/18/17 1208  TempSrc: Oral  PainSc: 4       Patients Stated Pain Goal: 4 (35/70/17 7939)  Complications: No apparent anesthesia complications

## 2017-08-19 ENCOUNTER — Encounter (HOSPITAL_COMMUNITY): Payer: Self-pay | Admitting: Obstetrics and Gynecology

## 2017-08-19 LAB — CBC
HEMATOCRIT: 33.6 % — AB (ref 36.0–46.0)
HEMOGLOBIN: 11.5 g/dL — AB (ref 12.0–15.0)
MCH: 30.6 pg (ref 26.0–34.0)
MCHC: 34.2 g/dL (ref 30.0–36.0)
MCV: 89.4 fL (ref 78.0–100.0)
Platelets: 247 10*3/uL (ref 150–400)
RBC: 3.76 MIL/uL — ABNORMAL LOW (ref 3.87–5.11)
RDW: 14 % (ref 11.5–15.5)
WBC: 9.1 10*3/uL (ref 4.0–10.5)

## 2017-08-19 LAB — BASIC METABOLIC PANEL
Anion gap: 6 (ref 5–15)
BUN: 8 mg/dL (ref 6–20)
CHLORIDE: 104 mmol/L (ref 101–111)
CO2: 23 mmol/L (ref 22–32)
CREATININE: 0.48 mg/dL (ref 0.44–1.00)
Calcium: 7.8 mg/dL — ABNORMAL LOW (ref 8.9–10.3)
GFR calc non Af Amer: >60 mL/min (ref >60)
Glucose, Bld: 109 mg/dL — ABNORMAL HIGH (ref 65–99)
Potassium: 3.7 mmol/L (ref 3.5–5.1)
Sodium: 133 mmol/L — ABNORMAL LOW (ref 135–145)

## 2017-08-19 MED ORDER — CYCLOBENZAPRINE HCL 10 MG PO TABS
10.0000 mg | ORAL_TABLET | Freq: Three times a day (TID) | ORAL | Status: DC | PRN
  Administered 2017-08-19: 10 mg via ORAL
  Filled 2017-08-19 (×2): qty 1

## 2017-08-19 NOTE — Progress Notes (Signed)
1 Day Post-Op Procedure(s) (LRB): SUPRACERVICAL HYSTERECTOMY ABDOMINAL (Bilateral) SALPINGO OOPHORECTOMY (Bilateral) LYSIS OF ADHESION (N/A)  Subjective: Kaitlyn Whitney is without complaints today. Ambulating, voiding, + flatus, tolerating diet and good oral pain control.  Surgery was reviewed with pt.   Objective: AF VSS Good UOP Lungs clear Heart RRR Abd soft + BS drsg intact Ext non tender    Assessment: s/p Procedure(s): SUPRACERVICAL HYSTERECTOMY ABDOMINAL (Bilateral) SALPINGO OOPHORECTOMY (Bilateral) LYSIS OF ADHESION (N/A): stable  Plan: Continue with progressive care. Probable discharge tomorrow.   LOS: 1 day    [name] 08/19/2017, 5:51 PM

## 2017-08-19 NOTE — Progress Notes (Signed)
Spoke with patient about her home CPAP unit and her ability to place herself on it for the night. Patient stated that she has a good working knowledge of the home CPAP and that she has the ability to place the full-face mask on herself with the nasal cannula in place. Patient stated she did not use humidity with her CPAP. No complications noted.

## 2017-08-19 NOTE — Anesthesia Postprocedure Evaluation (Signed)
Anesthesia Post Note  Patient: Kaitlyn Whitney  Procedure(s) Performed: SUPRACERVICAL HYSTERECTOMY ABDOMINAL (Bilateral Abdomen) SALPINGO OOPHORECTOMY (Bilateral Abdomen) LYSIS OF ADHESION (N/A Abdomen)     Patient location during evaluation: [facility] Anesthesia Type: General Level of consciousness: awake Pain management: satisfactory to patient Vital Signs Assessment: post-procedure vital signs reviewed and stable Respiratory status: spontaneous breathing Cardiovascular status: stable Anesthetic complications: no    Last Vitals:  Vitals:   08/19/17 0630 08/19/17 0750  BP: 112/67 103/61  Pulse: 74 73  Resp: 17 16  Temp: 36.6 C 36.6 C  SpO2: 98% 98%    Last Pain:  Vitals:   08/19/17 0750  TempSrc: Oral  PainSc:    Pain Goal: Patients Stated Pain Goal: 4 (08/18/17 2155)               Casimer Lanius

## 2017-08-20 MED ORDER — OXYCODONE-ACETAMINOPHEN 5-325 MG PO TABS: 2.0000 | ORAL_TABLET | ORAL | 0 refills | Status: DC | PRN

## 2017-08-20 MED ORDER — IBUPROFEN 800 MG PO TABS: 800.0000 mg | ORAL_TABLET | Freq: Three times a day (TID) | ORAL | 0 refills | Status: DC

## 2017-08-20 NOTE — Progress Notes (Signed)
Pt out in wheelchair  Teaching complete   

## 2017-08-20 NOTE — Discharge Instructions (Signed)
Abdominal Hysterectomy, Care After °This sheet gives you information about how to care for yourself after your procedure. Your doctor may also give you more specific instructions. If you have problems or questions, contact your doctor. °Follow these instructions at home: °Bathing °· Do not take baths, swim, or use a hot tub until your doctor says it is okay. Ask your doctor if you can take showers. You may only be allowed to take sponge baths for bathing. °· Keep the bandage (dressing) dry until your doctor says it can be taken off. °Surgical cut ( °incision) care °· Follow instructions from your doctor about how to take care of your cut from surgery. Make sure you: °? Wash your hands with soap and water before you change your bandage (dressing). If you cannot use soap and water, use hand sanitizer. °? Change your bandage as told by your doctor. °? Leave stitches (sutures), skin glue, or skin tape (adhesive) strips in place. They may need to stay in place for 2 weeks or longer. If tape strips get loose and curl up, you may trim the loose edges. Do not remove tape strips completely unless your doctor says it is okay. °· Check your surgical cut area every day for signs of infection. Check for: °? Redness, swelling, or pain. °? Fluid or blood. °? Warmth. °? Pus or a bad smell. °Activity °· Do gentle, daily exercise as told by your doctor. You may be told to take short walks every day and go farther each time. °· Do not lift anything that is heavier than 10 lb (4.5 kg), or the limit that your doctor tells you, until he or she says that it is safe. °· Do not drive or use heavy machinery while taking prescription pain medicine. °· Do not drive for 24 hours if you were given a medicine to help you relax (sedative). °· Follow your doctor's advice about exercise, driving, and general activities. Ask your doctor what activities are safe for you. °Lifestyle °· Do not douche, use tampons, or have sex for at least 6 weeks or as  told by your doctor. °· Do not drink alcohol until your doctor says it is okay. °· Drink enough fluid to keep your pee (urine) clear or pale yellow. °· Try to have someone at home with you for the first 1-2 weeks to help. °· Do not use any products that contain nicotine or tobacco, such as cigarettes and e-cigarettes. These can slow down healing. If you need help quitting, ask your doctor. °General instructions °· Take over-the-counter and prescription medicines only as told by your doctor. °· Do not take aspirin or ibuprofen. These medicines can cause bleeding. °· To prevent or treat constipation while you are taking prescription pain medicine, your doctor may suggest that you: °? Drink enough fluid to keep your urine clear or pale yellow. °? Take over-the-counter or prescription medicines. °? Eat foods that are high in fiber, such as: °§ Fresh fruits and vegetables. °§ Whole grains. °§ Beans. °? Limit foods that are high in fat and processed sugars, such as fried and sweet foods. °· Keep all follow-up visits as told by your doctor. This is important. °Contact a doctor if: °· You have chills or fever. °· You have redness, swelling, or pain around your cut. °· You have fluid or blood coming from your cut. °· Your cut feels warm to the touch. °· You have pus or a bad smell coming from your cut. °· Your cut breaks   open. °· You feel dizzy or light-headed. °· You have pain or bleeding when you pee. °· You keep having watery poop (diarrhea). °· You keep feeling sick to your stomach (nauseous) or keep throwing up (vomiting). °· You have unusual fluid (discharge) coming from your vagina. °· You have a rash. °· You have a reaction to your medicine. °· Your pain medicine does not help. °Get help right away if: °· You have a fever and your symptoms get worse all of a sudden. °· You have very bad belly (abdominal) pain. °· You are short of breath. °· You pass out (faint). °· You have pain, swelling, or redness of your  leg. °· You bleed a lot from your vagina and notice clumps of blood (clots). °Summary °· Do not take baths, swim, or use a hot tub until your doctor says it is okay. Ask your doctor if you can take showers. You may only be allowed to take sponge baths for bathing. °· Follow your doctor's advice about exercise, driving, and general activities. Ask your doctor what activities are safe for you. °· Do not lift anything that is heavier than 10 lb (4.5 kg), or the limit that your doctor tells you, until he or she says that it is safe. °· Try to have someone at home with you for the first 1-2 weeks to help. °This information is not intended to replace advice given to you by your health care provider. Make sure you discuss any questions you have with your health care provider. °Document Released: 03/11/2008 Document Revised: 05/21/2016 Document Reviewed: 05/21/2016 °Elsevier Interactive Patient Education © 2017 Elsevier Inc[employer] ° °

## 2017-08-20 NOTE — Discharge Summary (Signed)
Physician Discharge Summary  Patient ID: Kaitlyn Whitney MRN: [id number] DOB/AGE: 1966-01-04 52 y.o.  Admit date: 08/18/2017 Discharge date: 08/20/2017  Admission Diagnoses: Uterine fibroids, left ovarian cyst and pelvic pain  Discharge Diagnoses:  Active Problems:   Post-operative state   Discharged Condition: good  Hospital Course: Kaitlyn Whitney was admitted with above DX. She underwent supracervical hysterectomy,LOA and BSO. See OP note for additional information. Post Op course was unremarkable. Progressed to ambulating, voiding, + flatus, tolerating diet and good oral pain control. Felt amendable for discharge home on POD # 2. Discharge medications and instructions reviewed with pt.   Consults: None  Significant Diagnostic Studies: labs  Treatments: surgery: Supracervical hysterectomy, BSO and LOA  Discharge Exam: Blood pressure 112/83, pulse 74, temperature 98.2 F (36.8 C), temperature source Oral, resp. rate 18, height 5' 0.5" (1.537 m), weight 114.4 kg (252 lb 2 oz), SpO2 100 %.  Lungs clear Heart RRR Abd soft + BS drsg intact GU no bleeding Ext non tender  Disposition: 01-Home or Self Care  Discharge Instructions    Call MD for:  difficulty breathing, headache or visual disturbances   Complete by:  As directed    Call MD for:  extreme fatigue   Complete by:  As directed    Call MD for:  hives   Complete by:  As directed    Call MD for:  persistant dizziness or light-headedness   Complete by:  As directed    Call MD for:  persistant nausea and vomiting   Complete by:  As directed    Call MD for:  redness, tenderness, or signs of infection (pain, swelling, redness, odor or green/yellow discharge around incision site)   Complete by:  As directed    Call MD for:  severe uncontrolled pain   Complete by:  As directed    Call MD for:  temperature >100.4   Complete by:  As directed    Diet - low sodium heart healthy   Complete by:  As directed    Discharge wound care:    Complete by:  As directed    May remove honeycomb dressing nest Tuesday   Increase activity slowly   Complete by:  As directed    Sexual Activity Restrictions   Complete by:  As directed    Pelvic rest x 6 weeks     Allergies as of 08/20/2017      Reactions   Penicillins Other (See Comments)   Yeast infection Has patient had a PCN reaction causing immediate rash, facial/tongue/throat swelling, SOB or lightheadedness with hypotension: No Has patient had a PCN reaction causing severe rash involving mucus membranes or skin necrosis: No Has patient had a PCN reaction that required hospitalization: Unknown Has patient had a PCN reaction occurring within the last 10 years: No If all of the above answers are "NO", then may proceed with Cephalosporin use.      Medication List    STOP taking these medications   acetaminophen 500 MG tablet Commonly known as:  TYLENOL     TAKE these medications   amLODipine 10 MG tablet Commonly known as:  NORVASC Take 10 mg by mouth daily.   diphenhydrAMINE 25 mg capsule Commonly known as:  BENADRYL Take 25 mg by mouth daily as needed for allergies.   ibuprofen 800 MG tablet Commonly known as:  ADVIL,MOTRIN Take 1 tablet (800 mg total) by mouth every 8 (eight) hours.   oxyCODONE-acetaminophen 5-325 MG tablet Commonly known as:  PERCOCET/ROXICET Take 2 tablets by mouth every 4 (four) hours as needed for severe pain ((when tolerating fluids)).   POTASSIUM PO Take 1 tablet by mouth daily as needed (leg cramps).   varenicline 1 MG tablet Commonly known as:  CHANTIX Take 1 mg by mouth 2 (two) times daily.   zolpidem 10 MG tablet Commonly known as:  AMBIEN TAKE 1 TABLET (10 MG TOTAL) BY MOUTH NIGHTLY AS NEEDED FOR UP TO 30 DAYS FOR SLEEP.            Discharge Care Instructions  (From admission, onward)        Start     Ordered   08/20/17 0000  Discharge wound care:    Comments:  May remove honeycomb dressing nest Tuesday   08/20/17  0909     Follow-up Information    [facility]. Schedule an appointment as soon as possible for a visit in 4 week(s).   Why:  Post Op appt with Dr. Gardiner Fanti Contact information: [address] [place] [address] [phone]          Signed: [name] 08/20/2017, 9:10 AM

## 2017-08-21 ENCOUNTER — Inpatient Hospital Stay (HOSPITAL_COMMUNITY)
Admission: AD | Admit: 2017-08-21 | Discharge: 2017-08-21 | Disposition: A | Discharge status: Home / Self Care | Payer: BLUE CROSS/BLUE SHIELD | Source: Ambulatory Visit | Attending: Obstetrics & Gynecology | Admitting: Obstetrics & Gynecology

## 2017-08-21 ENCOUNTER — Encounter (HOSPITAL_COMMUNITY): Payer: Self-pay

## 2017-08-21 DIAGNOSIS — I10 Essential (primary) hypertension: Secondary | ICD-10-CM | POA: Insufficient documentation

## 2017-08-21 DIAGNOSIS — F419 Anxiety disorder, unspecified: Secondary | ICD-10-CM | POA: Diagnosis not present

## 2017-08-21 DIAGNOSIS — Z87442 Personal history of urinary calculi: Secondary | ICD-10-CM | POA: Insufficient documentation

## 2017-08-21 DIAGNOSIS — R200 Anesthesia of skin: Secondary | ICD-10-CM | POA: Insufficient documentation

## 2017-08-21 DIAGNOSIS — R109 Unspecified abdominal pain: Secondary | ICD-10-CM | POA: Diagnosis not present

## 2017-08-21 DIAGNOSIS — M79652 Pain in left thigh: Secondary | ICD-10-CM

## 2017-08-21 DIAGNOSIS — Z880 Allergy status to penicillin: Secondary | ICD-10-CM | POA: Insufficient documentation

## 2017-08-21 DIAGNOSIS — E669 Obesity, unspecified: Secondary | ICD-10-CM | POA: Diagnosis not present

## 2017-08-21 DIAGNOSIS — F17210 Nicotine dependence, cigarettes, uncomplicated: Secondary | ICD-10-CM | POA: Diagnosis not present

## 2017-08-21 DIAGNOSIS — G4730 Sleep apnea, unspecified: Secondary | ICD-10-CM | POA: Diagnosis not present

## 2017-08-21 DIAGNOSIS — Z79899 Other long term (current) drug therapy: Secondary | ICD-10-CM | POA: Insufficient documentation

## 2017-08-21 DIAGNOSIS — G2581 Restless legs syndrome: Secondary | ICD-10-CM | POA: Insufficient documentation

## 2017-08-21 NOTE — Discharge Instructions (Signed)
Musculoskeletal Pain Musculoskeletal pain is muscle and bone aches and pains. This pain can occur in any part of the body. Follow these instructions at home:  Only take medicines for pain, discomfort, or fever as told by your health care provider.  You may continue all activities unless the activities cause more pain. When the pain lessens, slowly resume normal activities. Gradually increase the intensity and duration of the activities or exercise.  During periods of severe pain, bed rest may be helpful. Lie or sit in any position that is comfortable, but get out of bed and walk around at least every several hours.  If directed, put ice on the injured area. ? Put ice in a plastic bag. ? Place a towel between your skin and the bag. ? Leave the ice on for 20 minutes, 2-3 times a day. Contact a health care provider if:  Your pain is getting worse.  Your pain is not relieved with medicines.  You lose function in the area of the pain if the pain is in your arms, legs, or neck. This information is not intended to replace advice given to you by your health care provider. Make sure you discuss any questions you have with your health care provider. Document Released: 06/02/2005 Document Revised: 11/13/2015 Document Reviewed: 02/04/2013 Elsevier Interactive Patient Education  2017 [place] Nerve A pinched nerve is a type of injury that occurs when too much pressure is placed on a nerve. This pressure can cause pain, burning, and muscle weakness in places such as your arm, hand, back, leg, or neck. A nerve can become permanently damaged if it is severely pinched or if it has been pinched for a long time. What are the causes? This condition may be caused by:  The passing of a nerve through a narrow area between bones or other body structures.  Loss of blood supply to a nerve.  A nerve being stretched from an injury.  A sudden injury with swelling.  Wear and tear that occurs  over several years.  Changes that occur in the spine with age.  What are the signs or symptoms? The most common symptom of a pinched nerve is a tingling feeling or numbness. Other symptoms include:  Pain that radiates from the affected nerve to the body part that the nerve supplies.  A burning feeling.  Muscle weakness in the muscles supplied by the injured nerve.  How is this diagnosed? This condition is diagnosed with a physical exam. During the exam, a health care provider will check for numbness and muscle weakness and move the affected body parts to test for pain. You may also have other tests, such as:  X-rays to check for bone damage.  An MRI or CT scan to check for nerve damage.  Electromyography (EMG) to check for electrical signals passing through nerves to muscles.  How is this treated? The first treatment for a pinched nerve is usually rest and supportive devices, such as a splint, brace, or neck collar. Additional treatment depends on symptoms and the amount of nerve damage. This can include:  Medicines, such as: ? Numbing medicine injections. ? Nonsteroidal anti-inflammatory drugs (NSAIDs). ? Steroid medicines in pill form or by injection.  Physical therapy to relieve pain, maintain movement, and improve muscle strength.  Surgery. This may be done if other treatments do not work.  Follow these instructions at home:  Only take medicines as directed by your health care provider.  Wear supportive or protective devices as  directed by your health care provider.  Do stretching and strengthening exercises at home as directed by your health care provider.  Rest as needed.  Keep all follow-up visits as directed by your health care provider. This is important. Contact a health care provider if:  Your condition does not improve with treatment.  Your pain, numbness, or weakness suddenly gets worse. This information is not intended to replace advice given to you by  your health care provider. Make sure you discuss any questions you have with your health care provider. Document Released: 05/23/2002 Document Revised: 11/08/2015 Document Reviewed: 03/08/2014 Elsevier Interactive Patient Education  [employer].

## 2017-08-21 NOTE — MAU Note (Signed)
Left leg numbness since leaving the hospital earlier today.  Pain started 30 minutes ago.  Patient appears to be limping.  Had hysterectomy on 3/5.  Also experiencing abdominal pain that she states is her normal post-op pain.  Took oxycodone at 2200 for the pain and it helped.

## 2017-08-21 NOTE — MAU Provider Note (Signed)
Chief Complaint:  Leg Pain   First Provider Initiated Contact with Patient 08/21/17 0528     HPI: Kaitlyn Whitney is a 52 y.o. G0P0 who presents to maternity admissions reporting numbness and sharp burning pain in her left upper thigh.  Feels it from hip to knee, anteriorly.  Not sure if it happened in hospital   Husband says she did mention it there.  . She reports vaginal bleeding, but no vaginal itching/burning, urinary symptoms, h/a, dizziness, n/v, or fever/chills.    Leg Pain   The incident occurred less than 1 hour ago. The incident occurred at home. The injury mechanism is unknown. The pain is present in the left leg. The quality of the pain is described as burning (and numbness). The pain has been intermittent since onset. Associated symptoms include a loss of sensation and numbness. Pertinent negatives include no loss of motion or muscle weakness. She reports no foreign bodies present. The symptoms are aggravated by weight bearing. She has tried nothing for the symptoms.    RN note: Left leg numbness since leaving the hospital earlier today.  Pain started 30 minutes ago.  Patient appears to be limping.  Had hysterectomy on 3/5.  Also experiencing abdominal pain that she states is her normal post-op pain.  Took oxycodone at 2200 for the pain and it helped.      Past Medical History: Past Medical History:  Diagnosis Date  . Anxiety   . Arthritis    hands  . History of kidney stones 2016  . Hypertension   . Obesity   . Restless leg syndrome   . Sleep apnea    c-pap    Past obstetric history: OB History  Gravida Para Term Preterm AB Living  0            SAB TAB Ectopic Multiple Live Births                   Past Surgical History: Past Surgical History:  Procedure Laterality Date  . ABDOMINAL HYSTERECTOMY    . BREAST SURGERY    . HYSTERECTOMY ABDOMINAL WITH SALPINGECTOMY Bilateral 08/18/2017   Procedure: SUPRACERVICAL HYSTERECTOMY ABDOMINAL;  Surgeon: [name],  MD;  Location: [facility];  Service: Gynecology;  Laterality: Bilateral;  . LAPAROSCOPIC GASTRIC BANDING    . LYSIS OF ADHESION N/A 08/18/2017   Procedure: LYSIS OF ADHESION;  Surgeon: [name], MD;  Location: [facility];  Service: Gynecology;  Laterality: N/A;  . SALPINGOOPHORECTOMY Bilateral 08/18/2017   Procedure: SALPINGO OOPHORECTOMY;  Surgeon: [name], MD;  Location: [facility];  Service: Gynecology;  Laterality: Bilateral;  . TUBAL LIGATION  2005    Family History: Family History  Problem Relation Age of Onset  . Asthma Mother   . Cancer Father   . Drug abuse Father   . Drug abuse Brother   . Diabetes Brother   . Drug abuse Brother   . Heart attack Brother   . Asthma Other   . Hypertension Other   . Stroke Other     Social History: Social History   Tobacco Use  . Smoking status: Current Every Day Smoker    Packs/day: 0.10    Years: 30.00    Pack years: 3.00    Types: Cigarettes  . Smokeless tobacco: Never Used  . Tobacco comment: 10 cig per day  Substance Use Topics  . Alcohol use: No  . Drug use: No    Allergies:  Allergies  Allergen Reactions  . Penicillins Other (See Comments)    Yeast infection Has patient had a PCN reaction causing immediate rash, facial/tongue/throat swelling, SOB or lightheadedness with hypotension: No Has patient had a PCN reaction causing severe rash involving mucus membranes or skin necrosis: No Has patient had a PCN reaction that required hospitalization: Unknown Has patient had a PCN reaction occurring within the last 10 years: No If all of the above answers are "NO", then may proceed with Cephalosporin use.    Meds:  Medications Prior to Admission  Medication Sig Dispense Refill Last Dose  . oxyCODONE-acetaminophen (PERCOCET/ROXICET) 5-325 MG tablet Take 2 tablets by mouth every 4 (four) hours as needed for severe pain ((when tolerating fluids)). 30 tablet 0 08/20/2017 at 2200  . amLODipine (NORVASC) 10 MG tablet Take 10 mg  by mouth daily.    08/17/2017 at 0730  . diphenhydrAMINE (BENADRYL) 25 mg capsule Take 25 mg by mouth daily as needed for allergies.   Past Week at Unknown time  . ibuprofen (ADVIL,MOTRIN) 800 MG tablet Take 1 tablet (800 mg total) by mouth every 8 (eight) hours. 30 tablet 0   . POTASSIUM PO Take 1 tablet by mouth daily as needed (leg cramps).   More than a month at Unknown time  . varenicline (CHANTIX) 1 MG tablet Take 1 mg by mouth 2 (two) times daily.   Past Month at Unknown time  . zolpidem (AMBIEN) 10 MG tablet TAKE 1 TABLET (10 MG TOTAL) BY MOUTH NIGHTLY AS NEEDED FOR UP TO 30 DAYS FOR SLEEP.  3 Past Week at Unknown time    I have reviewed patient's Past Medical Hx, Surgical Hx, Family Hx, Social Hx, medications and allergies.  ROS:  Review of Systems  Constitutional: Negative for chills and fever.  Respiratory: Negative for shortness of breath.   Gastrointestinal: Positive for abdominal pain.  Musculoskeletal: Positive for gait problem.  Neurological: Positive for numbness.   Other systems negative     Physical Exam   Patient Vitals for the past 24 hrs:  BP Temp Pulse Resp Height Weight  08/21/17 0556 - - - - 5' 0.5" (1.537 m) 255 lb (115.7 kg)  08/21/17 0555 137/78 - 85 17 - -  08/21/17 0514 (!) 151/86 97.9 F (36.6 C) 86 19 - -   Constitutional: Well-developed, well-nourished female in no acute distress.  Cardiovascular: normal rate and rhythm Respiratory: normal effort, no distress. GI: Abd soft, apppropriately-tender.  Nondistended MS: Extremities nontender, no edema, normal ROM    + Dorsalis pedis pulses bilaterally         Good skin tone, pink color    Normal strength bilaterally.   Neurologic: Alert and oriented x 4.   Grossly nonfocal. GU: Neg CVAT. Skin:  Warm and Dry Psych:  Affect appropriate.  PELVIC EXAM: deferred    Labs: No results found for this or any previous visit (from the past 24 hour(s)). --/--/AB POS, AB POS Performed at [facility],  [address] [place], [place] [address]  (02/22 1008)  Imaging:   MAU Course/MDM: I have ordered labs as follows: none Imaging ordered: none   Consult Dr Ihor Dow who recommends giving it time.   Discussed alternating rest and movement.  Take care not to fall.  Report worsening.   Treatments in MAU included none.   Pt stable at time of discharge.  Assessment: 1. Anterior thigh numbness   2. Pain of left lateral upper thigh     Plan: Discharge home  Recommend conservative care.   Follow up in clinic   Follow-up Information    [name], MD. Schedule an appointment as soon as possible for a visit.   Specialty:  Obstetrics and Gynecology Contact information: [address] [place] [address] [phone]           Encouraged to return here or to other Urgent Care/ED if she develops worsening of symptoms, increase in pain, fever, or other concerning symptoms.   Hansel Feinstein CNM, MSN Certified Nurse-Midwife 08/21/2017 5:57 AM

## 2017-08-27 ENCOUNTER — Encounter (HOSPITAL_COMMUNITY): Payer: BLUE CROSS/BLUE SHIELD

## 2017-08-27 ENCOUNTER — Telehealth: Payer: Self-pay | Admitting: Pediatrics

## 2017-08-27 ENCOUNTER — Inpatient Hospital Stay (HOSPITAL_COMMUNITY)
Admission: AD | Admit: 2017-08-27 | Discharge: 2017-08-27 | Discharge status: Against Medical Advice | Payer: BLUE CROSS/BLUE SHIELD | Source: Ambulatory Visit | Attending: Obstetrics & Gynecology | Admitting: Obstetrics & Gynecology

## 2017-08-27 ENCOUNTER — Encounter (HOSPITAL_COMMUNITY): Payer: Self-pay | Admitting: *Deleted

## 2017-08-27 DIAGNOSIS — Z90710 Acquired absence of both cervix and uterus: Secondary | ICD-10-CM | POA: Diagnosis not present

## 2017-08-27 DIAGNOSIS — E669 Obesity, unspecified: Secondary | ICD-10-CM | POA: Insufficient documentation

## 2017-08-27 DIAGNOSIS — Z79899 Other long term (current) drug therapy: Secondary | ICD-10-CM | POA: Insufficient documentation

## 2017-08-27 DIAGNOSIS — G2581 Restless legs syndrome: Secondary | ICD-10-CM | POA: Diagnosis not present

## 2017-08-27 DIAGNOSIS — M542 Cervicalgia: Secondary | ICD-10-CM | POA: Insufficient documentation

## 2017-08-27 DIAGNOSIS — I10 Essential (primary) hypertension: Secondary | ICD-10-CM | POA: Insufficient documentation

## 2017-08-27 DIAGNOSIS — M79652 Pain in left thigh: Secondary | ICD-10-CM | POA: Diagnosis not present

## 2017-08-27 DIAGNOSIS — M5136 Other intervertebral disc degeneration, lumbar region: Secondary | ICD-10-CM | POA: Insufficient documentation

## 2017-08-27 DIAGNOSIS — M79605 Pain in left leg: Secondary | ICD-10-CM

## 2017-08-27 DIAGNOSIS — G4730 Sleep apnea, unspecified: Secondary | ICD-10-CM | POA: Insufficient documentation

## 2017-08-27 DIAGNOSIS — Z87442 Personal history of urinary calculi: Secondary | ICD-10-CM | POA: Diagnosis not present

## 2017-08-27 DIAGNOSIS — F419 Anxiety disorder, unspecified: Secondary | ICD-10-CM | POA: Insufficient documentation

## 2017-08-27 DIAGNOSIS — F17210 Nicotine dependence, cigarettes, uncomplicated: Secondary | ICD-10-CM | POA: Diagnosis not present

## 2017-08-27 DIAGNOSIS — Z9889 Other specified postprocedural states: Secondary | ICD-10-CM | POA: Diagnosis not present

## 2017-08-27 NOTE — Progress Notes (Signed)
Pt left AMA, states she can not wait any longer.  States she will call MD and ask for outpatient appointment.

## 2017-08-27 NOTE — Progress Notes (Signed)
Vascular lab notified regarding pt leaving.

## 2017-08-27 NOTE — MAU Note (Signed)
Pt had Abd Hysterectomy on  08/18/2017. C/O pain in upper left leg. Was seen last week and was told it may be a pinched nerve. On Percocet without releif. Called office today and told to come in to make sure it is not a blood clot.
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
## 2017-08-27 NOTE — Telephone Encounter (Signed)
Pt called nurse line. She c/o deep-sharp/burning/stabbing thigh pain that seems to radiate from pelvis to knee.  She states she was seen in [facility] ED and told it was a pinched nerve, she voiced concern it is a blood clot.  She is s/p supracervical hyst abdominal 08/18/17.  She states the pain is worsening.  I advised since the pain is worsening, she should report to ED for evaluation. She voiced understanding and agreed with plan.

## 2017-08-27 NOTE — Progress Notes (Signed)
Message left with [facility] in vascular lab regarding pt's need for ultrasound to r/o DVT.

## 2017-08-28 ENCOUNTER — Emergency Department (EMERGENCY_DEPARTMENT_HOSPITAL)
Admit: 2017-08-28 | Discharge: 2017-08-28 | Disposition: A | Discharge status: Home / Self Care | Payer: BLUE CROSS/BLUE SHIELD | Attending: Emergency Medicine | Admitting: Emergency Medicine

## 2017-08-28 ENCOUNTER — Encounter (HOSPITAL_COMMUNITY): Payer: Self-pay

## 2017-08-28 ENCOUNTER — Emergency Department (HOSPITAL_COMMUNITY)
Admission: EM | Admit: 2017-08-28 | Discharge: 2017-08-28 | Disposition: A | Discharge status: Home / Self Care | Payer: BLUE CROSS/BLUE SHIELD | Attending: Emergency Medicine | Admitting: Emergency Medicine

## 2017-08-28 DIAGNOSIS — F17210 Nicotine dependence, cigarettes, uncomplicated: Secondary | ICD-10-CM | POA: Insufficient documentation

## 2017-08-28 DIAGNOSIS — I10 Essential (primary) hypertension: Secondary | ICD-10-CM | POA: Insufficient documentation

## 2017-08-28 DIAGNOSIS — M79605 Pain in left leg: Secondary | ICD-10-CM | POA: Diagnosis not present

## 2017-08-28 DIAGNOSIS — Z79899 Other long term (current) drug therapy: Secondary | ICD-10-CM | POA: Insufficient documentation

## 2017-08-28 NOTE — ED Notes (Signed)
Vascular Lab called and pt.s DVT  Study is negative.

## 2017-08-28 NOTE — ED Notes (Signed)
Patient verbalizes understanding of discharge instructions. Opportunity for questioning and answers were provided. Armband removed by staff, pt discharged from ED ambulatory.   

## 2017-08-28 NOTE — ED Provider Notes (Signed)
[facility] EMERGENCY DEPARTMENT Provider Note  CSN: [id number] Arrival date & time: 08/28/17 1129  Chief Complaint(s) Leg Pain  HPI Kaitlyn Whitney is a 52 y.o. female who presents to the emergency department with approximately 1 week of left thigh pain and numbness that began after a hysterectomy.  Patient has followed up with [facility] who believe it is either a pinched nerve or DVT.  She was sent here to rule out a DVT.  Pain is sporadic in nature but numbness persists.  No alleviating or aggravating factors.  Patient denies any other physical complaints including lower extremity weakness or numbness in other regions.  No bladder/bowel incontinence.  No leg swelling.  No prior history of DVTs or blood clots.  HPI  Past Medical History Past Medical History:  Diagnosis Date  . Anxiety   . Arthritis    hands  . History of kidney stones 2016  . Hypertension   . Obesity   . Restless leg syndrome   . Sleep apnea    c-pap   Patient Active Problem List   Diagnosis Date Noted  . Post-operative state 08/18/2017  . OSA on CPAP 07/22/2017  . Class 3 severe obesity with body mass index (BMI) of 45.0 to 49.9 in adult ([place]) 07/22/2017  . Status post bariatric surgery 07/22/2017  . Other insomnia 07/22/2017  . Visit for routine gyn exam 04/28/2017  . Ovarian cyst 03/09/2017  . Fibroid 03/09/2017  . History of laparoscopic adjustable gastric banding, APL.  10/23/2008. 09/02/2012  . S/P endometrial ablation 04/22/2012  . DIPLOPIA 05/22/2009  . MERALGIA PARESTHETICA 03/07/2008  . TOBACCO USE DISORDER/SMOKER-SMOKING CESSATION DISCUSSED 09/29/2007  . PRURITUS ANI 09/29/2007  . FECES, ABNORMAL 09/29/2007  . OBESITY, MORBID 07/01/2007  . DEPRESSION 07/01/2007  . OBSTRUCTIVE SLEEP APNEA 07/01/2007  . ELEVATED BP 07/01/2007   Home Medication(s) Prior to Admission medications   Medication Sig Start Date End Date Taking? Authorizing Provider  amLODipine (NORVASC) 10 MG tablet  Take 10 mg by mouth daily.     [provider]  diphenhydrAMINE (BENADRYL) 25 mg capsule Take 25 mg by mouth daily as needed for allergies.    [provider]  ibuprofen (ADVIL,MOTRIN) 800 MG tablet Take 1 tablet (800 mg total) by mouth every 8 (eight) hours. 08/20/17   [name], MD  oxyCODONE-acetaminophen (PERCOCET/ROXICET) 5-325 MG tablet Take 2 tablets by mouth every 4 (four) hours as needed for severe pain ((when tolerating fluids)). 08/20/17   [name], MD  varenicline (CHANTIX) 1 MG tablet Take 1 mg by mouth 2 (two) times daily.    [provider]  zolpidem (AMBIEN) 10 MG tablet TAKE 1 TABLET (10 MG TOTAL) BY MOUTH NIGHTLY AS NEEDED FOR UP TO 30 DAYS FOR SLEEP. 06/18/17   [provider]  Past Surgical History Past Surgical History:  Procedure Laterality Date  . ABDOMINAL HYSTERECTOMY    . BREAST SURGERY    . HYSTERECTOMY ABDOMINAL WITH SALPINGECTOMY Bilateral 08/18/2017   Procedure: SUPRACERVICAL HYSTERECTOMY ABDOMINAL;  Surgeon: [name], MD;  Location: [facility];  Service: Gynecology;  Laterality: Bilateral;  . LAPAROSCOPIC GASTRIC BANDING    . LYSIS OF ADHESION N/A 08/18/2017   Procedure: LYSIS OF ADHESION;  Surgeon: [name], MD;  Location: [facility];  Service: Gynecology;  Laterality: N/A;  . SALPINGOOPHORECTOMY Bilateral 08/18/2017   Procedure: SALPINGO OOPHORECTOMY;  Surgeon: [name], MD;  Location: [facility];  Service: Gynecology;  Laterality: Bilateral;  . TUBAL LIGATION  2005   Family History Family History  Problem Relation Age of Onset  . Asthma Mother   . Cancer Father   . Drug abuse Father   . Drug abuse Brother   . Diabetes Brother   . Drug abuse Brother   . Heart attack Brother   . Stroke Brother   . Asthma Other   . Hypertension Other   . Stroke Other     Social  History Social History   Tobacco Use  . Smoking status: Current Every Day Smoker    Packs/day: 0.10    Years: 30.00    Pack years: 3.00    Types: Cigarettes  . Smokeless tobacco: Never Used  . Tobacco comment: 10 cig per day  Substance Use Topics  . Alcohol use: No  . Drug use: No   Allergies Penicillins  Review of Systems Review of Systems All other systems are reviewed and are negative for acute change except as noted in the HPI  Physical Exam Vital Signs  I have reviewed the triage vital signs BP 130/90 (BP Location: Left Arm)   Pulse 87   Temp 98.6 F (37 C) (Oral)   Resp 18   LMP 12/20/2016   SpO2 99%   Physical Exam  Constitutional: She is oriented to person, place, and time. She appears well-developed and well-nourished. No distress.  Obese  HENT:  Head: Normocephalic and atraumatic.  Right Ear: External ear normal.  Left Ear: External ear normal.  Nose: Nose normal.  Eyes: Conjunctivae and EOM are normal. No scleral icterus.  Neck: Normal range of motion and phonation normal.  Cardiovascular: Normal rate and regular rhythm.  Pulmonary/Chest: Effort normal. No stridor. No respiratory distress.  Abdominal: She exhibits no distension.  Musculoskeletal: Normal range of motion. She exhibits no edema.       Legs: Neurological: She is alert and oriented to person, place, and time. She has normal strength.      Skin: She is not diaphoretic.  Psychiatric: She has a normal mood and affect. Her behavior is normal.  Vitals reviewed.   ED Results and Treatments Labs (all labs ordered are listed, but only abnormal results are displayed) Labs Reviewed - No data to display  EKG  EKG Interpretation  Date/Time:    Ventricular Rate:    PR Interval:    QRS Duration:   QT Interval:    QTC Calculation:   R Axis:     Text Interpretation:         Radiology No results found. Pertinent labs & imaging results that were available during my care of the patient were reviewed by me and considered in my medical decision making (see chart for details).  Medications Ordered in ED Medications - No data to display                                                                                                                                  Procedures Procedures  (including critical care time)  Medical Decision Making / ED Course I have reviewed the nursing notes for this encounter and the patient's prior records (if available in EHR or on provided paperwork).    Ultrasound DVT study was negative.  No evidence of cellulitis or other infectious process.  Patient denies any current back pain.  Likely neuropraxia versus impinged nerve.  Recommended close follow-up with PCP and GYN as needed.  The patient is safe for discharge with strict return precautions.   Final Clinical Impression(s) / ED Diagnoses Final diagnoses:  Left leg pain    Disposition: Discharge  Condition: Good  I have discussed the results, Dx and Tx plan with the patient who expressed understanding and agree(s) with the plan. Discharge instructions discussed at great length. The patient was given strict return precautions who verbalized understanding of the instructions. No further questions at time of discharge.    ED Discharge Orders    None       Follow Up: [facility], [place] [address] [phone]  Call  As needed  Gynecologist  Call  As needed     This chart was dictated using voice recognition software.  Despite best efforts to proofread,  errors can occur which can change the documentation meaning.   Fatima Blank, MD 08/28/17 [phone]

## 2017-08-28 NOTE — ED Triage Notes (Signed)
Pt arrives with complaints of left thigh pain since having hysterectomy. PT reports left thigh pain, swelling, and warmth to the area. She reports she was supposed to have dvt study yesterday but [place] was unable to preform.

## 2017-08-28 NOTE — ED Notes (Signed)
When attempting to assess pt she states "I have been here for 7 hours and I just need to know my results"

## 2017-08-28 NOTE — ED Notes (Signed)
ED Provider at bedside. 

## 2017-08-28 NOTE — Progress Notes (Signed)
Preliminary notes by tech-- Left lower extremity venous study  Completed.   Negative for deep vein and superficial vein thrombosis.  Result notified RN Cecille Rubin B by phone.   Hongying Corbet Hanley (RDMS RVT)  08/28/17 2:55 PM

## 2017-09-02 ENCOUNTER — Other Ambulatory Visit (HOSPITAL_COMMUNITY): Payer: Self-pay | Admitting: Chiropractic Medicine

## 2017-09-02 DIAGNOSIS — M62830 Muscle spasm of back: Secondary | ICD-10-CM

## 2017-09-02 DIAGNOSIS — M545 Low back pain: Secondary | ICD-10-CM

## 2017-09-02 DIAGNOSIS — M5416 Radiculopathy, lumbar region: Secondary | ICD-10-CM

## 2017-09-07 ENCOUNTER — Encounter (HOSPITAL_COMMUNITY): Payer: Self-pay

## 2017-09-07 ENCOUNTER — Ambulatory Visit (HOSPITAL_COMMUNITY): Payer: BLUE CROSS/BLUE SHIELD | Attending: Chiropractic Medicine

## 2017-09-13 ENCOUNTER — Ambulatory Visit (INDEPENDENT_AMBULATORY_CARE_PROVIDER_SITE_OTHER): Payer: BLUE CROSS/BLUE SHIELD | Admitting: Neurology

## 2017-09-13 DIAGNOSIS — G4733 Obstructive sleep apnea (adult) (pediatric): Secondary | ICD-10-CM | POA: Diagnosis not present

## 2017-09-13 DIAGNOSIS — Z6841 Body Mass Index (BMI) 40.0 and over, adult: Secondary | ICD-10-CM

## 2017-09-13 DIAGNOSIS — G4709 Other insomnia: Secondary | ICD-10-CM

## 2017-09-13 DIAGNOSIS — Z9989 Dependence on other enabling machines and devices: Principal | ICD-10-CM

## 2017-09-13 DIAGNOSIS — Z9884 Bariatric surgery status: Secondary | ICD-10-CM

## 2017-09-16 NOTE — Procedures (Signed)
PATIENT'S NAME:  Kaitlyn, Whitney DOB:      Apr 18, 1966      MR#:    [id number]     DATE OF RECORDING: 09/13/2017 REFERRING M.D.:   Study Performed:  Split-Night Titration Study HISTORY:  Transfer of care from Dr. Adline Peals [facility]  Her PCP is Dr. Arn Medal, MD. [name] is a 52 year old right-handed African-American female patient .She was diagnosed at the [facility] in the year 2008 or 2009 about a decade ago with OSA and begun CPAP therapy soon after.  She still snores according to her husband's observation while using her CPAP.  This was not the case years ago and she has used the same machine for the last 8 or 9 years. She preferred a FFM.  I was able to get the last compliance summary from 2017 the patient had used the machine 100% compliant with an average use at time of 6 hours and 50 minutes at night, the device was set at 10 cmH2O pressure was recently a ramp time of 20 minutes starting at 4 cm H20.  The patient endorsed the Epworth Sleepiness Scale at 6 points  The patient's weight 251 pounds with a height of 60 (inches), resulting in a BMI of 49.3 kg/m2.The patient's neck circumference measured 18 inches.  CURRENT MEDICATIONS: Norvasc, Xyzal, Chantix, Ambien   PROCEDURE:  This is a multichannel digital polysomnogram utilizing the SomnoStar 11.2 system.  Electrodes and sensors were applied and monitored per AASM Specifications.   EEG, EOG, Chin and Limb EMG, were sampled at 200 Hz.  ECG, Snore and Nasal Pressure, Thermal Airflow, Respiratory Effort, CPAP Flow and Pressure, Oximetry was sampled at 50 Hz. Digital video and audio were recorded.      BASELINE STUDY WITHOUT CPAP RESULTS:  Lights Out was at 22:20 and Lights On at 04:59.  Total recording time (TRT) was 130, with a total sleep time (TST) of 102.5 minutes.   The patient's sleep latency was 25.5 minutes.  REM latency was 0 minutes.  The sleep efficiency was 78.8 %.    SLEEP ARCHITECTURE: WASO (Wake  after sleep onset) was 17.5 minutes, Stage N1 was 19 minutes, Stage N2 was 70 minutes, Stage N3 was 13.5 minutes and Stage R (REM sleep) was 0 minutes.  The percentages were Stage N1 18.5%, Stage N2 68.3%, Stage N3 13.2% and Stage R (REM sleep) 0%.  RESPIRATORY ANALYSIS:  There were a total of 68 respiratory events:  1 obstructive apnea, 0 central apneas and 67 hypopneas with 1 respiratory event related arousal (RERAs).  Snoring was noted.    The total APNEA/HYPOPNEA INDEX (AHI) was 39.8 /hour and the total RESPIRATORY DISTURBANCE INDEX was 40.4 /hour.  0 events occurred in REM sleep and all 134 events in NREM. The patient spent 74 minutes sleep time in the supine position, and 259 minutes in non-supine. The supine AHI was 0.0 /hour versus a non-supine AHI of 39.8 /hour.  OXYGEN SATURATION & C02:  The wake baseline 02 saturation was 95%, with the lowest being 85%. Time spent below 89% saturation equaled 5 minutes.   PERIODIC LIMB MOVEMENTS AND AROUSALS:   The patient had a total of 0 Periodic Limb Movements. The arousals were noted as: 29 were spontaneous, 0 were associated with PLMs, and 16 were associated with respiratory events. Audio and video analysis did not show any abnormal or unusual movements, behaviors, phonations or vocalizations. The patient took one bathroom break. Snoring was noted. EKG was in  keeping with normal sinus rhythm (NSR).  TITRATION STUDY WITH CPAP RESULTS: CPAP was initiated at 5 cmH20 with heated humidity per AASM split night standards and pressure was advanced to 7/7 cmH20 because of hypopneas, apneas and desaturations. A dream wear FFM in small was used.  At a PAP pressure of 7 cmH20, there was a reduction of the AHI to 0.0 /hour.  Total recording time (TRT) was 269.5 minutes, with a total sleep time (TST) of 230 minutes. The patient's sleep latency was 6.5 minutes. REM latency was 61 minutes.  The sleep efficiency was 85.3 %.    SLEEP ARCHITECTURE: Wake after sleep was 33  minutes, Stage N1 16 minutes, Stage N2 101.5 minutes, Stage N3 43.5 minutes and Stage R (REM sleep) 69 minutes. The percentages were: Stage N1 7.%, Stage N2 44.1%, Stage N3 18.9% and Stage R (REM sleep) 30.%. The sleep architecture was notable for REM rebound. The arousals were noted as: 16 were spontaneous, 0 were associated with PLMs, and 0 were associated with respiratory events.  RESPIRATORY ANALYSIS:  There were a total of 1 respiratory event: 1 hypopnea with 0 respiratory event related arousals (RERAs).    The total APNEA/HYPOPNEA INDEX (AHI) was 0.3 /hour and the total RESPIRATORY DISTURBANCE INDEX was 0.3 /hour.  1 event in NREM. The patient spent 32% of total sleep time in the supine position. The supine AHI was 0.0 /hour, versus a non-supine AHI of 0.4/hour.  OXYGEN SATURATION & C02:  The wake baseline 02 saturation was 95%, with the lowest being 89%. Time spent below 89% saturation equaled 0 minutes. The patient had a total of 0 Periodic Limb Movements.  POLYSOMNOGRAPHY IMPRESSION :   1. Obstructive Sleep Apnea (OSA) at AHI of 39.8, without hypoxemia but with loud snoring and some additional Respiratory arousals.  RECOMMENDATIONS: Start CPAP with Respironics FFM dream wear model and CPAP ( auto titration capable ) to be set between 5 and 10 cm water.   1. A follow up appointment will be scheduled in the Sleep Clinic at [facility].    I certify that I have reviewed the entire raw data recording prior to the issuance of this report in accordance with the Standards of Accreditation of the American Academy of Sleep Medicine (AASM)  Larey Seat, M.D.     09-16-2017  Diplomat, American Board of Psychiatry and Neurology  Diplomat, [place] [employer] [facility] Medical Director, [place] [facility]

## 2017-09-16 NOTE — Addendum Note (Signed)
Addended by: Larey Seat on: 09/16/2017 06:09 PM   Modules accepted: Orders

## 2017-09-17 ENCOUNTER — Telehealth: Payer: Self-pay | Admitting: Neurology

## 2017-09-17 ENCOUNTER — Encounter: Payer: Self-pay | Admitting: Neurology

## 2017-09-17 NOTE — Telephone Encounter (Signed)
I called pt. I advised pt that Dr. Brett Fairy reviewed their sleep study results and found that pt has sleep apnea. Dr. Brett Fairy recommends that pt start a auto CPAP. I reviewed PAP compliance expectations with the pt. Pt is agreeable to starting a CPAP. I advised pt that an order will be sent to a DME, [facility], and [facility] will call the pt within about one week after they file with the pt's insurance. [facility] will show the pt how to use the machine, fit for masks, and troubleshoot the CPAP if needed. A follow up appt was made for insurance purposes with Cecille Rubin, NP on December 15, 2017 at 10:45 am . Pt verbalized understanding to arrive 15 minutes early and bring their CPAP. A letter with all of this information in it will be mailed to the pt as a reminder. I verified with the pt that the address we have on file is correct. Pt verbalized understanding of results. Pt had no questions at this time but was encouraged to call back if questions arise.

## 2017-09-17 NOTE — Telephone Encounter (Signed)
-----   Message from Larey Seat, MD sent at 09/16/2017  6:09 PM EDT ----- POLYSOMNOGRAPHY IMPRESSION :   1. Obstructive Sleep Apnea (OSA) at AHI of 39.8, without  hypoxemia but with loud snoring and some additional Respiratory  arousals. RECOMMENDATIONS: Start CPAP with Respironics FFM dream wear model  and CPAP ( auto titration capable ) to be set between 5 and 10 cm  water.   1. A follow up appointment will be scheduled in the Sleep Clinic  at [facility].   Cc Dr Luciana Axe, please.

## 2017-09-18 ENCOUNTER — Encounter (HOSPITAL_COMMUNITY): Payer: Self-pay

## 2017-09-18 ENCOUNTER — Ambulatory Visit (HOSPITAL_COMMUNITY)
Admission: RE | Admit: 2017-09-18 | Discharge: 2017-09-18 | Disposition: A | Discharge status: Home / Self Care | Payer: BLUE CROSS/BLUE SHIELD | Source: Ambulatory Visit | Attending: Chiropractic Medicine | Admitting: Chiropractic Medicine

## 2017-09-18 DIAGNOSIS — M4807 Spinal stenosis, lumbosacral region: Secondary | ICD-10-CM | POA: Diagnosis not present

## 2017-09-18 DIAGNOSIS — M48061 Spinal stenosis, lumbar region without neurogenic claudication: Secondary | ICD-10-CM | POA: Diagnosis not present

## 2017-09-18 DIAGNOSIS — M8938 Hypertrophy of bone, other site: Secondary | ICD-10-CM | POA: Insufficient documentation

## 2017-09-18 DIAGNOSIS — M47896 Other spondylosis, lumbar region: Secondary | ICD-10-CM | POA: Insufficient documentation

## 2017-09-18 DIAGNOSIS — M545 Low back pain: Secondary | ICD-10-CM | POA: Diagnosis not present

## 2017-09-18 DIAGNOSIS — M62830 Muscle spasm of back: Secondary | ICD-10-CM | POA: Diagnosis not present

## 2017-09-18 DIAGNOSIS — M5416 Radiculopathy, lumbar region: Secondary | ICD-10-CM | POA: Insufficient documentation

## 2017-09-18 DIAGNOSIS — M2578 Osteophyte, vertebrae: Secondary | ICD-10-CM | POA: Diagnosis not present

## 2017-09-18 DIAGNOSIS — M1288 Other specific arthropathies, not elsewhere classified, other specified site: Secondary | ICD-10-CM | POA: Insufficient documentation

## 2017-09-23 ENCOUNTER — Encounter: Payer: Self-pay | Admitting: Obstetrics and Gynecology

## 2017-09-23 ENCOUNTER — Ambulatory Visit (INDEPENDENT_AMBULATORY_CARE_PROVIDER_SITE_OTHER): Payer: BLUE CROSS/BLUE SHIELD | Admitting: Obstetrics and Gynecology

## 2017-09-23 VITALS — BP 129/87 | HR 84 | Ht 65.0 in | Wt 248.1 lb

## 2017-09-23 DIAGNOSIS — G629 Polyneuropathy, unspecified: Secondary | ICD-10-CM

## 2017-09-23 DIAGNOSIS — Z9889 Other specified postprocedural states: Secondary | ICD-10-CM

## 2017-09-23 MED ORDER — GABAPENTIN 300 MG PO CAPS: 300.0000 mg | ORAL_CAPSULE | Freq: Three times a day (TID) | ORAL | 1 refills | Status: DC

## 2017-09-23 NOTE — Patient Instructions (Signed)
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
## 2017-09-23 NOTE — Progress Notes (Signed)
Patient is in the office for follow up, hysterectomy on 08-18-17. Patient complains of "lightening", hot sensations in left leg since surgery. Pt states that she went to see chiropractor and had MRI on Friday. Pt states that leg is tender to touch and sometimes has a stabbing sensation.

## 2017-09-28 ENCOUNTER — Telehealth: Payer: Self-pay | Admitting: Neurology

## 2017-09-28 NOTE — Telephone Encounter (Signed)
Called the patient to see why she was needing an apt. No answer, LVM for the pt to call back. It appears we have her scheduled for a apt for cpap follow up in July. Please ask what the patient is wanting to be seen for. If this isn't a new problem we can offer apt with Cecille Rubin, NP. Dr Brett Fairy has only seen her for sleep. If this is a new problem and the patient is calling regarding her MRI results the patient would need to discuss with ordering MD of the MRI to see if neurology should follow and if so they would need to put a new referral in for that since Dr Brett Fairy only has saw her for sleep.   LVM for the patient to call back.

## 2017-09-28 NOTE — Telephone Encounter (Signed)
-----   Message from [place], Generic sent at 09/25/2017 3:59 PM EDT -----    Appointment Request From: [name]    With Provider: Larey Seat, MD [[facility]]    Preferred Date Range: 09/28/2017 - 10/02/2017    Preferred Times: Any time    Reason for visit: Request an Appointment    Comments:  Please see MRI in chart

## 2017-09-29 ENCOUNTER — Telehealth: Payer: Self-pay

## 2017-09-29 ENCOUNTER — Other Ambulatory Visit: Payer: Self-pay

## 2017-09-29 DIAGNOSIS — G629 Polyneuropathy, unspecified: Secondary | ICD-10-CM

## 2017-09-29 NOTE — Telephone Encounter (Signed)
I placed the referral this am to neuro.Kaitlyn Whitney is scheduling pt for appt

## 2017-09-29 NOTE — Telephone Encounter (Signed)
Pt was referred to her neurologist. Has she called and made an appt? Thanks Legrand Como

## 2017-09-29 NOTE — Telephone Encounter (Signed)
Pt states that she is in really bad pain since her hysterectomy. She states that her pain is 8-9/10. She states that the gabapentin is not working for her. She states that she has been to the ER 4 times, and she does not want to go because nothing is being done.

## 2017-11-03 ENCOUNTER — Encounter: Payer: Self-pay | Admitting: Gastroenterology

## 2017-11-03 ENCOUNTER — Other Ambulatory Visit: Payer: Self-pay | Admitting: Obstetrics and Gynecology

## 2017-11-04 ENCOUNTER — Other Ambulatory Visit: Payer: Self-pay

## 2017-11-04 MED ORDER — OXYCODONE-ACETAMINOPHEN 5-325 MG PO TABS: 2.0000 | ORAL_TABLET | ORAL | 0 refills | Status: DC | PRN

## 2017-11-04 NOTE — Telephone Encounter (Signed)
OxyCODONE refill approved for 20 tablets only, no Refills.. Patient needs to pick up RX tomorrow when Dr. Rip Harbour is in office to sign it.

## 2017-11-04 NOTE — Progress Notes (Signed)
Patient requested refill of 30 OxyCODONE Acetaminophen.  Dr. Johnette Abraham. Advised to reduce to 20 tablets NO Refills.  RX sent to pharmacy.

## 2017-11-04 NOTE — Telephone Encounter (Signed)
Message sent to Dr. Johnette Abraham

## 2017-11-04 NOTE — Progress Notes (Signed)
Patient needs to pick up RX from our Offices tomorrow.

## 2017-11-28 ENCOUNTER — Encounter: Payer: Self-pay | Admitting: Neurology

## 2017-11-30 ENCOUNTER — Encounter

## 2017-11-30 ENCOUNTER — Ambulatory Visit: Payer: BLUE CROSS/BLUE SHIELD | Admitting: Neurology

## 2017-11-30 ENCOUNTER — Encounter: Payer: Self-pay | Admitting: Neurology

## 2017-11-30 VITALS — BP 138/90 | HR 91 | Ht 60.0 in | Wt 243.0 lb

## 2017-11-30 DIAGNOSIS — G5722 Lesion of femoral nerve, left lower limb: Secondary | ICD-10-CM

## 2017-11-30 DIAGNOSIS — M792 Neuralgia and neuritis, unspecified: Secondary | ICD-10-CM | POA: Diagnosis not present

## 2017-11-30 DIAGNOSIS — G629 Polyneuropathy, unspecified: Secondary | ICD-10-CM

## 2017-11-30 MED ORDER — GABAPENTIN 300 MG PO CAPS: 300.0000 mg | ORAL_CAPSULE | Freq: Two times a day (BID) | ORAL | 1 refills | Status: DC

## 2017-11-30 NOTE — Patient Instructions (Signed)
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
## 2017-12-09 DIAGNOSIS — G4733 Obstructive sleep apnea (adult) (pediatric): Secondary | ICD-10-CM | POA: Diagnosis not present

## 2017-12-15 ENCOUNTER — Ambulatory Visit: Payer: Self-pay | Admitting: Nurse Practitioner

## 2018-01-08 DIAGNOSIS — G4733 Obstructive sleep apnea (adult) (pediatric): Secondary | ICD-10-CM | POA: Diagnosis not present

## 2018-01-18 ENCOUNTER — Encounter: Payer: Self-pay | Admitting: Neurology

## 2018-01-18 ENCOUNTER — Ambulatory Visit: Payer: Self-pay | Admitting: Neurology

## 2018-01-18 ENCOUNTER — Ambulatory Visit (INDEPENDENT_AMBULATORY_CARE_PROVIDER_SITE_OTHER): Payer: BLUE CROSS/BLUE SHIELD | Admitting: Neurology

## 2018-01-18 DIAGNOSIS — G5722 Lesion of femoral nerve, left lower limb: Secondary | ICD-10-CM | POA: Diagnosis not present

## 2018-01-18 DIAGNOSIS — G5712 Meralgia paresthetica, left lower limb: Secondary | ICD-10-CM

## 2018-01-18 NOTE — Progress Notes (Signed)
[facility]    Nerve / Sites Muscle Latency Ref. Amplitude Ref. Rel Amp Segments Distance Velocity Ref. Area    ms ms mV mV %  cm m/s m/s mVms  R Peroneal - EDB     Ankle EDB 4.1 ?6.5 8.7 ?2.0 100 Ankle - EDB 9   26.2     Fib head EDB 9.1  8.1  93 Fib head - Ankle 27 54 ?44 25.3     Pop fossa EDB 10.9  7.8  96.2 Pop fossa - Fib head 10 56 ?44 24.5         Pop fossa - Ankle      L Peroneal - EDB     Ankle EDB 4.2 ?6.5 6.7 ?2.0 100 Ankle - EDB 9   22.5     Fib head EDB 9.6  5.8  87.3 Fib head - Ankle 27 50 ?44 20.2     Pop fossa EDB 11.5  5.9  102 Pop fossa - Fib head 10 53 ?44 23.5         Pop fossa - Ankle      R Tibial - AH     Ankle AH 4.3 ?5.8 16.0 ?4.0 100 Ankle - AH 9   33.3     Pop fossa AH 11.5  11.0  68.8 Pop fossa - Ankle 30 42 ?41 25.8  L Tibial - AH     Ankle AH 4.7 ?5.8 21.4 ?4.0 100 Ankle - AH 9   41.5     Pop fossa AH 11.7  18.3  85.3 Pop fossa - Ankle 30 43 ?41 41.5  R Femoral - Vastus Med     B. Ing Lig Vastus Med 5.5  5.1  100 B. Ing Lig - Vastus Med    36.7  L Femoral - Vastus Med     B. Ing Lig Vastus Med 6.2  4.7  100 B. Ing Lig - Vastus Med    29.3                    SNC    Nerve / Sites Rec. Site Peak Lat Ref.  Amp Ref. Segments Distance    ms ms V V  cm  R Sural - Ankle (Calf)     Calf Ankle 4.0 ?4.4 8 ?6 Calf - Ankle 14  L Sural - Ankle (Calf)     Calf Ankle 3.9 ?4.4 9 ?6 Calf - Ankle 14  R Superficial peroneal - Ankle     Lat leg Ankle 4.0 ?4.4 6 ?6 Lat leg - Ankle 14  L Superficial peroneal - Ankle     Lat leg Ankle 3.8 ?4.4 6 ?6 Lat leg - Ankle 14  L Lateral femoral cutaneous - Thigh (Inguinal ligament)     A. Ing ligament Thigh NR  NR  A. Ing ligament - Thigh   R Lateral femoral cutaneous - Thigh (Inguinal ligament)     A. Ing ligament Thigh 5.9  4  A. Ing ligament - Thigh                  F  Wave    Nerve F Lat Ref.   ms ms  R Tibial - AH 45.8 ?56.0  L Tibial - AH 45.9 ?56.0

## 2018-01-18 NOTE — Procedures (Signed)
     HISTORY:  Kaitlyn Whitney is a 52 year old patient with a history of an abdominal hysterectomy that was done in March 2019.  Following the surgery she began to have severe pain in the anterolateral aspect of her left thigh.  She denied any back pain.  She however underwent lumbosacral spine surgery in April 2019 without improvement of the left thigh pain.  The patient is being evaluated for possible neuropathy or lumbosacral radiculopathy.  NERVE CONDUCTION STUDIES:  Nerve conduction studies were performed on both lower extremities.  The distal motor latencies and motor amplitudes for the peroneal and posterior tibial nerves are within normal limits bilaterally.  The nerve conduction velocities for these nerves were normal bilaterally.  The latencies for the femoral nerves appear to be relatively slightly prolonged on the left as compared to the right with normal motor amplitudes for these nerves bilaterally.  The sensory latencies for the sural and peroneal nerves were within normal limits bilaterally, but there was absence of the left lateral femoral cutaneous nerve, the latency was present on the right.  The F-wave latencies for the posterior tibial nerves were normal bilaterally.  EMG STUDIES:  EMG study was performed on the left lower extremity:  The tibialis anterior muscle reveals 2 to 4K motor units with full recruitment. No fibrillations or positive waves were seen. The peroneus tertius muscle reveals 2 to 4K motor units with full recruitment. No fibrillations or positive waves were seen. The medial gastrocnemius muscle reveals 1 to 3K motor units with full recruitment. No fibrillations or positive waves were seen. The vastus lateralis muscle reveals 2 to 4K motor units with full recruitment. No fibrillations or positive waves were seen. The iliopsoas muscle reveals 2 to 4K motor units with full recruitment. No fibrillations or positive waves were seen. The biceps femoris muscle (long  head) reveals 2 to 4K motor units with full recruitment. No fibrillations or positive waves were seen. The lumbosacral paraspinal muscles were tested at 3 levels, and revealed no abnormalities of insertional activity at all 3 levels tested. There was good relaxation.   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  Sensory abnormalities reported by the patient in the left leg correlate well with this finding.  EMG evaluation of the left lower extremity is within normal limits, no evidence of an overlying lumbosacral radiculopathy is seen.  Jill Alexanders MD 01/18/2018 4:23 PM  [facility] [address] [place], [place] [address]  Phone [phone] Fax [phone]

## 2018-01-18 NOTE — Progress Notes (Signed)
Please refer to EMG and nerve conduction study procedure note. 

## 2018-01-21 ENCOUNTER — Encounter: Payer: Self-pay | Admitting: Neurology

## 2018-01-26 NOTE — Telephone Encounter (Signed)
We can offer Lyrica or Cymbalta, but I do not have much experience with these medications.  We could increase gabapentin to 900 mg at night, and 300 mg in AM - to avoid daytime drowsiness. Your current prescription covers this dose change.  Your PCP will usually treat pain or refer to pain specialist if these don't give relief.

## 2018-02-08 DIAGNOSIS — G4733 Obstructive sleep apnea (adult) (pediatric): Secondary | ICD-10-CM | POA: Diagnosis not present

## 2018-02-22 DIAGNOSIS — G4733 Obstructive sleep apnea (adult) (pediatric): Secondary | ICD-10-CM | POA: Diagnosis not present

## 2018-03-11 DIAGNOSIS — G4733 Obstructive sleep apnea (adult) (pediatric): Secondary | ICD-10-CM | POA: Diagnosis not present

## 2018-03-16 ENCOUNTER — Other Ambulatory Visit: Payer: Self-pay

## 2018-03-17 NOTE — Telephone Encounter (Signed)
Please review for refill.  

## 2018-03-22 ENCOUNTER — Encounter: Payer: Self-pay | Admitting: Neurology

## 2018-03-22 NOTE — Telephone Encounter (Signed)
Pt will need to see PCP or neurologist for her pain medication. Thanks Legrand Como

## 2018-03-23 DIAGNOSIS — Z1211 Encounter for screening for malignant neoplasm of colon: Secondary | ICD-10-CM | POA: Diagnosis not present

## 2018-03-23 DIAGNOSIS — F5101 Primary insomnia: Secondary | ICD-10-CM | POA: Diagnosis not present

## 2018-03-23 DIAGNOSIS — I10 Essential (primary) hypertension: Secondary | ICD-10-CM | POA: Diagnosis not present

## 2018-03-23 DIAGNOSIS — Z1239 Encounter for other screening for malignant neoplasm of breast: Secondary | ICD-10-CM | POA: Diagnosis not present

## 2018-03-24 ENCOUNTER — Encounter: Payer: Self-pay | Admitting: Neurology

## 2018-03-24 ENCOUNTER — Ambulatory Visit: Payer: BLUE CROSS/BLUE SHIELD | Admitting: Neurology

## 2018-03-24 VITALS — BP 153/106 | HR 87 | Ht 60.0 in | Wt 246.0 lb

## 2018-03-24 DIAGNOSIS — Z9989 Dependence on other enabling machines and devices: Secondary | ICD-10-CM | POA: Diagnosis not present

## 2018-03-24 DIAGNOSIS — G5712 Meralgia paresthetica, left lower limb: Secondary | ICD-10-CM

## 2018-03-24 DIAGNOSIS — Z6841 Body Mass Index (BMI) 40.0 and over, adult: Secondary | ICD-10-CM

## 2018-03-24 DIAGNOSIS — G4733 Obstructive sleep apnea (adult) (pediatric): Secondary | ICD-10-CM | POA: Diagnosis not present

## 2018-03-24 MED ORDER — PREGABALIN 25 MG PO CAPS: 25.0000 mg | ORAL_CAPSULE | Freq: Two times a day (BID) | ORAL | 0 refills | Status: DC
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
## 2018-03-24 NOTE — Patient Instructions (Signed)
Pregabalin capsules What is this medicine? PREGABALIN (pre GAB a lin) is used to treat nerve pain from diabetes, shingles, spinal cord injury, and fibromyalgia. It is also used to control seizures in epilepsy. This medicine may be used for other purposes; ask your health care provider or pharmacist if you have questions. COMMON BRAND NAME(S): Lyrica What should I tell my health care provider before I take this medicine? They need to know if you have any of these conditions: -bleeding problems -heart disease, including heart failure -history of alcohol or drug abuse -kidney disease -suicidal thoughts, plans, or attempt; a previous suicide attempt by you or a family member -an unusual or allergic reaction to pregabalin, gabapentin, other medicines, foods, dyes, or preservatives -pregnant or trying to get pregnant or trying to conceive with your partner -breast-feeding How should I use this medicine? Take this medicine by mouth with a glass of water. Follow the directions on the prescription label. You can take this medicine with or without food. Take your doses at regular intervals. Do not take your medicine more often than directed. Do not stop taking except on your doctor's advice. A special MedGuide will be given to you by the pharmacist with each prescription and refill. Be sure to read this information carefully each time. Talk to your pediatrician regarding the use of this medicine in children. Special care may be needed. Overdosage: If you think you have taken too much of this medicine contact a poison control center or emergency room at once. NOTE: This medicine is only for you. Do not share this medicine with others. What if I miss a dose? If you miss a dose, take it as soon as you can. If it is almost time for your next dose, take only that dose. Do not take double or extra doses. What may interact with this medicine? -alcohol -certain medicines for blood pressure like captopril,  enalapril, or lisinopril -certain medicines for diabetes, like pioglitazone or rosiglitazone -certain medicines for anxiety or sleep -narcotic medicines for pain This list may not describe all possible interactions. Give your health care provider a list of all the medicines, herbs, non-prescription drugs, or dietary supplements you use. Also tell them if you smoke, drink alcohol, or use illegal drugs. Some items may interact with your medicine. What should I watch for while using this medicine? Tell your doctor or healthcare professional if your symptoms do not start to get better or if they get worse. Visit your doctor or health care professional for regular checks on your progress. Do not stop taking except on your doctor's advice. You may develop a severe reaction. Your doctor will tell you how much medicine to take. Wear a medical identification bracelet or chain if you are taking this medicine for seizures, and carry a card that describes your disease and details of your medicine and dosage times. You may get drowsy or dizzy. Do not drive, use machinery, or do anything that needs mental alertness until you know how this medicine affects you. Do not stand or sit up quickly, especially if you are an older patient. This reduces the risk of dizzy or fainting spells. Alcohol may interfere with the effect of this medicine. Avoid alcoholic drinks. If you have a heart condition, like congestive heart failure, and notice that you are retaining water and have swelling in your hands or feet, contact your health care provider immediately. The use of this medicine may increase the chance of suicidal thoughts or actions. Pay special attention   to how you are responding while on this medicine. Any worsening of mood, or thoughts of suicide or dying should be reported to your health care professional right away. This medicine has caused reduced sperm counts in some men. This may interfere with the ability to father a  child. You should talk to your doctor or health care professional if you are concerned about your fertility. Women who become pregnant while using this medicine for seizures may enroll in the [place] Antiepileptic Drug Pregnancy Registry by calling 1-888-233-2334. This registry collects information about the safety of antiepileptic drug use during pregnancy. What side effects may I notice from receiving this medicine? Side effects that you should report to your doctor or health care professional as soon as possible: -allergic reactions like skin rash, itching or hives, swelling of the face, lips, or tongue -breathing problems -changes in vision -chest pain -confusion -jerking or unusual movements of any part of your body -loss of memory -muscle pain, tenderness, or weakness -suicidal thoughts or other mood changes -swelling of the ankles, feet, hands -unusual bruising or bleeding Side effects that usually do not require medical attention (report to your doctor or health care professional if they continue or are bothersome): -dizziness -drowsiness -dry mouth -headache -nausea -tremors -trouble sleeping -weight gain This list may not describe all possible side effects. Call your doctor for medical advice about side effects. You may report side effects to FDA at 1-800-FDA-1088. Where should I keep my medicine? Keep out of the reach of children. This medicine can be abused. Keep your medicine in a safe place to protect it from theft. Do not share this medicine with anyone. Selling or giving away this medicine is dangerous and against the law. This medicine may cause accidental overdose and death if it taken by other adults, children, or pets. Mix any unused medicine with a substance like cat litter or coffee grounds. Then throw the medicine away in a sealed container like a sealed bag or a coffee can with a lid. Do not use the medicine after the expiration date. Store at room  temperature between 15 and 30 degrees C (59 and 86 degrees F). NOTE: This sheet is a summary. It may not cover all possible information. If you have questions about this medicine, talk to your doctor, pharmacist, or health care provider.  2018 Elsevier/Gold Standard (2015-07-05 10:26:12)  

## 2018-03-25 ENCOUNTER — Telehealth: Payer: Self-pay | Admitting: Neurology

## 2018-03-25 NOTE — Telephone Encounter (Signed)
Sent to Dr. Maryjean Ka . Dr. Maryjean Ka is booking to Dec. I called Patient and she is aware.

## 2018-03-26 ENCOUNTER — Other Ambulatory Visit: Payer: Self-pay | Admitting: Family Medicine

## 2018-03-26 DIAGNOSIS — Z1231 Encounter for screening mammogram for malignant neoplasm of breast: Secondary | ICD-10-CM

## 2018-04-01 ENCOUNTER — Ambulatory Visit: Payer: BLUE CROSS/BLUE SHIELD | Admitting: Neurology

## 2018-04-10 DIAGNOSIS — G4733 Obstructive sleep apnea (adult) (pediatric): Secondary | ICD-10-CM | POA: Diagnosis not present

## 2018-04-30 ENCOUNTER — Ambulatory Visit: Payer: BLUE CROSS/BLUE SHIELD

## 2018-05-11 DIAGNOSIS — G4733 Obstructive sleep apnea (adult) (pediatric): Secondary | ICD-10-CM | POA: Diagnosis not present

## 2018-06-02 DIAGNOSIS — G4733 Obstructive sleep apnea (adult) (pediatric): Secondary | ICD-10-CM | POA: Diagnosis not present

## 2018-06-03 ENCOUNTER — Encounter (HOSPITAL_COMMUNITY): Payer: Self-pay

## 2018-06-10 DIAGNOSIS — G4733 Obstructive sleep apnea (adult) (pediatric): Secondary | ICD-10-CM | POA: Diagnosis not present

## 2018-06-17 ENCOUNTER — Ambulatory Visit: Payer: BLUE CROSS/BLUE SHIELD

## 2018-07-08 ENCOUNTER — Encounter: Payer: Self-pay | Admitting: Family Medicine

## 2018-07-11 DIAGNOSIS — G4733 Obstructive sleep apnea (adult) (pediatric): Secondary | ICD-10-CM | POA: Diagnosis not present

## 2018-10-22 DIAGNOSIS — G4733 Obstructive sleep apnea (adult) (pediatric): Secondary | ICD-10-CM | POA: Diagnosis not present

## 2019-03-28 ENCOUNTER — Other Ambulatory Visit: Payer: Self-pay

## 2019-03-28 ENCOUNTER — Encounter: Payer: Self-pay | Admitting: Adult Health

## 2019-03-28 ENCOUNTER — Ambulatory Visit: Payer: BC Managed Care – PPO | Admitting: Adult Health

## 2019-03-28 VITALS — BP 150/91 | HR 80 | Temp 97.4°F | Ht 60.0 in | Wt 249.6 lb

## 2019-03-28 DIAGNOSIS — G5712 Meralgia paresthetica, left lower limb: Secondary | ICD-10-CM

## 2019-03-28 DIAGNOSIS — Z9989 Dependence on other enabling machines and devices: Secondary | ICD-10-CM

## 2019-03-28 DIAGNOSIS — G4733 Obstructive sleep apnea (adult) (pediatric): Secondary | ICD-10-CM

## 2019-03-28 MED ORDER — PREGABALIN 25 MG PO CAPS: 25.0000 mg | ORAL_CAPSULE | Freq: Two times a day (BID) | ORAL | 0 refills | Status: DC

## 2019-03-28 NOTE — Patient Instructions (Addendum)
Your Plan:  Continue CPAP  Start Lyrica 25 mg at bedtime for 1 week then increase to 1 tablet twice a day  Thank you for coming to see [place] at [facility]. I hope we have been able to provide you high quality care today.  You may receive a patient satisfaction survey over the next few weeks. We would appreciate your feedback and comments so that we may continue to improve ourselves and the health of our patients.  Pregabalin capsules What is this medicine? PREGABALIN (pre GAB a lin) is used to treat nerve pain from diabetes, shingles, spinal cord injury, and fibromyalgia. It is also used to control seizures in epilepsy. This medicine may be used for other purposes; ask your health care provider or pharmacist if you have questions. COMMON BRAND NAME(S): Lyrica What should I tell my health care provider before I take this medicine? They need to know if you have any of these conditions:  heart disease  history of drug abuse or alcohol abuse problem  kidney disease  lung or breathing disease  suicidal thoughts, plans, or attempt; a previous suicide attempt by you or a family member  an unusual or allergic reaction to pregabalin, gabapentin, other medicines, foods, dyes, or preservatives  pregnant or trying to get pregnant  breast-feeding How should I use this medicine? Take this medicine by mouth with a glass of water. Follow the directions on the prescription label. You can take it with or without food. If it upsets your stomach, take it with food. Take your medicine at regular intervals. Do not take it more often than directed. Do not stop taking except on your doctor's advice. A special MedGuide will be given to you by the pharmacist with each prescription and refill. Be sure to read this information carefully each time. Talk to your pediatrician regarding the use of this medicine in children. While this drug may be prescribed for children as young as 1 month for  selected conditions, precautions do apply. Overdosage: If you think you have taken too much of this medicine contact a poison control center or emergency room at once. NOTE: This medicine is only for you. Do not share this medicine with others. What if I miss a dose? If you miss a dose, take it as soon as you can. If it is almost time for your next dose, take only that dose. Do not take double or extra doses. What may interact with this medicine? This medicine may interact with the following medications:  alcohol  antihistamines for allergy, cough, and cold  certain medicines for anxiety or sleep  certain medicines for depression like amitriptyline, fluoxetine, sertraline  certain medicines for diabetes  certain medicines for seizures like phenobarbital, primidone  general anesthetics like halothane, isoflurane, methoxyflurane, propofol  local anesthetics like lidocaine, pramoxine, tetracaine  medicines that relax muscles for surgery  narcotic medicines for pain  phenothiazines like chlorpromazine, mesoridazine, prochlorperazine, thioridazine This list may not describe all possible interactions. Give your health care provider a list of all the medicines, herbs, non-prescription drugs, or dietary supplements you use. Also tell them if you smoke, drink alcohol, or use illegal drugs. Some items may interact with your medicine. What should I watch for while using this medicine? Tell your doctor or healthcare professional if your symptoms do not start to get better or if they get worse. Visit your doctor or health care professional for regular checks on your progress. Do not stop taking except on your doctor's  advice. You may develop a severe reaction. Your doctor will tell you how much medicine to take. Wear a medical identification bracelet or chain if you are taking this medicine for seizures, and carry a card that describes your disease and details of your medicine and dosage  times. You may get drowsy or dizzy. Do not drive, use machinery, or do anything that needs mental alertness until you know how this medicine affects you. Do not stand or sit up quickly, especially if you are an older patient. This reduces the risk of dizzy or fainting spells. Alcohol may interfere with the effect of this medicine. Avoid alcoholic drinks. If you have a heart condition, like congestive heart failure, and notice that you are retaining water and have swelling in your hands or feet, contact your health care provider immediately. The use of this medicine may increase the chance of suicidal thoughts or actions. Pay special attention to how you are responding while on this medicine. Any worsening of mood, or thoughts of suicide or dying should be reported to your health care professional right away. This medicine has caused reduced sperm counts in some men. This may interfere with the ability to father a child. You should talk to your doctor or health care professional if you are concerned about your fertility. Women who become pregnant while using this medicine for seizures may enroll in the [place] Pregnancy Registry by calling [phone]. This registry collects information about the safety of antiepileptic drug use during pregnancy. What side effects may I notice from receiving this medicine? Side effects that you should report to your doctor or health care professional as soon as possible:  allergic reactions like skin rash, itching or hives, swelling of the face, lips, or tongue  breathing problems  changes in vision  chest pain  confusion  jerking or unusual movements of any part of your body  loss of memory  muscle pain, tenderness, or weakness  suicidal thoughts or other mood changes  swelling of the ankles, feet, hands  unusual bruising or bleeding Side effects that usually do not require medical attention (report to your doctor or health  care professional if they continue or are bothersome):  dizziness  drowsiness  dry mouth  headache  nausea  tremors  trouble sleeping  weight gain This list may not describe all possible side effects. Call your doctor for medical advice about side effects. You may report side effects to FDA at 1-800-FDA-1088. Where should I keep my medicine? Keep out of the reach of children. This medicine can be abused. Keep your medicine in a safe place to protect it from theft. Do not share this medicine with anyone. Selling or giving away this medicine is dangerous and against the law. This medicine may cause accidental overdose and death if it taken by other adults, children, or pets. Mix any unused medicine with a substance like cat litter or coffee grounds. Then throw the medicine away in a sealed container like a sealed bag or a coffee can with a lid. Do not use the medicine after the expiration date. Store at room temperature between 15 and 30 degrees C (59 and 86 degrees F). NOTE: This sheet is a summary. It may not cover all possible information. If you have questions about this medicine, talk to your doctor, pharmacist, or health care provider.  2020 Elsevier/Gold Standard (2018-06-04 13:15:55)

## 2019-03-28 NOTE — Progress Notes (Signed)
PATIENT: Kaitlyn Whitney DOB: 26-Oct-1965  REASON FOR VISIT: follow up HISTORY FROM: patient  HISTORY OF PRESENT ILLNESS: Today 03/28/19:  Kaitlyn Whitney is a 53 year old female with a history of obstructive sleep apnea on CPAP.  Her download indicates that she use her machine nightly for compliance of 100%.  She use her machine greater than 4 hours each night.  On average she uses her machine 6 hours and 50 minutes.  Her residual AHI is 1.2 on 5 to 10 cm of water with EPR 3.  Her leak in the 95th percentile is 13.6 L/min.  Her pressure in the 95th percentile is 9.9 cm of water.  She reports that she continues to have discomfort in the right thigh.  She tried gabapentin but could not tolerate it.  Lyrica was prescribed but she was unaware of this.  Therefore she has not tried it.  She states that anything touches her thigh it causes her discomfort.  She returns today for an evaluation.  HISTORY Kaitlyn Whitney assured me that she is compliantly using her CPAP but did not bring it to today's visit on 03-24-2018, but we are meeting today to follow-up on a nerve conduction and electromyography study that was performed on her lower extremities by Dr. Floyde Parkins on 01-18-2018.  The patient had begun having thigh pain in the anterolateral aspect of her left thigh following a lumbosacral spine surgery she had temporary relief probably because she was on pain pills, once those were weaned off she did develop meralgia paresthetica.  The nerve conduction studies show findings consistent with a left lateral femoral cutaneous neuropathy, this correlates with the sensory testing.  The EMG was within normal limits there was no evidence of denervation or overlying lumbosacral radiculopathy.  There should be no strength loss no foot drop.  The patient was able to cooperate this is not pain-free test and achieved good relaxation.  Important is also that the F wave latencies were normal bilaterally.   Gabapentin was  prescribed but she could not tolerate it.  She reports dizziness, feeling loss of balance, lightheadedness.  She is now no longer using gabapentin we can also office Lyrica or Cymbalta.  We can also offer a referral to a pain interventionist, Dr. Maryjean Ka.    REVIEW OF SYSTEMS: Out of a complete 14 system review of symptoms, the patient complains only of the following symptoms, and all other reviewed systems are negative.  ALLERGIES: Allergies  Allergen Reactions  . Penicillins Other (See Comments)    Yeast infection Has patient had a PCN reaction causing immediate rash, facial/tongue/throat swelling, SOB or lightheadedness with hypotension: No Has patient had a PCN reaction causing severe rash involving mucus membranes or skin necrosis: No Has patient had a PCN reaction that required hospitalization: Unknown Has patient had a PCN reaction occurring within the last 10 years: No If all of the above answers are "NO", then may proceed with Cephalosporin use.    HOME MEDICATIONS: Outpatient Medications Prior to Visit  Medication Sig Dispense Refill  . amLODipine (NORVASC) 10 MG tablet Take 10 mg by mouth daily.     Marland Kitchen ibuprofen (ADVIL,MOTRIN) 800 MG tablet Take 1 tablet (800 mg total) by mouth every 8 (eight) hours. 30 tablet 0  . zolpidem (AMBIEN) 10 MG tablet TAKE 1 TABLET (10 MG TOTAL) BY MOUTH NIGHTLY AS NEEDED FOR UP TO 30 DAYS FOR SLEEP.  3  . pregabalin (LYRICA) 25 MG capsule Take 1 capsule (25 mg total)  by mouth 2 (two) times daily. After 30 days will increase dose as tolerated to 100 mg daily. (Patient not taking: Reported on 03/28/2019) 60 capsule 0   No facility-administered medications prior to visit.     PAST MEDICAL HISTORY: Past Medical History:  Diagnosis Date  . Anxiety   . Arthritis    hands  . History of kidney stones 2016  . Hypertension   . Obesity   . Restless leg syndrome   . Sleep apnea    c-pap    PAST SURGICAL HISTORY: Past Surgical History:  Procedure  Laterality Date  . ABDOMINAL HYSTERECTOMY    . bone spur removal Left   . BREAST SURGERY    . HYSTERECTOMY ABDOMINAL WITH SALPINGECTOMY Bilateral 08/18/2017   Procedure: SUPRACERVICAL HYSTERECTOMY ABDOMINAL;  Surgeon: [name], MD;  Location: [facility];  Service: Gynecology;  Laterality: Bilateral;  . LAPAROSCOPIC GASTRIC BANDING    . LYSIS OF ADHESION N/A 08/18/2017   Procedure: LYSIS OF ADHESION;  Surgeon: [name], MD;  Location: [facility];  Service: Gynecology;  Laterality: N/A;  . SALPINGOOPHORECTOMY Bilateral 08/18/2017   Procedure: SALPINGO OOPHORECTOMY;  Surgeon: [name], MD;  Location: [facility];  Service: Gynecology;  Laterality: Bilateral;  . TUBAL LIGATION  2005    FAMILY HISTORY: Family History  Problem Relation Age of Onset  . Asthma Mother   . Cancer Father   . Drug abuse Father   . Drug abuse Brother   . Diabetes Brother   . Drug abuse Brother   . Heart attack Brother   . Stroke Brother   . Asthma Other   . Hypertension Other   . Stroke Other     SOCIAL HISTORY: Social History   Socioeconomic History  . Marital status: Married    Spouse name: Not on file  . Number of children: Not on file  . Years of education: Not on file  . Highest education level: Not on file  Occupational History  . Not on file  Social Needs  . Financial resource strain: Not on file  . Food insecurity    Worry: Not on file    Inability: Not on file  . Transportation needs    Medical: Not on file    Non-medical: Not on file  Tobacco Use  . Smoking status: Current Every Day Smoker    Packs/day: 0.10    Years: 30.00    Pack years: 3.00    Types: Cigarettes  . Smokeless tobacco: Never Used  . Tobacco comment: 10 cig per day  Substance and Sexual Activity  . Alcohol use: No  . Drug use: No  . Sexual activity: Yes    Birth control/protection: Surgical  Lifestyle  . Physical activity    Days per week: Not on file    Minutes per session: Not on file  . Stress:  Not on file  Relationships  . Social Herbalist on phone: Not on file    Gets together: Not on file    Attends religious service: Not on file    Active member of club or organization: Not on file    Attends meetings of clubs or organizations: Not on file    Relationship status: Not on file  . Intimate partner violence    Fear of current or ex partner: Not on file    Emotionally abused: Not on file    Physically abused: Not on file    Forced sexual activity:  Not on file  Other Topics Concern  . Not on file  Social History Narrative  . Not on file      PHYSICAL EXAM  Vitals:   03/28/19 1122  BP: (!) 150/91  Pulse: 80  Temp: (!) 97.4 F (36.3 C)  TempSrc: Oral  Weight: 249 lb 9.6 oz (113.2 kg)  Height: 5' (1.524 m)   Body mass index is 48.75 kg/m.  Generalized: Well developed, in no acute distress  Chest: Lungs clear to auscultation bilaterally  Neurological examination  Mentation: Alert oriented to time, place, history taking. Follows all commands speech and language fluent Cranial nerve II-XII: Extraocular movements were full, visual field were full on confrontational test Head turning and shoulder shrug  were normal and symmetric. Motor: The motor testing reveals 5 over 5 strength of all 4 extremities. Good symmetric motor tone is noted throughout.  Sensory: Sensory testing is intact to soft touch on all 4 extremities.  Numbness on the right anterior all lateral thigh.  No evidence of extinction is noted.  Gait and station: Gait is normal.    DIAGNOSTIC DATA (LABS, IMAGING, TESTING) - I reviewed patient records, labs, notes, testing and imaging myself where available.  Lab Results  Component Value Date   WBC 9.1 08/19/2017   HGB 11.5 (L) 08/19/2017   HCT 33.6 (L) 08/19/2017   MCV 89.4 08/19/2017   PLT 247 08/19/2017      Component Value Date/Time   NA 133 (L) 08/19/2017 0525   K 3.7 08/19/2017 0525   CL 104 08/19/2017 0525   CO2 23 08/19/2017  0525   GLUCOSE 109 (H) 08/19/2017 0525   GLUCOSE 93 04/15/2006 1057   BUN 8 08/19/2017 0525   CREATININE 0.48 08/19/2017 0525   CALCIUM 7.8 (L) 08/19/2017 0525   PROT 6.7 11/04/2016 0743   ALBUMIN 3.3 (L) 11/04/2016 0743   AST 16 11/04/2016 0743   ALT 15 11/04/2016 0743   ALKPHOS 68 11/04/2016 0743   BILITOT 1.0 11/04/2016 0743   GFRNONAA >60 08/19/2017 0525   GFRAA >60 08/19/2017 0525   Lab Results  Component Value Date   CHOL 151 06/30/2007   HDL 31.6 (L) 06/30/2007   LDLCALC 110 (H) 06/30/2007   TRIG 48 06/30/2007   CHOLHDL 4.8 CALC 06/30/2007   No results found for: HGBA1C Lab Results  Component Value Date   VITAMINB12 682 05/22/2009   Lab Results  Component Value Date   TSH 1.52 05/22/2009      ASSESSMENT AND PLAN 53 y.o. year old female  has a past medical history of Anxiety, Arthritis, History of kidney stones (2016), Hypertension, Obesity, Restless leg syndrome, and Sleep apnea. here with:  1.  Obstructive sleep apnea on CPAP 2.  Meralgia paresthetica  Patient CPAP download shows excellent compliance and good treatment of her apnea.  She is encouraged to continue using CPAP nightly and greater than 4 hours each night.  She will be started on Lyrica 25 mg at bedtime for 1 week then increasing to 25 mg twice a week if tolerated well.  She is advised that if her symptoms worsen or she develops new symptoms she should let us know.   She will follow-up in 1 year  I spent 15 minutes with the patient. 50% of this time was spent reviewing CPAP download   Ward Givens, MSN, NP-C 03/28/2019, 11:30 AM [facility] [address], [place], [place] [address] [phone]

## 2019-04-13 DIAGNOSIS — G4733 Obstructive sleep apnea (adult) (pediatric): Secondary | ICD-10-CM | POA: Diagnosis not present

## 2019-05-29 ENCOUNTER — Other Ambulatory Visit: Payer: Self-pay | Admitting: Adult Health

## 2019-09-19 ENCOUNTER — Other Ambulatory Visit: Payer: Self-pay

## 2019-09-19 ENCOUNTER — Encounter (HOSPITAL_COMMUNITY): Payer: Self-pay

## 2019-09-19 ENCOUNTER — Ambulatory Visit (HOSPITAL_COMMUNITY)
Admission: EM | Admit: 2019-09-19 | Discharge: 2019-09-19 | Disposition: A | Discharge status: Home / Self Care | Payer: BC Managed Care – PPO | Attending: Family Medicine | Admitting: Family Medicine

## 2019-09-19 DIAGNOSIS — R42 Dizziness and giddiness: Secondary | ICD-10-CM | POA: Diagnosis not present

## 2019-09-19 DIAGNOSIS — G43009 Migraine without aura, not intractable, without status migrainosus: Secondary | ICD-10-CM

## 2019-09-19 MED ORDER — MECLIZINE HCL 25 MG PO TABS: 25.0000 mg | ORAL_TABLET | Freq: Three times a day (TID) | ORAL | 0 refills | Status: DC | PRN

## 2019-09-19 MED ORDER — ONDANSETRON HCL 8 MG PO TABS: 8.0000 mg | ORAL_TABLET | Freq: Three times a day (TID) | ORAL | 0 refills | Status: DC | PRN

## 2019-09-19 MED ORDER — BUTALBITAL-APAP-CAFFEINE 50-325-40 MG PO TABS: 1.0000 | ORAL_TABLET | Freq: Four times a day (QID) | ORAL | 0 refills | Status: DC | PRN

## 2019-09-19 NOTE — ED Provider Notes (Signed)
[place]    CSN: [id number] Arrival date & time: 09/19/19  1021      History   Chief Complaint Chief Complaint  Patient presents with  . Dizziness  . Headache    HPI Kaitlyn Whitney is a 54 y.o. female.   HPI  Patient states she has had a headache for couple days.  She has a headache and migraine history.  She did not think much of it has been taking over-the-counter medications.  Headache is dull but worse today.  When she leaned over today and then stood up she felt a spinning sensation.  She talked to her brother who scared her, he told her she might be having a stroke.  She has known hypertension, and tobacco abuse.  She did not have any numbness or weakness, change in balance or coordination, change in mentation, change in vision.  She is here with her husband.  He drove her to the visit.  No recent head injury.  No infection or sinus symptoms although patient admits to having "bad allergies".  She is on Norvasc for blood pressure.  Generally well controlled.  She does see neurology for meralgia paresthetica in her left leg.  This is unchanged  Past Medical History:  Diagnosis Date  . Anxiety   . Arthritis    hands  . History of kidney stones 2016  . Hypertension   . Obesity   . Restless leg syndrome   . Sleep apnea    c-pap    Patient Active Problem List   Diagnosis Date Noted  . Neuralgia of groin 11/30/2017  . Neuropathy 09/23/2017  . Class 3 severe obesity with body mass index (BMI) of 45.0 to 49.9 in adult ([place]) 07/22/2017  . Status post bariatric surgery 07/22/2017  . Other insomnia 07/22/2017  . Visit for routine gyn exam 04/28/2017  . History of laparoscopic adjustable gastric banding, APL.  10/23/2008. 09/02/2012  . MERALGIA PARESTHETICA 03/07/2008  . TOBACCO USE DISORDER/SMOKER-SMOKING CESSATION DISCUSSED 09/29/2007  . OBESITY, MORBID 07/01/2007  . DEPRESSION 07/01/2007  . OBSTRUCTIVE SLEEP APNEA 07/01/2007  . ELEVATED BP 07/01/2007     Past Surgical History:  Procedure Laterality Date  . ABDOMINAL HYSTERECTOMY    . bone spur removal Left   . BREAST SURGERY    . HYSTERECTOMY ABDOMINAL WITH SALPINGECTOMY Bilateral 08/18/2017   Procedure: SUPRACERVICAL HYSTERECTOMY ABDOMINAL;  Surgeon: [name], MD;  Location: [facility];  Service: Gynecology;  Laterality: Bilateral;  . LAPAROSCOPIC GASTRIC BANDING    . LYSIS OF ADHESION N/A 08/18/2017   Procedure: LYSIS OF ADHESION;  Surgeon: [name], MD;  Location: [facility];  Service: Gynecology;  Laterality: N/A;  . SALPINGOOPHORECTOMY Bilateral 08/18/2017   Procedure: SALPINGO OOPHORECTOMY;  Surgeon: [name], MD;  Location: [facility];  Service: Gynecology;  Laterality: Bilateral;  . TUBAL LIGATION  2005    OB History    Gravida  0   Para      Term      Preterm      AB      Living        SAB      TAB      Ectopic      Multiple      Live Births               Home Medications    Prior to Admission medications   Medication Sig Start Date End Date Taking? Authorizing Provider  amLODipine (  NORVASC) 10 MG tablet Take 10 mg by mouth daily.     [provider]  butalbital-acetaminophen-caffeine (FIORICET) 5850068887 MG tablet Take 1-2 tablets by mouth every 6 (six) hours as needed for headache. 09/19/19 09/18/20  Raylene Everts, MD  meclizine (ANTIVERT) 25 MG tablet Take 1 tablet (25 mg total) by mouth 3 (three) times daily as needed for dizziness. 09/19/19   Raylene Everts, MD  ondansetron (ZOFRAN) 8 MG tablet Take 1 tablet (8 mg total) by mouth every 8 (eight) hours as needed for nausea or vomiting. 09/19/19   Raylene Everts, MD  pregabalin (LYRICA) 25 MG capsule TAKE 1 CAPSULE (25 MG TOTAL) BY MOUTH 2 (TWO) TIMES DAILY. 06/02/19   Ward Givens, NP  zolpidem (AMBIEN) 10 MG tablet TAKE 1 TABLET (10 MG TOTAL) BY MOUTH NIGHTLY AS NEEDED FOR UP TO 30 DAYS FOR SLEEP. 06/18/17   [provider]    Family History Family History   Problem Relation Age of Onset  . Asthma Mother   . Cancer Father   . Drug abuse Father   . Drug abuse Brother   . Diabetes Brother   . Drug abuse Brother   . Heart attack Brother   . Stroke Brother   . Asthma Other   . Hypertension Other   . Stroke Other     Social History Social History   Tobacco Use  . Smoking status: Current Every Day Smoker    Packs/day: 0.10    Years: 30.00    Pack years: 3.00    Types: Cigarettes  . Smokeless tobacco: Never Used  . Tobacco comment: 10 cig per day  Substance Use Topics  . Alcohol use: No  . Drug use: No     Allergies   Penicillins   Review of Systems Review of Systems  Gastrointestinal: Positive for nausea. Negative for vomiting.  Neurological: Positive for dizziness and headaches. Negative for syncope, speech difficulty, weakness and numbness.     Physical Exam Triage Vital Signs ED Triage Vitals  Enc Vitals Group     BP 09/19/19 1106 (!) 142/89     Pulse Rate 09/19/19 1106 77     Resp 09/19/19 1106 18     Temp 09/19/19 1106 98.4 F (36.9 C)     Temp Source 09/19/19 1106 Oral     SpO2 09/19/19 1106 98 %     Weight 09/19/19 1105 240 lb (108.9 kg)     Height --      Head Circumference --      Peak Flow --      Pain Score 09/19/19 1105 7     Pain Loc --      Pain Edu? --      Excl. in [place]? --    No data found.  Updated Vital Signs BP (!) 142/89 (BP Location: Right Arm)   Pulse 77   Temp 98.4 F (36.9 C) (Oral)   Resp 18   Wt 108.9 kg   LMP 12/20/2016   SpO2 98%   BMI 46.87 kg/m      Physical Exam Constitutional:      General: She is not in acute distress.    Appearance: She is well-developed. She is obese.  HENT:     Head: Normocephalic and atraumatic.  Eyes:     General: No visual field deficit.    Extraocular Movements: Extraocular movements intact.     Right eye: No nystagmus.     Conjunctiva/sclera: Conjunctivae normal.  Pupils: Pupils are equal, round, and reactive to light.   Cardiovascular:     Rate and Rhythm: Normal rate and regular rhythm.     Heart sounds: Normal heart sounds.  Pulmonary:     Effort: Pulmonary effort is normal. No respiratory distress.     Breath sounds: Normal breath sounds.  Musculoskeletal:        General: No swelling or tenderness. Normal range of motion.     Cervical back: Normal range of motion and neck supple.  Skin:    General: Skin is warm and dry.  Neurological:     Mental Status: She is alert. Mental status is at baseline.     Cranial Nerves: No cranial nerve deficit, dysarthria or facial asymmetry.     Motor: No weakness.     Coordination: Coordination normal.     Gait: Gait normal.     Deep Tendon Reflexes: Reflexes normal.  Psychiatric:        Mood and Affect: Mood normal.        Behavior: Behavior normal.      UC Treatments / Results  Labs (all labs ordered are listed, but only abnormal results are displayed) Labs Reviewed - No data to display  EKG   Radiology No results found.  Procedures Procedures (including critical care time)  Medications Ordered in UC Medications - No data to display  Initial Impression / Assessment and Plan / UC Course  I have reviewed the triage vital signs and the nursing notes.  Pertinent labs & imaging results that were available during my care of the patient were reviewed by me and considered in my medical decision making (see chart for details).     See HPI Final Clinical Impressions(s) / UC Diagnoses   Final diagnoses:  Vertigo  Migraine without aura and without status migrainosus, not intractable     Discharge Instructions     Home to rest Treat plenty of fluids Take Fioricet as needed for headache Take Zofran as needed for nausea Take meclizine as needed for vertigo See your primary care doctor if not improving in a few days   ED Prescriptions    Medication Sig Dispense Auth. Provider   butalbital-acetaminophen-caffeine (FIORICET) 50-325-40 MG  tablet Take 1-2 tablets by mouth every 6 (six) hours as needed for headache. 20 tablet Raylene Everts, MD   ondansetron (ZOFRAN) 8 MG tablet Take 1 tablet (8 mg total) by mouth every 8 (eight) hours as needed for nausea or vomiting. 20 tablet Raylene Everts, MD   meclizine (ANTIVERT) 25 MG tablet Take 1 tablet (25 mg total) by mouth 3 (three) times daily as needed for dizziness. 30 tablet Raylene Everts, MD     I have reviewed the PDMP during this encounter.   Raylene Everts, MD 09/19/19 [phone]

## 2019-09-19 NOTE — ED Triage Notes (Signed)
Pt states she has had a headache and dizziness x 4 days. Pt states when she bends over she starts to get very dizzy. X 4 days

## 2019-09-19 NOTE — Discharge Instructions (Addendum)
Home to rest Treat plenty of fluids Take Fioricet as needed for headache Take Zofran as needed for nausea Take meclizine as needed for vertigo See your primary care doctor if not improving in a few days

## 2019-10-11 ENCOUNTER — Telehealth: Payer: Self-pay

## 2019-10-11 NOTE — Telephone Encounter (Signed)
No problem, please let her know she needs to keep her gynecologist.

## 2019-10-11 NOTE — Telephone Encounter (Signed)
Patient called in to see if she can get approved to be a new patient with DR. Paz The patient Husband Mr. Bonnema is a patient of Dr. Larose Kells as well. Please follow up with the patient to let her know if she is approved or not at [phone]

## 2019-10-11 NOTE — Telephone Encounter (Signed)
Please advise 

## 2019-10-11 NOTE — Telephone Encounter (Signed)
Please schedule NP appt at her convenience. Please let her know regarding GYN/female care. TY.

## 2019-10-11 NOTE — Telephone Encounter (Signed)
Called patient left detailed message to call back

## 2019-10-25 ENCOUNTER — Other Ambulatory Visit: Payer: Self-pay

## 2019-10-25 ENCOUNTER — Ambulatory Visit: Payer: BC Managed Care – PPO | Admitting: Internal Medicine

## 2019-10-25 VITALS — BP 156/85 | HR 83 | Temp 96.6°F | Resp 18 | Ht 60.0 in | Wt 243.2 lb

## 2019-10-25 DIAGNOSIS — R1310 Dysphagia, unspecified: Secondary | ICD-10-CM | POA: Diagnosis not present

## 2019-10-25 DIAGNOSIS — G4733 Obstructive sleep apnea (adult) (pediatric): Secondary | ICD-10-CM | POA: Diagnosis not present

## 2019-10-25 DIAGNOSIS — Z1211 Encounter for screening for malignant neoplasm of colon: Secondary | ICD-10-CM

## 2019-10-25 DIAGNOSIS — I10 Essential (primary) hypertension: Secondary | ICD-10-CM | POA: Diagnosis not present

## 2019-10-25 DIAGNOSIS — G5712 Meralgia paresthetica, left lower limb: Secondary | ICD-10-CM

## 2019-10-25 DIAGNOSIS — Z6841 Body Mass Index (BMI) 40.0 and over, adult: Secondary | ICD-10-CM | POA: Diagnosis not present

## 2019-10-25 LAB — COMPREHENSIVE METABOLIC PANEL
ALT: 14 U/L (ref 0–35)
AST: 16 U/L (ref 0–37)
Albumin: 3.8 g/dL (ref 3.5–5.2)
Alkaline Phosphatase: 88 U/L (ref 39–117)
BUN: 13 mg/dL (ref 6–23)
CO2: 27 mEq/L (ref 19–32)
Calcium: 9.5 mg/dL (ref 8.4–10.5)
Chloride: 107 mEq/L (ref 96–112)
Creatinine, Ser: 0.73 mg/dL (ref 0.40–1.20)
GFR: 101.02 mL/min (ref >60.00)
Glucose, Bld: 109 mg/dL — ABNORMAL HIGH (ref 70–99)
Potassium: 3.8 mEq/L (ref 3.5–5.1)
Sodium: 141 mEq/L (ref 135–145)
Total Bilirubin: 0.3 mg/dL (ref 0.2–1.2)
Total Protein: 6.8 g/dL (ref 6.0–8.3)

## 2019-10-25 LAB — CBC WITH DIFFERENTIAL/PLATELET
Basophils Absolute: 0.1 10*3/uL (ref 0.0–0.1)
Basophils Relative: 1.1 % (ref 0.0–3.0)
Eosinophils Absolute: 0.3 10*3/uL (ref 0.0–0.7)
Eosinophils Relative: 4 % (ref 0.0–5.0)
HCT: 42.8 % (ref 36.0–46.0)
Hemoglobin: 14.2 g/dL (ref 12.0–15.0)
Lymphocytes Relative: 45.4 % (ref 12.0–46.0)
Lymphs Abs: 3.1 10*3/uL (ref 0.7–4.0)
MCHC: 33.2 g/dL (ref 30.0–36.0)
MCV: 91.3 fl (ref 78.0–100.0)
Monocytes Absolute: 0.5 10*3/uL (ref 0.1–1.0)
Monocytes Relative: 7.8 % (ref 3.0–12.0)
Neutro Abs: 2.9 10*3/uL (ref 1.4–7.7)
Neutrophils Relative %: 41.7 % — ABNORMAL LOW (ref 43.0–77.0)
Platelets: 280 10*3/uL (ref 150.0–400.0)
RBC: 4.68 Mil/uL (ref 3.87–5.11)
RDW: 14.3 % (ref 11.5–15.5)
WBC: 6.8 10*3/uL (ref 4.0–10.5)

## 2019-10-25 LAB — TSH: TSH: 1.29 u[IU]/mL (ref 0.35–4.50)

## 2019-10-25 LAB — LIPID PANEL
Cholesterol: 172 mg/dL (ref 0–200)
HDL: 36.6 mg/dL — ABNORMAL LOW (ref >39.00)
LDL Cholesterol: 119 mg/dL — ABNORMAL HIGH (ref 0–99)
NonHDL: 135.56
Total CHOL/HDL Ratio: 5
Triglycerides: 83 mg/dL (ref 0.0–149.0)
VLDL: 16.6 mg/dL (ref 0.0–40.0)

## 2019-10-25 MED ORDER — PANTOPRAZOLE SODIUM 40 MG PO TBEC: 40.0000 mg | DELAYED_RELEASE_TABLET | Freq: Every day | ORAL | 3 refills | Status: DC

## 2019-10-25 NOTE — Progress Notes (Signed)
Subjective:    Patient ID: Kaitlyn Whitney, female    DOB: 14-Nov-1965, 54 y.o.   MRN: [id number]  DOS:  10/25/2019 Type of visit - description:  New patient. Multiple issues were discussed including HTN, meralgia paresthetica, morbid obesity. She also has a new concern which is dysphagia to solids: Long history of heartburn for years, not recently treated with any medications. Denies any fever chills or weight loss No blood in the stools Denies odynophagia  Review of Systems See above   Past Medical History:  Diagnosis Date  . Anxiety   . Arthritis    hands  . History of kidney stones 2016  . Hypertension   . Obesity   . Restless leg syndrome   . Sleep apnea    c-pap    Past Surgical History:  Procedure Laterality Date  . bone spur removal Left   . BREAST SURGERY  2000   breast reduccion  . HYSTERECTOMY ABDOMINAL WITH SALPINGECTOMY Bilateral 08/18/2017   Procedure: SUPRACERVICAL HYSTERECTOMY ABDOMINAL;  Surgeon: [name], MD;  Location: [facility];  Service: Gynecology;  Laterality: Bilateral;  . LAPAROSCOPIC GASTRIC BANDING  ~ 2010  . LYSIS OF ADHESION N/A 08/18/2017   Procedure: LYSIS OF ADHESION;  Surgeon: [name], MD;  Location: [facility];  Service: Gynecology;  Laterality: N/A;  . SALPINGOOPHORECTOMY Bilateral 08/18/2017   Procedure: SALPINGO OOPHORECTOMY;  Surgeon: [name], MD;  Location: [facility];  Service: Gynecology;  Laterality: Bilateral;  . TUBAL LIGATION  2005   Family History  Problem Relation Age of Onset  . Asthma Mother   . Cancer Father   . Drug abuse Father   . Drug abuse Brother   . Diabetes Brother   . Drug abuse Brother   . Heart attack Brother   . Stroke Brother   . Asthma Other   . Hypertension Other   . Colon cancer Neg Hx   . Breast cancer Neg Hx     Allergies as of 10/25/2019      Reactions   Penicillins Other (See Comments)   Yeast infection Has patient had a PCN reaction causing immediate rash, facial/tongue/throat  swelling, SOB or lightheadedness with hypotension: No Has patient had a PCN reaction causing severe rash involving mucus membranes or skin necrosis: No Has patient had a PCN reaction that required hospitalization: Unknown Has patient had a PCN reaction occurring within the last 10 years: No If all of the above answers are "NO", then may proceed with Cephalosporin use.      Medication List       Accurate as of Oct 25, 2019 11:59 PM. If you have any questions, ask your nurse or doctor.        STOP taking these medications   pregabalin 25 MG capsule Commonly known as: LYRICA Stopped by: Kathlene November, MD     TAKE these medications   amLODipine 10 MG tablet Commonly known as: NORVASC Take 10 mg by mouth daily.   butalbital-acetaminophen-caffeine 50-325-40 MG tablet Commonly known as: FIORICET Take 1-2 tablets by mouth every 6 (six) hours as needed for headache.   meclizine 25 MG tablet Commonly known as: ANTIVERT Take 1 tablet (25 mg total) by mouth 3 (three) times daily as needed for dizziness.   ondansetron 8 MG tablet Commonly known as: Zofran Take 1 tablet (8 mg total) by mouth every 8 (eight) hours as needed for nausea or vomiting.   pantoprazole 40 MG tablet Commonly known as: PROTONIX  Take 1 tablet (40 mg total) by mouth daily before breakfast. Started by: Kathlene November, MD   zolpidem 10 MG tablet Commonly known as: AMBIEN TAKE 1 TABLET (10 MG TOTAL) BY MOUTH NIGHTLY AS NEEDED FOR UP TO 30 DAYS FOR SLEEP.          Objective:   Physical Exam BP (!) 156/85 (BP Location: Left Arm, Patient Position: Sitting, Cuff Size: Normal)   Pulse 83   Temp (!) 96.6 F (35.9 C) (Temporal)   Resp 18   Ht 5' (1.524 m)   Wt 243 lb 4 oz (110.3 kg)   LMP 12/20/2016   SpO2 100%   BMI 47.51 kg/m  General:   Well developed, NAD, BMI noted.  HEENT:  Normocephalic . Face symmetric, atraumatic Neck: No thyromegaly, no lymphadenopathies Lungs:  CTA B Normal respiratory effort, no  intercostal retractions, no accessory muscle use. Heart: RRR,  no murmur.  Abdomen:  Not distended, soft, non-tender. No rebound or rigidity.   Skin: Not pale. Not jaundice Lower extremities: no pretibial edema bilaterally  Neurologic:  alert & oriented X3.  Speech normal, gait appropriate for age and unassisted Psych--  Cognition and judgment appear intact.  Cooperative with normal attention span and concentration.  Behavior appropriate. No anxious or depressed appearing.     Assessment    Assessment (new patient 10/2019 referred by her husband) HTN OSA (per [facility]) Morbid obesity, bariatric surgery 2010 H/o meralgia paresthetica  01/2018: Nerve conduction studies done on both lower extremities shows findings consistent with a left lateral femoral cutaneous neuropathy.  Sensory abnormalities reported by the patient in the left leg correlate well with this finding.  EMG evaluation of the left lower extremity is within normal limits, no evidence of an overlying lumbosacral radiculopathy is seen.   PLAN HTN: Currently on amlodipine, no edema, BP today slightly elevated, recommend check BPs at home, check a CMP and CBC OSA: Follow-up by neurology Meralgia paresthetica: Complain of left leg burning/hot knife feelings on and off at different places, symptoms started after hysterectomy, subsequently she said she had some sort of back surgery (no documentation in the chart) but symptoms continue. Eventually seen by neurology, NCS consistent with left lateral femoral cutaneous neuropathy, was prescribed 3 different medications, the last one was Lyrica which did not work. Morbid obesity: Status post bariatric surgery in 2010, will check FLP and TSH GERD: Chronic symptoms, untreated, complaining of dysphagia to solids for the last few months.  Referred to GI, for consideration of endoscopy, she is also due for colonoscopy Preventive care:  C-scope 11/2007, hyperplastic polyps. RTC CPX 3  months  This visit occurred during the SARS-CoV-2 public health emergency.  Safety protocols were in place, including screening questions prior to the visit, additional usage of staff PPE, and extensive cleaning of exam room while observing appropriate contact time as indicated for disinfecting solutions.

## 2019-10-25 NOTE — Progress Notes (Signed)
Pre visit review using our clinic review tool, if applicable. No additional management support is needed unless otherwise documented below in the visit note. 

## 2019-10-25 NOTE — Patient Instructions (Addendum)
COVID-19 Vaccine Information can be found at: [email or web address].uk For questions related to vaccine distribution or appointments, please email [email or web address] or call [phone].   Check your blood pressure once or twice a month BP GOAL is between 110/65 and  135/85. If it is consistently higher or lower, let me know   Start a medication called pantoprazole, take 1 before breakfast  GO TO THE LAB : Get the blood work     [place], [place] back for a physical exam in 3 months

## 2019-10-26 ENCOUNTER — Other Ambulatory Visit (INDEPENDENT_AMBULATORY_CARE_PROVIDER_SITE_OTHER): Payer: BC Managed Care – PPO

## 2019-10-26 ENCOUNTER — Encounter: Payer: Self-pay | Admitting: Internal Medicine

## 2019-10-26 ENCOUNTER — Encounter: Payer: Self-pay | Admitting: Gastroenterology

## 2019-10-26 DIAGNOSIS — R739 Hyperglycemia, unspecified: Secondary | ICD-10-CM | POA: Diagnosis not present

## 2019-10-26 DIAGNOSIS — Z09 Encounter for follow-up examination after completed treatment for conditions other than malignant neoplasm: Secondary | ICD-10-CM | POA: Insufficient documentation

## 2019-10-26 LAB — HEMOGLOBIN A1C: Hgb A1c MFr Bld: 5.8 % (ref 4.6–6.5)

## 2019-10-26 NOTE — Assessment & Plan Note (Signed)
HTN: Currently on amlodipine, no edema, BP today slightly elevated, recommend check BPs at home, check a CMP and CBC OSA: Follow-up by neurology Meralgia paresthetica: Complain of left leg burning/hot knife feelings on and off at different places, symptoms started after hysterectomy, subsequently she said she had some sort of back surgery (no documentation in the chart) but symptoms continue. Eventually seen by neurology, NCS consistent with left lateral femoral cutaneous neuropathy, was prescribed 3 different medications, the last one was Lyrica which did not work. Morbid obesity: Status post bariatric surgery in 2010, will check FLP and TSH GERD: Chronic symptoms, untreated, complaining of dysphagia to solids for the last few months.  Referred to GI, for consideration of endoscopy, she is also due for colonoscopy Preventive care:  C-scope 11/2007, hyperplastic polyps. RTC CPX 3 months

## 2019-11-18 ENCOUNTER — Other Ambulatory Visit: Payer: Self-pay | Admitting: Internal Medicine

## 2019-12-01 ENCOUNTER — Ambulatory Visit: Payer: BC Managed Care – PPO | Admitting: Gastroenterology
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
## 2019-12-30 ENCOUNTER — Encounter: Payer: Self-pay | Admitting: Internal Medicine

## 2020-01-25 ENCOUNTER — Encounter: Payer: BC Managed Care – PPO | Admitting: Internal Medicine

## 2020-02-13 ENCOUNTER — Encounter: Payer: BC Managed Care – PPO | Admitting: Internal Medicine

## 2020-02-15 ENCOUNTER — Ambulatory Visit (INDEPENDENT_AMBULATORY_CARE_PROVIDER_SITE_OTHER): Payer: BC Managed Care – PPO | Admitting: Gastroenterology

## 2020-02-15 ENCOUNTER — Encounter: Payer: Self-pay | Admitting: Gastroenterology

## 2020-02-15 VITALS — BP 130/70 | HR 89 | Ht 60.0 in | Wt 239.0 lb

## 2020-02-15 DIAGNOSIS — R1013 Epigastric pain: Secondary | ICD-10-CM

## 2020-02-15 DIAGNOSIS — R112 Nausea with vomiting, unspecified: Secondary | ICD-10-CM

## 2020-02-15 DIAGNOSIS — Z1211 Encounter for screening for malignant neoplasm of colon: Secondary | ICD-10-CM

## 2020-02-15 DIAGNOSIS — R1319 Other dysphagia: Secondary | ICD-10-CM

## 2020-02-15 DIAGNOSIS — R1310 Dysphagia, unspecified: Secondary | ICD-10-CM

## 2020-02-15 DIAGNOSIS — Z01818 Encounter for other preprocedural examination: Secondary | ICD-10-CM

## 2020-02-15 NOTE — Progress Notes (Signed)
Kaitlyn Whitney:  History: Kaitlyn Whitney 02/15/2020  Referring provider: Colon Branch, MD  Reason for consult/chief complaint: Dysphagia (her spit is full of "bubbles", trouble with both liquids and solids , onset x 3 months) and Colon Cancer Screening (hx of polyps, last colon 2009 with Dr Kaitlyn Whitney, polyps and diverticulosis)   Subjective  HPI:  This is a very pleasant 54 year old woman referred by primary care for dysphagia.  About 3 months ago she started feeling food getting hung up in her throat or upper chest "like there was a shelf".  She would also previously get some heartburn symptoms that would improve if she drank cold water.  The dysphagia was starting to escalate about 2 months ago, heartburn was no longer getting better even with a PPI prescribed by primary care.  She started having some occasional twisting upper abdominal discomfort and intermittent postprandial nausea and vomiting.  She feels excess saliva production and bubbles.  She is having dysphagia with both solids and liquids.  The symptoms of also is led to about a 15 pound weight loss in the last several weeks.  Last colonoscopy by Dr. Deatra Whitney June 2009 for change in bowel habits, mild left-sided diverticulosis and sigmoid hyperplastic polyp. Has occasional constipation, denies rectal bleeding. ROS:  Review of Systems  Constitutional: Negative for appetite change and unexpected weight change.  HENT: Negative for mouth sores and voice change.   Eyes: Negative for pain and redness.  Respiratory: Negative for cough and shortness of breath.   Cardiovascular: Negative for chest pain and palpitations.  Genitourinary: Negative for dysuria and hematuria.  Musculoskeletal: Positive for arthralgias. Negative for myalgias.  Skin: Negative for pallor and rash.  Neurological: Negative for weakness and headaches.  Hematological: Negative for adenopathy.     Past Medical History: Past Medical  History:  Diagnosis Date  . Anxiety   . Arthritis    hands  . History of kidney stones 2016  . Hypertension   . Obesity   . Restless leg syndrome   . Sleep apnea    c-pap     Past Surgical History: Past Surgical History:  Procedure Laterality Date  . bone spur removal     left side of lumbar spine  . BREAST SURGERY  2000   breast reduccion  . HYSTERECTOMY ABDOMINAL WITH SALPINGECTOMY Bilateral 08/18/2017   Procedure: SUPRACERVICAL HYSTERECTOMY ABDOMINAL;  Surgeon: Kaitlyn Milroy, MD;  Location: [facility];  Service: Gynecology;  Laterality: Bilateral;  . LAPAROSCOPIC GASTRIC BANDING  ~ 2010  . LYSIS OF ADHESION N/A 08/18/2017   Procedure: LYSIS OF ADHESION;  Surgeon: Kaitlyn Milroy, MD;  Location: [facility];  Service: Gynecology;  Laterality: N/A;  . SALPINGOOPHORECTOMY Bilateral 08/18/2017   Procedure: SALPINGO OOPHORECTOMY;  Surgeon: Kaitlyn Milroy, MD;  Location: [facility];  Service: Gynecology;  Laterality: Bilateral;  . TUBAL LIGATION  2005   She has not seen her bariatric surgeon for many years.  Family History: Family History  Problem Relation Age of Onset  . Asthma Mother   . Drug abuse Father   . HIV Father   . Drug abuse Brother   . Diabetes Brother   . Drug abuse Brother   . Heart attack Brother   . Stroke Brother   . Asthma Other   . Hypertension Other   . Colon cancer Neg Hx   . Breast cancer Neg Hx     Social History: Social History   Socioeconomic History  .  Marital status: Married    Spouse name: Not on file  . Number of children: 0  . Years of education: Not on file  . Highest education level: Not on file  Occupational History  . Occupation: unemplyed   Tobacco Use  . Smoking status: Current Every Day Smoker    Packs/day: 0.50    Years: 30.00    Pack years: 15.00    Types: Cigarettes    Start date: 41  . Smokeless tobacco: Never Used  . Tobacco comment: 10 cig per day  Vaping Use  . Vaping Use: Never used  Substance and Sexual Activity    . Alcohol use: No  . Drug use: No  . Sexual activity: Yes    Birth control/protection: Surgical  Other Topics Concern  . Not on file  Social History Narrative   2 adopted children   Household: pt, husband and daughter    Social Determinants of Radio broadcast assistant Strain:   . Difficulty of Paying Living Expenses: Not on file  Food Insecurity:   . Worried About Charity fundraiser in the Last Year: Not on file  . Ran Out of Food in the Last Year: Not on file  Transportation Needs:   . Lack of Transportation (Medical): Not on file  . Lack of Transportation (Non-Medical): Not on file  Physical Activity:   . Days of Exercise per Week: Not on file  . Minutes of Exercise per Session: Not on file  Stress:   . Feeling of Stress : Not on file  Social Connections:   . Frequency of Communication with Friends and Family: Not on file  . Frequency of Social Gatherings with Friends and Family: Not on file  . Attends Religious Services: Not on file  . Active Member of Clubs or Organizations: Not on file  . Attends Archivist Meetings: Not on file  . Marital Status: Not on file    Allergies: Allergies  Allergen Reactions  . Penicillins Other (See Comments)    Yeast infection Has patient had a PCN reaction causing immediate rash, facial/tongue/throat swelling, SOB or lightheadedness with hypotension: No Has patient had a PCN reaction causing severe rash involving mucus membranes or skin necrosis: No Has patient had a PCN reaction that required hospitalization: Unknown Has patient had a PCN reaction occurring within the last 10 years: No If all of the above answers are "NO", then may proceed with Cephalosporin use.    Outpatient Meds: Current Outpatient Medications  Medication Sig Dispense Refill  . amLODipine (NORVASC) 10 MG tablet Take 10 mg by mouth daily.     . butalbital-acetaminophen-caffeine (FIORICET) 50-325-40 MG tablet Take 1-2 tablets by mouth every 6  (six) hours as needed for headache. 20 tablet 0  . meclizine (ANTIVERT) 25 MG tablet Take 1 tablet (25 mg total) by mouth 3 (three) times daily as needed for dizziness. 30 tablet 0  . ondansetron (ZOFRAN) 8 MG tablet Take 1 tablet (8 mg total) by mouth every 8 (eight) hours as needed for nausea or vomiting. 20 tablet 0  . pantoprazole (PROTONIX) 40 MG tablet Take 1 tablet (40 mg total) by mouth daily before breakfast. 90 tablet 3  . zolpidem (AMBIEN) 10 MG tablet TAKE 1 TABLET (10 MG TOTAL) BY MOUTH NIGHTLY AS NEEDED FOR UP TO 30 DAYS FOR SLEEP.  3   No current facility-administered medications for this visit.      ___________________________________________________________________ Objective   Exam:  BP 130/70  Pulse 89   Ht 5' (1.524 m)   Wt 239 lb (108.4 kg)   LMP 12/20/2016   BMI 46.68 kg/m    General: Well-appearing, pleasant and conversational.  Normal vocal quality.  Eyes: sclera anicteric, no redness  ENT: oral mucosa moist without lesions, no cervical or supraclavicular lymphadenopathy.  No loose teeth.  CV: RRR without murmur, S1/S2, no JVD, no peripheral edema  Resp: clear to auscultation bilaterally, normal RR and effort noted  GI: soft, morbidly obese, no tenderness, with active bowel sounds.  Liver edge just felt on deep inspiration and midclavicular line.  Lap band port just right of midline mid abdomen.  Skin; warm and dry, no rash or jaundice noted  Neuro: awake, alert and oriented x 3. Normal gross motor function and fluent speech  Labs:  CBC Latest Ref Rng & Units 10/25/2019 08/19/2017 08/07/2017  WBC 4.0 - 10.5 K/uL 6.8 9.1 6.5  Hemoglobin 12.0 - 15.0 g/dL 14.2 11.5(L) 14.0  Hematocrit 36 - 46 % 42.8 33.6(L) 41.2  Platelets 150 - 400 K/uL 280.0 247 308   CMP Latest Ref Rng & Units 10/25/2019 08/19/2017 08/07/2017  Glucose 70 - 99 mg/dL 109(H) 109(H) 96  BUN 6 - 23 mg/dL 13 8 10   Creatinine 0.40 - 1.20 mg/dL 0.73 0.48 0.58  Sodium 135 - 145 mEq/L 141  133(L) 136  Potassium 3.5 - 5.1 mEq/L 3.8 3.7 3.6  Chloride 96 - 112 mEq/L 107 104 108  CO2 19 - 32 mEq/L 27 23 22   Calcium 8.4 - 10.5 mg/dL 9.5 7.8(L) 8.4(L)  Total Protein 6.0 - 8.3 g/dL 6.8 - -  Total Bilirubin 0.2 - 1.2 mg/dL 0.3 - -  Alkaline Phos 39 - 117 U/L 88 - -  AST 0 - 37 U/L 16 - -  ALT 0 - 35 U/L 14 - -  Albumin 3.8   Assessment: Encounter Diagnoses  Name Primary?  . Esophageal dysphagia Yes  . Epigastric pain   . Nausea and vomiting in adult   . Special screening for malignant neoplasms, colon     Worsening symptoms over several months, primarily esophageal dysphagia both solids and liquids, but also epigastric discomfort with nausea and vomiting.  Perhaps severe reflux with stricture, must also consider delayed gastric emptying, gastric and/or esophageal motility disturbance from lap band as well as neoplasia. I feel we should understand and address her upper GI symptoms before proceeding with screening colonoscopy.  I doubt she will be able to tolerate the bowel preparation given the current symptoms. Plan:  Upper endoscopy with possible dilation within next 2 weeks.  She was agreeable after discussion of procedure and risks.  The benefits and risks of the planned procedure were described in detail with the patient or (when appropriate) their health care proxy.  Risks were outlined as including, but not limited to, bleeding, infection, perforation, adverse medication reaction leading to cardiac or pulmonary decompensation, pancreatitis (if ERCP).  The limitation of incomplete mucosal visualization was also discussed.  No guarantees or warranties were given.  Patient at increased risk for cardiopulmonary complications of procedure due to medical comorbidities.    Thank you for the courtesy of this consult.  Please call me with any questions or concerns.  Nelida Meuse III  CC: Referring provider noted above

## 2020-02-15 NOTE — Patient Instructions (Signed)
If you are age 53 or older, your body mass index should be between 23-30. Your Body mass index is 46.68 kg/m. If this is out of the aforementioned range listed, please consider follow up with your Primary Care Provider.  If you are age 60 or younger, your body mass index should be between 19-25. Your Body mass index is 46.68 kg/m. If this is out of the aformentioned range listed, please consider follow up with your Primary Care Provider.   You have been scheduled for an endoscopy. Please follow written instructions given to you at your visit today. If you use inhalers (even only as needed), please bring them with you on the day of your procedure.  It was a pleasure to see you today!  Dr. Loletha Carrow

## 2020-02-21 ENCOUNTER — Encounter: Payer: Self-pay | Admitting: Gastroenterology

## 2020-02-22 ENCOUNTER — Ambulatory Visit (INDEPENDENT_AMBULATORY_CARE_PROVIDER_SITE_OTHER): Payer: BC Managed Care – PPO | Admitting: Internal Medicine

## 2020-02-22 ENCOUNTER — Encounter: Payer: Self-pay | Admitting: Internal Medicine

## 2020-02-22 ENCOUNTER — Other Ambulatory Visit: Payer: Self-pay

## 2020-02-22 VITALS — BP 143/91 | HR 73 | Temp 97.6°F | Resp 16 | Ht 60.0 in | Wt 236.1 lb

## 2020-02-22 DIAGNOSIS — Z114 Encounter for screening for human immunodeficiency virus [HIV]: Secondary | ICD-10-CM | POA: Diagnosis not present

## 2020-02-22 DIAGNOSIS — I10 Essential (primary) hypertension: Secondary | ICD-10-CM

## 2020-02-22 DIAGNOSIS — Z1159 Encounter for screening for other viral diseases: Secondary | ICD-10-CM

## 2020-02-22 DIAGNOSIS — Z23 Encounter for immunization: Secondary | ICD-10-CM | POA: Diagnosis not present

## 2020-02-22 DIAGNOSIS — Z0000 Encounter for general adult medical examination without abnormal findings: Secondary | ICD-10-CM | POA: Diagnosis not present

## 2020-02-22 DIAGNOSIS — Z1231 Encounter for screening mammogram for malignant neoplasm of breast: Secondary | ICD-10-CM

## 2020-02-22 DIAGNOSIS — Z124 Encounter for screening for malignant neoplasm of cervix: Secondary | ICD-10-CM

## 2020-02-22 MED ORDER — LOSARTAN POTASSIUM 50 MG PO TABS
50.0000 mg | ORAL_TABLET | Freq: Every day | ORAL | 1 refills | Status: DC
Start: 2020-02-22 — End: 2020-03-19

## 2020-02-22 MED ORDER — ATORVASTATIN CALCIUM 20 MG PO TABS
20.0000 mg | ORAL_TABLET | Freq: Every day | ORAL | 1 refills | Status: DC
Start: 2020-02-22 — End: 2020-03-19

## 2020-02-22 NOTE — Progress Notes (Signed)
Pre visit review using our clinic review tool, if applicable. No additional management support is needed unless otherwise documented below in the visit note. 

## 2020-02-22 NOTE — Progress Notes (Signed)
Subjective:    Patient ID: Kaitlyn Whitney, female    DOB: 1966/03/28, 54 y.o.   MRN: [id number]  DOS:  02/22/2020 Type of visit - description: CPX No new concerns. Ongoing neuralgia paresthetica symptoms. Ongoing upper GI symptoms   Review of Systems Denies chest pain or difficulty breathing No blood in the stool  Other than above, a 14 point review of systems is negative     Past Medical History:  Diagnosis Date   Anxiety    Arthritis    hands   History of kidney stones 2016   Hypertension    Obesity    Restless leg syndrome    Sleep apnea    c-pap    Past Surgical History:  Procedure Laterality Date   bone spur removal     left side of lumbar spine   BREAST SURGERY  2000   breast reduccion   HYSTERECTOMY ABDOMINAL WITH SALPINGECTOMY Bilateral 08/18/2017   Procedure: SUPRACERVICAL HYSTERECTOMY ABDOMINAL;  Surgeon: [name], MD;  Location: [facility];  Service: Gynecology;  Laterality: Bilateral;   LAPAROSCOPIC GASTRIC BANDING  ~ 2010   LYSIS OF ADHESION N/A 08/18/2017   Procedure: LYSIS OF ADHESION;  Surgeon: [name], MD;  Location: [facility];  Service: Gynecology;  Laterality: N/A;   SALPINGOOPHORECTOMY Bilateral 08/18/2017   Procedure: SALPINGO OOPHORECTOMY;  Surgeon: [name], MD;  Location: [facility];  Service: Gynecology;  Laterality: Bilateral;   TUBAL LIGATION  2005    Allergies as of 02/22/2020      Reactions   Penicillins Other (See Comments)   Yeast infection Has patient had a PCN reaction causing immediate rash, facial/tongue/throat swelling, SOB or lightheadedness with hypotension: No Has patient had a PCN reaction causing severe rash involving mucus membranes or skin necrosis: No Has patient had a PCN reaction that required hospitalization: Unknown Has patient had a PCN reaction occurring within the last 10 years: No If all of the above answers are "NO", then may proceed with Cephalosporin use.      Medication List        Accurate as of February 22, 2020 11:59 PM. If you have any questions, ask your nurse or doctor.        STOP taking these medications   butalbital-acetaminophen-caffeine 50-325-40 MG tablet Commonly known as: FIORICET Stopped by: Kathlene November, MD     TAKE these medications   amLODipine 10 MG tablet Commonly known as: NORVASC Take 10 mg by mouth daily.   atorvastatin 20 MG tablet Commonly known as: LIPITOR Take 1 tablet (20 mg total) by mouth at bedtime. Started by: Kathlene November, MD   losartan 50 MG tablet Commonly known as: COZAAR Take 1 tablet (50 mg total) by mouth daily. Started by: Kathlene November, MD   meclizine 25 MG tablet Commonly known as: ANTIVERT Take 1 tablet (25 mg total) by mouth 3 (three) times daily as needed for dizziness.   ondansetron 8 MG tablet Commonly known as: Zofran Take 1 tablet (8 mg total) by mouth every 8 (eight) hours as needed for nausea or vomiting.   pantoprazole 40 MG tablet Commonly known as: PROTONIX Take 1 tablet (40 mg total) by mouth daily before breakfast.   zolpidem 10 MG tablet Commonly known as: AMBIEN TAKE 1 TABLET (10 MG TOTAL) BY MOUTH NIGHTLY AS NEEDED FOR UP TO 30 DAYS FOR SLEEP.          Objective:   Physical Exam BP (!) 143/91 (BP Location: Left  Arm, Patient Position: Sitting, Cuff Size: Normal)    Pulse 73    Temp 97.6 F (36.4 C) (Oral)    Resp 16    Ht 5' (1.524 m)    Wt 236 lb 2 oz (107.1 kg)    LMP 12/20/2016    SpO2 96%    BMI 46.12 kg/m  General:   Well developed, NAD, BMI noted.  HEENT:  Normocephalic . Face symmetric, atraumatic Lungs:  CTA B Normal respiratory effort, no intercostal retractions, no accessory muscle use. Heart: RRR,  no murmur.  Abdomen:  Not distended, soft, non-tender. No rebound or rigidity.   Skin: Not pale. Not jaundice Lower extremities: no pretibial edema bilaterally  Neurologic:  alert & oriented X3.  Speech normal, gait appropriate for age and unassisted Psych--  Cognition and  judgment appear intact.  Cooperative with normal attention span and concentration.  Behavior appropriate. No anxious or depressed appearing.     Assessment     Assessment (new patient 10/2019 referred by her husband) HTN OSA (per [facility]), on Cpap Morbid obesity, bariatric surgery 2010 GERD H/o meralgia paresthetica  01/2018: Nerve conduction studies done on both lower extremities shows findings consistent with a left lateral femoral cutaneous neuropathy.  Sensory abnormalities reported by the patient in the left leg correlate well with this finding.  EMG evaluation of the left lower extremity is within normal limits, no evidence of an overlying lumbosacral radiculopathy is seen.   PLAN HTN: BP slightly elevated today, no recent ambulatory BP recorded as requested, last EKG 2019 normal. Plan: Add losartan 50 mg daily.  BMP in 2 weeks.  Monitor BPs. Dyslipidemia: Last LDL 119, at the time I recommended healthy diet however CV RF 10 years is 16.8.  Recommend atorvastatin 20 mg daily. Morbid obesity: Encouraged to consider be seen at the wellness clinic Neuralgia paresthetica: Ongoing issue, recommend observation GERD, dysphagia: Saw GI, they are planning on EGD RTC labs 2 weeks: CMP, hep C, HIV RTC office visit 3 months   This visit occurred during the SARS-CoV-2 public health emergency.  Safety protocols were in place, including screening questions prior to the visit, additional usage of staff PPE, and extensive cleaning of exam room while observing appropriate contact time as indicated for disinfecting solutions.

## 2020-02-22 NOTE — Patient Instructions (Addendum)
Recommend to start losartan for blood pressure  Start atorvastatin for high cholesterol  Consider see the wellness clinic  Check the  blood pressure 2 or 3 times a week BP GOAL is between 110/65 and  135/85. If it is consistently higher or lower, let me know  For meralgia paresthetica try over-the-counter capsaicin  GO TO THE FRONT DESK, [place] Come back for   blood work only in 2 weeks  Come back for a checkup with me in 3 months     Tdap (Tetanus, Diphtheria, Pertussis) Vaccine: What You Need to Know 1. Why get vaccinated? Tdap vaccine can prevent tetanus, diphtheria, and pertussis. Diphtheria and pertussis spread from person to person. Tetanus enters the body through cuts or wounds.  TETANUS (T) causes painful stiffening of the muscles. Tetanus can lead to serious health problems, including being unable to open the mouth, having trouble swallowing and breathing, or death.  DIPHTHERIA (D) can lead to difficulty breathing, heart failure, paralysis, or death.  PERTUSSIS (aP), also known as "whooping cough," can cause uncontrollable, violent coughing which makes it hard to breathe, eat, or drink. Pertussis can be extremely serious in babies and young children, causing pneumonia, convulsions, brain damage, or death. In teens and adults, it can cause weight loss, loss of bladder control, passing out, and rib fractures from severe coughing. 2. Tdap vaccine Tdap is only for children 7 years and older, adolescents, and adults.  Adolescents should receive a single dose of Tdap, preferably at age 14 or 70 years. Pregnant women should get a dose of Tdap during every pregnancy, to protect the newborn from pertussis. Infants are most at risk for severe, life-threatening complications from pertussis. Adults who have never received Tdap should get a dose of Tdap. Also, adults should receive a booster dose every 10 years, or earlier in the case of a severe and dirty  wound or burn. Booster doses can be either Tdap or Td (a different vaccine that protects against tetanus and diphtheria but not pertussis). Tdap may be given at the same time as other vaccines. 3. Talk with your health care provider Tell your vaccine provider if the person getting the vaccine:  Has had an allergic reaction after a previous dose of any vaccine that protects against tetanus, diphtheria, or pertussis, or has any severe, life-threatening allergies.  Has had a coma, decreased level of consciousness, or prolonged seizures within 7 days after a previous dose of any pertussis vaccine (DTP, DTaP, or Tdap).  Has seizures or another nervous system problem.  Has ever had Guillain-Barr Syndrome (also called GBS).  Has had severe pain or swelling after a previous dose of any vaccine that protects against tetanus or diphtheria. In some cases, your health care provider may decide to postpone Tdap vaccination to a future visit.  People with minor illnesses, such as a cold, may be vaccinated. People who are moderately or severely ill should usually wait until they recover before getting Tdap vaccine.  Your health care provider can give you more information. 4. Risks of a vaccine reaction  Pain, redness, or swelling where the shot was given, mild fever, headache, feeling tired, and nausea, vomiting, diarrhea, or stomachache sometimes happen after Tdap vaccine. People sometimes faint after medical procedures, including vaccination. Tell your provider if you feel dizzy or have vision changes or ringing in the ears.  As with any medicine, there is a very remote chance of a vaccine causing a severe allergic reaction, other serious injury,  or death. 5. What if there is a serious problem? An allergic reaction could occur after the vaccinated person leaves the clinic. If you see signs of a severe allergic reaction (hives, swelling of the face and throat, difficulty breathing, a fast heartbeat,  dizziness, or weakness), call 9-1-1 and get the person to the nearest hospital. For other signs that concern you, call your health care provider.  Adverse reactions should be reported to the Vaccine Adverse Event Reporting System (VAERS). Your health care provider will usually file this report, or you can do it yourself. Visit the VAERS website at www.vaers.SamedayNews.es or call 986-759-0126. VAERS is only for reporting reactions, and VAERS staff do not give medical advice. 6. The National Vaccine Injury Compensation Program The [employer] Vaccine Injury Compensation Program (VICP) is a federal program that was created to compensate people who may have been injured by certain vaccines. Visit the VICP website at GoldCloset.com.ee or call 5626354727 to learn about the program and about filing a claim. There is a time limit to file a claim for compensation. 7. How can I learn more?  Ask your health care provider.  Call your local or state health department.  Contact the Centers for Disease Control and Prevention (CDC): ? Call 930-675-8689 (1-800-CDC-INFO) or ? Visit CDC's website at http://hunter.com/ Vaccine Information Statement Tdap (Tetanus, Diphtheria, Pertussis) Vaccine (09/15/2018) This information is not intended to replace advice given to you by your health care provider. Make sure you discuss any questions you have with your health care provider. Document Revised: 09/24/2018 Document Reviewed: 09/27/2018 Elsevier Patient Education  [employer].

## 2020-02-23 ENCOUNTER — Encounter: Payer: Self-pay | Admitting: Internal Medicine

## 2020-02-23 DIAGNOSIS — Z0000 Encounter for general adult medical examination without abnormal findings: Secondary | ICD-10-CM | POA: Insufficient documentation

## 2020-02-23 DIAGNOSIS — Z1211 Encounter for screening for malignant neoplasm of colon: Secondary | ICD-10-CM | POA: Insufficient documentation

## 2020-02-23 NOTE — Assessment & Plan Note (Addendum)
HTN: BP slightly elevated today, no recent ambulatory BP recorded as requested, last EKG 2019 normal. Plan: Add losartan 50 mg daily.  BMP in 2 weeks.  Monitor BPs. Dyslipidemia: Last LDL 119, at the time I recommended healthy diet however CV RF 10 years is 16.8.  Recommend atorvastatin 20 mg daily. Morbid obesity: Encouraged to consider be seen at the wellness clinic Neuralgia paresthetica: Ongoing issue, recommend observation GERD, dysphagia: Saw GI, they are planning on EGD RTC labs 2 weeks: CMP, hep C, HIV RTC office visit 3 months

## 2020-02-23 NOTE — Assessment & Plan Note (Signed)
-   Tdap: today - C-19 vaccine hesitancy extensively discussed.  Will think about it. - flu shot rec q year, states won't do it d/t previous s/e  -CCS:Had a colonoscopy with Dr. Deatra Ina 2009, multiple polyps.  Recently saw GI  they are planning a colonoscopy in the future. -Female care: Had a hysterectomy, B oophorectomy; h/o abnormal PAP in her teens, s/p conization, refer to gyn Mammogram: referral   -Labs: CMP, hep C, HIV. -Diet exercise: Discussed

## 2020-02-24 ENCOUNTER — Ambulatory Visit (INDEPENDENT_AMBULATORY_CARE_PROVIDER_SITE_OTHER): Payer: BC Managed Care – PPO

## 2020-02-24 ENCOUNTER — Other Ambulatory Visit: Payer: Self-pay | Admitting: Gastroenterology

## 2020-02-24 DIAGNOSIS — Z1159 Encounter for screening for other viral diseases: Secondary | ICD-10-CM | POA: Diagnosis not present

## 2020-02-24 LAB — SARS CORONAVIRUS 2 (TAT 6-24 HRS): SARS Coronavirus 2: NEGATIVE

## 2020-02-27 ENCOUNTER — Other Ambulatory Visit: Payer: Self-pay

## 2020-02-27 ENCOUNTER — Ambulatory Visit (AMBULATORY_SURGERY_CENTER): Payer: BC Managed Care – PPO | Admitting: Gastroenterology

## 2020-02-27 ENCOUNTER — Encounter: Payer: Self-pay | Admitting: Gastroenterology

## 2020-02-27 VITALS — BP 133/79 | HR 70 | Temp 96.9°F | Resp 22 | Ht 60.0 in | Wt 239.0 lb

## 2020-02-27 DIAGNOSIS — R1310 Dysphagia, unspecified: Secondary | ICD-10-CM

## 2020-02-27 DIAGNOSIS — K219 Gastro-esophageal reflux disease without esophagitis: Secondary | ICD-10-CM | POA: Diagnosis not present

## 2020-02-27 DIAGNOSIS — R1319 Other dysphagia: Secondary | ICD-10-CM

## 2020-02-27 MED ORDER — PANTOPRAZOLE SODIUM 40 MG PO TBEC: 40.0000 mg | DELAYED_RELEASE_TABLET | Freq: Two times a day (BID) | ORAL | 2 refills | Status: DC

## 2020-02-27 MED ORDER — SODIUM CHLORIDE 0.9 % IV SOLN: 500.0000 mL | Freq: Once | INTRAVENOUS | Status: DC

## 2020-02-27 NOTE — Progress Notes (Signed)
PT taken to PACU. Monitors in place. VSS. Report given to RN. 

## 2020-02-27 NOTE — Progress Notes (Signed)
VS taken by S.B. 

## 2020-02-27 NOTE — Patient Instructions (Signed)
Increase Protonix to 40mg  twice a day, new prescription sent Barium swallow study to be scheduled    YOU HAD AN ENDOSCOPIC PROCEDURE TODAY AT [facility]:   Refer to the procedure report that was given to you for any specific questions about what was found during the examination.  If the procedure report does not answer your questions, please call your gastroenterologist to clarify.  If you requested that your care partner not be given the details of your procedure findings, then the procedure report has been included in a sealed envelope for you to review at your convenience later.  YOU SHOULD EXPECT: Some feelings of bloating in the abdomen. Passage of more gas than usual.  Walking can help get rid of the air that was put into your GI tract during the procedure and reduce the bloating. If you had a lower endoscopy (such as a colonoscopy or flexible sigmoidoscopy) you may notice spotting of blood in your stool or on the toilet paper. If you underwent a bowel prep for your procedure, you may not have a normal bowel movement for a few days.  Please Note:  You might notice some irritation and congestion in your nose or some drainage.  This is from the oxygen used during your procedure.  There is no need for concern and it should clear up in a day or so.  SYMPTOMS TO REPORT IMMEDIATELY:   Following upper endoscopy (EGD)  Vomiting of blood or coffee ground material  New chest pain or pain under the shoulder blades  Painful or persistently difficult swallowing  New shortness of breath  Fever of 100F or higher  Black, tarry-looking stools  For urgent or emergent issues, a gastroenterologist can be reached at any hour by calling [phone]. Do not use MyChart messaging for urgent concerns.    DIET:  We do recommend a small meal at first, but then you may proceed to your regular diet.  Drink plenty of fluids but you should avoid alcoholic beverages for 24 hours.  ACTIVITY:   You should plan to take it easy for the rest of today and you should NOT DRIVE or use heavy machinery until tomorrow (because of the sedation medicines used during the test).    FOLLOW UP: Our staff will call the number listed on your records 48-72 hours following your procedure to check on you and address any questions or concerns that you may have regarding the information given to you following your procedure. If we do not reach you, we will leave a message.  We will attempt to reach you two times.  During this call, we will ask if you have developed any symptoms of COVID 19. If you develop any symptoms (ie: fever, flu-like symptoms, shortness of breath, cough etc.) before then, please call [phone].  If you test positive for Covid 19 in the 2 weeks post procedure, please call and report this information to [place].    SIGNATURES/CONFIDENTIALITY: You and/or your care partner have signed paperwork which will be entered into your electronic medical record.  These signatures attest to the fact that that the information above on your After Visit Summary has been reviewed and is understood.  Full responsibility of the confidentiality of this discharge information lies with you and/or your care-partner.

## 2020-02-27 NOTE — Op Note (Signed)
[facility] Patient Name: Kaitlyn Whitney Procedure Date: 02/27/2020 8:36 AM MRN: [id number] Endoscopist: [place]. Loletha Carrow , MD Age: 54 Referring MD:  Date of Birth: [birth year] Gender: Female Account #: [id number] Procedure:                Upper GI endoscopy Indications:              Esophageal dysphagia, Esophageal reflux symptoms                            that persist despite appropriate therapy Medicines:                Monitored Anesthesia Care Procedure:                Pre-Anesthesia Assessment:                           - Prior to the procedure, a History and Physical                            was performed, and patient medications and                            allergies were reviewed. The patient's tolerance of                            previous anesthesia was also reviewed. The risks                            and benefits of the procedure and the sedation                            options and risks were discussed with the patient.                            All questions were answered, and informed consent                            was obtained. Prior Anticoagulants: The patient has                            taken no previous anticoagulant or antiplatelet                            agents. ASA Grade Assessment: III - A patient with                            severe systemic disease. After reviewing the risks                            and benefits, the patient was deemed in                            satisfactory condition to undergo the procedure.  After obtaining informed consent, the endoscope was                            passed under direct vision. Throughout the                            procedure, the patient's blood pressure, pulse, and                            oxygen saturations were monitored continuously. The                            Endoscope was introduced through the mouth, and                            advanced to the  second part of duodenum. The upper                            GI endoscopy was somewhat difficult due to patient                            coughing. The patient tolerated the procedure. Scope In: Scope Out: Findings:                 The lumen of the esophagus was mildly dilated.                           LA Grade B (one or more mucosal breaks greater than                            5 mm, not extending between the tops of two mucosal                            folds) esophagitis with no bleeding was found in                            the distal esophagus.                           Extrinsic compression on the stomach was found in                            the cardia (LapBand - seen best on retroflexion).                           The exam of the stomach was otherwise normal.                           The cardia and gastric fundus were normal on                            retroflexion (except LapBand effect as noted above).  The examined duodenum was normal. Complications:            No immediate complications. Estimated Blood Loss:     Estimated blood loss: none. Impression:               - Dilation in the entire esophagus of unclear                            significance.                           - LA Grade B reflux esophagitis with no bleeding.                           - Extrinsic compression in the cardia from LapBand.                           - Normal examined duodenum.                           - No specimens collected. Recommendation:           - Patient has a contact number available for                            emergencies. The signs and symptoms of potential                            delayed complications were discussed with the                            patient. Return to normal activities tomorrow.                            Written discharge instructions were provided to the                            patient.                           -  Resume previous diet.                           - Increase protonix to 40 mg twice daily before                            breakfast and supper.                           (New Rx: Disp#60, RF 2 )                           - Perform a barium swallow using barium in liquid                            and tablet form at appointment to be scheduled.  Considering possibility of esophageal motility                            disorder, perhaps as a result of LapBand.                           - Stop smoking. Kaitlyn Whitney L. Loletha Carrow, MD 02/27/2020 9:04:22 AM This report has been signed electronically.

## 2020-02-28 ENCOUNTER — Telehealth: Payer: Self-pay

## 2020-02-28 ENCOUNTER — Other Ambulatory Visit: Payer: Self-pay

## 2020-02-28 DIAGNOSIS — R1319 Other dysphagia: Secondary | ICD-10-CM

## 2020-02-28 NOTE — Telephone Encounter (Signed)
Per 02/27/20 procedure note - patient to be scheduled for barium swallow study Patient is scheduled at [facility] on Friday, 03/09/20 at 11 am, with a 10:45 am arrival time. NPO 3 hours prior. Spoke with patient, she is aware of appointment, will send My Chart message as well.

## 2020-02-29 ENCOUNTER — Ambulatory Visit (HOSPITAL_BASED_OUTPATIENT_CLINIC_OR_DEPARTMENT_OTHER): Payer: BC Managed Care – PPO

## 2020-02-29 ENCOUNTER — Telehealth: Payer: Self-pay | Admitting: *Deleted

## 2020-02-29 NOTE — Telephone Encounter (Signed)
No answer for post procedure call back. Left message for patient to call with questions or concerns. 

## 2020-02-29 NOTE — Telephone Encounter (Signed)
Message left

## 2020-03-05 ENCOUNTER — Ambulatory Visit (HOSPITAL_BASED_OUTPATIENT_CLINIC_OR_DEPARTMENT_OTHER): Payer: BC Managed Care – PPO

## 2020-03-07 ENCOUNTER — Other Ambulatory Visit: Payer: BC Managed Care – PPO

## 2020-03-09 ENCOUNTER — Other Ambulatory Visit: Payer: Self-pay

## 2020-03-09 ENCOUNTER — Ambulatory Visit (HOSPITAL_COMMUNITY)
Admission: RE | Admit: 2020-03-09 | Discharge: 2020-03-09 | Disposition: A | Discharge status: Home / Self Care | Payer: BC Managed Care – PPO | Source: Ambulatory Visit | Attending: Gastroenterology | Admitting: Gastroenterology

## 2020-03-09 DIAGNOSIS — R1310 Dysphagia, unspecified: Secondary | ICD-10-CM | POA: Insufficient documentation

## 2020-03-09 DIAGNOSIS — R1319 Other dysphagia: Secondary | ICD-10-CM

## 2020-03-14 ENCOUNTER — Telehealth: Payer: Self-pay | Admitting: Internal Medicine

## 2020-03-14 NOTE — Telephone Encounter (Signed)
lvm to schedule appt.  

## 2020-03-14 NOTE — Telephone Encounter (Signed)
-----   Message from [place], [place] sent at 03/13/2020  9:21 AM EDT ----- Regarding: Lab appt Pt missed her lab appt on 03/07/20, can you reach out to her to reschedule please?    Thank you,  [facility]

## 2020-03-19 ENCOUNTER — Other Ambulatory Visit: Payer: Self-pay | Admitting: Internal Medicine

## 2020-03-21 ENCOUNTER — Other Ambulatory Visit: Payer: Self-pay | Admitting: Internal Medicine

## 2020-04-02 ENCOUNTER — Encounter: Payer: Self-pay | Admitting: Internal Medicine

## 2020-04-02 ENCOUNTER — Other Ambulatory Visit: Payer: Self-pay | Admitting: Internal Medicine

## 2020-04-27 ENCOUNTER — Encounter: Payer: Self-pay | Admitting: Internal Medicine

## 2020-05-02 DIAGNOSIS — Z09 Encounter for follow-up examination after completed treatment for conditions other than malignant neoplasm: Secondary | ICD-10-CM | POA: Diagnosis not present

## 2020-05-02 DIAGNOSIS — Z4651 Encounter for fitting and adjustment of gastric lap band: Secondary | ICD-10-CM | POA: Diagnosis not present

## 2020-05-03 ENCOUNTER — Emergency Department (HOSPITAL_COMMUNITY)
Admission: EM | Admit: 2020-05-03 | Discharge: 2020-05-03 | Disposition: A | Discharge status: Home / Self Care | Payer: 59 | Attending: Emergency Medicine | Admitting: Emergency Medicine

## 2020-05-03 ENCOUNTER — Emergency Department (HOSPITAL_COMMUNITY): Payer: 59

## 2020-05-03 ENCOUNTER — Encounter (HOSPITAL_COMMUNITY): Payer: Self-pay

## 2020-05-03 DIAGNOSIS — I129 Hypertensive chronic kidney disease with stage 1 through stage 4 chronic kidney disease, or unspecified chronic kidney disease: Secondary | ICD-10-CM | POA: Insufficient documentation

## 2020-05-03 DIAGNOSIS — F17210 Nicotine dependence, cigarettes, uncomplicated: Secondary | ICD-10-CM | POA: Diagnosis not present

## 2020-05-03 DIAGNOSIS — N189 Chronic kidney disease, unspecified: Secondary | ICD-10-CM | POA: Diagnosis not present

## 2020-05-03 DIAGNOSIS — Z79899 Other long term (current) drug therapy: Secondary | ICD-10-CM | POA: Insufficient documentation

## 2020-05-03 DIAGNOSIS — E876 Hypokalemia: Secondary | ICD-10-CM | POA: Diagnosis not present

## 2020-05-03 DIAGNOSIS — K219 Gastro-esophageal reflux disease without esophagitis: Secondary | ICD-10-CM | POA: Insufficient documentation

## 2020-05-03 DIAGNOSIS — R1013 Epigastric pain: Secondary | ICD-10-CM | POA: Diagnosis not present

## 2020-05-03 DIAGNOSIS — R079 Chest pain, unspecified: Secondary | ICD-10-CM | POA: Diagnosis not present

## 2020-05-03 LAB — CBC
HCT: 41.7 % (ref 36.0–46.0)
Hemoglobin: 13.8 g/dL (ref 12.0–15.0)
MCH: 29.9 pg (ref 26.0–34.0)
MCHC: 33.1 g/dL (ref 30.0–36.0)
MCV: 90.5 fL (ref 80.0–100.0)
Platelets: 305 10*3/uL (ref 150–400)
RBC: 4.61 MIL/uL (ref 3.87–5.11)
RDW: 13.8 % (ref 11.5–15.5)
WBC: 9.6 10*3/uL (ref 4.0–10.5)
nRBC: 0 % (ref 0.0–0.2)

## 2020-05-03 LAB — BASIC METABOLIC PANEL
Anion gap: 10 (ref 5–15)
BUN: 14 mg/dL (ref 6–20)
CO2: 22 mmol/L (ref 22–32)
Calcium: 8.8 mg/dL — ABNORMAL LOW (ref 8.9–10.3)
Chloride: 104 mmol/L (ref 98–111)
Creatinine, Ser: 0.8 mg/dL (ref 0.44–1.00)
GFR, Estimated: >60 mL/min (ref >60)
Glucose, Bld: 88 mg/dL (ref 70–99)
Potassium: 3 mmol/L — ABNORMAL LOW (ref 3.5–5.1)
Sodium: 136 mmol/L (ref 135–145)

## 2020-05-03 LAB — TROPONIN I (HIGH SENSITIVITY)
Troponin I (High Sensitivity): 5 ng/L (ref <18)
Troponin I (High Sensitivity): <2 ng/L (ref <18)

## 2020-05-03 LAB — I-STAT BETA HCG BLOOD, ED (MC, WL, AP ONLY): I-stat hCG, quantitative: <5 m[IU]/mL (ref <5)

## 2020-05-03 MED ORDER — SUCRALFATE 1 GM/10ML PO SUSP: 1.0000 g | Freq: Three times a day (TID) | ORAL | 0 refills | Status: DC

## 2020-05-03 MED ORDER — POTASSIUM CHLORIDE CRYS ER 20 MEQ PO TBCR: 20.0000 meq | EXTENDED_RELEASE_TABLET | Freq: Two times a day (BID) | ORAL | 0 refills | Status: DC

## 2020-05-03 MED ORDER — ALUM & MAG HYDROXIDE-SIMETH 200-200-20 MG/5ML PO SUSP
30.0000 mL | Freq: Once | ORAL | Status: AC
  Administered 2020-05-03: 30 mL via ORAL
  Filled 2020-05-03: qty 30

## 2020-05-03 NOTE — ED Provider Notes (Signed)
[facility] DEPT Provider Note   CSN: [id number] Arrival date & time: 05/03/20  7341     History No chief complaint on file.   Kaitlyn Whitney is a 54 y.o. female.  The history is provided by the patient.  Chest Pain Pain location:  Epigastric and substernal area Pain quality: pressure   Pain radiates to:  Does not radiate Pain severity:  Moderate Onset quality:  Gradual Duration:  1 day Timing:  Constant Progression:  Worsening Chronicity:  New Context comment:  Was having symptoms and CCS took all the fluid from the balloon associated with her lapband, she came home and ate 2 bowls of hash and and symptoms then got worse.   Relieved by:  Nothing Worsened by:  Nothing Ineffective treatments:  None tried Associated symptoms: no abdominal pain, no altered mental status, no anorexia, no back pain, no cough, no diaphoresis, no palpitations, no PND, no shortness of breath, no syncope, no vomiting and no weakness   Risk factors: no aortic disease and not female        Past Medical History:  Diagnosis Date  . Anxiety   . Arthritis    hands  . Chronic kidney disease    kidney stones  . GERD (gastroesophageal reflux disease)   . History of kidney stones 2016  . Hyperlipidemia   . Hypertension   . Obesity   . Restless leg syndrome   . Sleep apnea    c-pap    Patient Active Problem List   Diagnosis Date Noted  . Annual physical exam 02/23/2020  . PCP NOTES >>>>>>>>>>>>>> 10/26/2019  . Neuralgia of groin 11/30/2017  . Neuropathy 09/23/2017  . Class 3 severe obesity with body mass index (BMI) of 45.0 to 49.9 in adult ([place]) 07/22/2017  . Other insomnia 07/22/2017  . Essential hypertension 02/27/2015  . History of laparoscopic adjustable gastric banding, APL.  10/23/2008. 09/02/2012  . MERALGIA PARESTHETICA 03/07/2008  . TOBACCO USE DISORDER/SMOKER-SMOKING CESSATION DISCUSSED 09/29/2007  . DEPRESSION 07/01/2007  . OBSTRUCTIVE SLEEP APNEA  07/01/2007    Past Surgical History:  Procedure Laterality Date  . bone spur removal     left side of lumbar spine  . BREAST SURGERY  2000   breast reduccion  . COLONOSCOPY    . HYSTERECTOMY ABDOMINAL WITH SALPINGECTOMY Bilateral 08/18/2017   Procedure: SUPRACERVICAL HYSTERECTOMY ABDOMINAL;  Surgeon: [name], MD;  Location: [facility];  Service: Gynecology;  Laterality: Bilateral;  . LAPAROSCOPIC GASTRIC BANDING  ~ 2010  . LYSIS OF ADHESION N/A 08/18/2017   Procedure: LYSIS OF ADHESION;  Surgeon: [name], MD;  Location: [facility];  Service: Gynecology;  Laterality: N/A;  . SALPINGOOPHORECTOMY Bilateral 08/18/2017   Procedure: SALPINGO OOPHORECTOMY;  Surgeon: [name], MD;  Location: [facility];  Service: Gynecology;  Laterality: Bilateral;  . TUBAL LIGATION  2005     OB History    Gravida  0   Para      Term      Preterm      AB      Living        SAB      TAB      Ectopic      Multiple      Live Births              Family History  Problem Relation Age of Onset  . Asthma Mother   . Drug abuse Father   . HIV Father   .  Drug abuse Brother   . Diabetes Brother   . Drug abuse Brother   . Heart attack Brother   . Stroke Brother   . Asthma Other   . Hypertension Other   . Colon cancer Neg Hx   . Breast cancer Neg Hx   . Esophageal cancer Neg Hx   . Rectal cancer Neg Hx   . Stomach cancer Neg Hx     Social History   Tobacco Use  . Smoking status: Current Every Day Smoker    Packs/day: 0.50    Years: 30.00    Pack years: 15.00    Types: Cigarettes    Start date: 14  . Smokeless tobacco: Never Used  . Tobacco comment: 10 cig per day  Vaping Use  . Vaping Use: Never used  Substance Use Topics  . Alcohol use: Yes    Comment: rarely  . Drug use: No    Home Medications Prior to Admission medications   Medication Sig Start Date End Date Taking? Authorizing Provider  amLODipine (NORVASC) 10 MG tablet Take 10 mg by mouth daily.     Yes [provider]  atorvastatin (LIPITOR) 20 MG tablet Take 1 tablet (20 mg total) by mouth at bedtime. 03/21/20  Yes Paz, Alda Berthold, MD  losartan (COZAAR) 50 MG tablet Take 1 tablet (50 mg total) by mouth daily. 03/21/20  Yes Paz, Alda Berthold, MD  meclizine (ANTIVERT) 25 MG tablet Take 1 tablet (25 mg total) by mouth 3 (three) times daily as needed for dizziness. 09/19/19  Yes Raylene Everts, MD  ondansetron (ZOFRAN) 8 MG tablet Take 1 tablet (8 mg total) by mouth every 8 (eight) hours as needed for nausea or vomiting. 09/19/19  Yes Raylene Everts, MD  pantoprazole (PROTONIX) 40 MG tablet Take 1 tablet (40 mg total) by mouth 2 (two) times daily. 02/27/20  Yes Danis, Kirke Corin, MD  zolpidem (AMBIEN) 10 MG tablet Take 10 mg by mouth at bedtime as needed for sleep.  06/18/17  Yes [provider]  sucralfate (CARAFATE) 1 GM/10ML suspension Take 10 mLs (1 g total) by mouth 4 (four) times daily -  with meals and at bedtime. 05/03/20   Shanicqua Coldren, MD    Allergies    Penicillins  Review of Systems   Review of Systems  Constitutional: Negative for diaphoresis.  HENT: Negative for congestion.   Eyes: Negative for visual disturbance.  Respiratory: Negative for cough and shortness of breath.   Cardiovascular: Positive for chest pain. Negative for palpitations, syncope and PND.  Gastrointestinal: Negative for abdominal pain, anorexia and vomiting.  Genitourinary: Negative for difficulty urinating.  Musculoskeletal: Negative for back pain.  Skin: Negative for rash.  Neurological: Negative for weakness.  Psychiatric/Behavioral: Negative for agitation.  All other systems reviewed and are negative.   Physical Exam Updated Vital Signs BP (!) 180/115 (BP Location: Left Arm)   Pulse 76   Temp 98 F (36.7 C) (Oral)   Resp 17   Ht 5' (1.524 m)   Wt 102.5 kg   LMP 12/20/2016   SpO2 100%   BMI 44.14 kg/m   Physical Exam Vitals and nursing note reviewed.  Constitutional:       General: She is not in acute distress.    Appearance: Normal appearance.  HENT:     Head: Normocephalic and atraumatic.     Nose: Nose normal.  Eyes:     Conjunctiva/sclera: Conjunctivae normal.     Pupils: Pupils are  equal, round, and reactive to light.  Cardiovascular:     Rate and Rhythm: Normal rate and regular rhythm.     Pulses: Normal pulses.     Heart sounds: Normal heart sounds.  Pulmonary:     Effort: Pulmonary effort is normal.     Breath sounds: Normal breath sounds.  Abdominal:     Tenderness: There is no abdominal tenderness. There is no guarding or rebound.     Comments: Gassy in the epigastric area   Musculoskeletal:        General: Normal range of motion.     Cervical back: Normal range of motion and neck supple.  Skin:    General: Skin is warm and dry.     Capillary Refill: Capillary refill takes less than 2 seconds.  Neurological:     General: No focal deficit present.     Mental Status: She is alert and oriented to person, place, and time.     Deep Tendon Reflexes: Reflexes normal.  Psychiatric:        Mood and Affect: Mood normal.        Behavior: Behavior normal.     ED Results / Procedures / Treatments   Labs (all labs ordered are listed, but only abnormal results are displayed) Labs Reviewed  BASIC METABOLIC PANEL - Abnormal; Notable for the following components:      Result Value   Potassium 3.0 (*)    Calcium 8.8 (*)    All other components within normal limits  CBC  I-STAT BETA HCG BLOOD, ED ([facility], [facility], [facility] ONLY)  TROPONIN I (HIGH SENSITIVITY)  TROPONIN I (HIGH SENSITIVITY)    EKG EKG Interpretation  Date/Time:  Thursday May 03 2020 04:27:23 EST Ventricular Rate:  83 PR Interval:    QRS Duration: 87 QT Interval:  379 QTC Calculation: 446 R Axis:   30 Text Interpretation: Sinus rhythm Baseline wander in lead(s) III Confirmed by Randal Buba, Adilson Grafton ([id number]) on 05/03/2020 4:53:12 AM   Radiology DG Chest 2 View  Result Date:  05/03/2020 CLINICAL DATA:  Chest pain EXAM: CHEST - 2 VIEW COMPARISON:  None. FINDINGS: The heart size and mediastinal contours are within normal limits. Both lungs are clear. The visualized skeletal structures are unremarkable. IMPRESSION: No active cardiopulmonary disease. Electronically Signed   By: Fidela Salisbury MD   On: 05/03/2020 05:55    Procedures Procedures (including critical care time)  Medications Ordered in ED Medications  alum & mag hydroxide-simeth (MAALOX/MYLANTA) 200-200-20 MG/5ML suspension 30 mL (30 mLs Oral Given 05/03/20 0515)    ED Course  I have reviewed the triage vital signs and the nursing notes.  Pertinent labs & imaging results that were available during my care of the patient were reviewed by me and considered in my medical decision making (see chart for details).    I suspect that this is from gerd, esophagitis and some dysphagia which was the reason for removing saline from the balloon of the lapband.  Patient ruled out for MI in the ED.  I have cautioned the patient against overeating.  Will start bland diet and carafate and have the patient follow up with her GI specialist for ongoing care.  Will give RX for potassium.  Strict return precautions given,   Kaitlyn Whitney was evaluated in Emergency Department on 05/03/2020 for the symptoms described in the history of present illness. She was evaluated in the context of the global COVID-19 pandemic, which necessitated consideration that the patient might be at  risk for infection with the SARS-CoV-2 virus that causes COVID-19. Institutional protocols and algorithms that pertain to the evaluation of patients at risk for COVID-19 are in a state of rapid change based on information released by regulatory bodies including the CDC and federal and state organizations. These policies and algorithms were followed during the patient's care in the ED.  Final Clinical Impression(s) / ED Diagnoses Final diagnoses:    Hypokalemia  Gastroesophageal reflux disease, unspecified whether esophagitis present   Return for intractable cough, coughing up blood,fevers >100.4 unrelieved by medication, shortness of breath, intractable vomiting, chest pain, shortness of breath, weakness,numbness, changes in speech, facial asymmetry,abdominal pain, passing out,Inability to tolerate liquids or food, cough, altered mental status or any concerns. No signs of systemic illness or infection. The patient is nontoxic-appearing on exam and vital signs are within normal limits.   I have reviewed the triage vital signs and the nursing notes. Pertinent labs &imaging results that were available during my care of the patient were reviewed by me and considered in my medical decision making (see chart for details).After history, exam, and medical workup I feel the patient has beenappropriately medically screened and is safe for discharge home. Pertinent diagnoses were discussed with the patient. Patient was given return precautions.   Rx / DC Orders ED Discharge Orders         Ordered    sucralfate (CARAFATE) 1 GM/10ML suspension  3 times daily with meals & bedtime        05/03/20 Fellows, Kolston Lacount, MD 05/03/20 [id number]

## 2020-05-03 NOTE — ED Triage Notes (Signed)
Pt complains of chest tightness that woke her up this am about 3am Pt had lapband surgery here 10 years ago, it has been restricted and the saline was released from the band yesterday, she said that her chest felt a little heavy but didn't hurt when she got home but the pain did wake her up

## 2020-05-03 NOTE — ED Notes (Signed)
Pt states sternal chest pain woke her up out of her sleep. Pt states pain radiates to her back

## 2020-05-04 ENCOUNTER — Telehealth: Payer: Self-pay | Admitting: Gastroenterology

## 2020-05-04 NOTE — Telephone Encounter (Signed)
This patient had an upper endoscopy and subsequent barium swallow in late September for dysphagia that was occurring because of her LAP-BAND.  I received a note from Dr. Lucia Gaskins at [facility], who saw her 05/02/2020 and deflated the LAP-BAND device.  The note indicated that she will be following up with Dr. Greer Pickerel in 2 to 3 months (after Dr. Pollie Friar upcoming retirement), and they are considering partially reinflating the LAP-BAND at that time.  My advice is that the LAP-BAND should remain as it is now without any fluid in it, at least until I see her in follow-up.  Please arrange a clinic visit with me in early to mid January.

## 2020-05-06 ENCOUNTER — Emergency Department (HOSPITAL_COMMUNITY): Payer: 59

## 2020-05-06 ENCOUNTER — Other Ambulatory Visit: Payer: Self-pay

## 2020-05-06 ENCOUNTER — Emergency Department (HOSPITAL_COMMUNITY)
Admission: EM | Admit: 2020-05-06 | Discharge: 2020-05-06 | Disposition: A | Discharge status: Home / Self Care | Payer: 59 | Attending: Emergency Medicine | Admitting: Emergency Medicine

## 2020-05-06 ENCOUNTER — Encounter (HOSPITAL_COMMUNITY): Payer: Self-pay | Admitting: Emergency Medicine

## 2020-05-06 DIAGNOSIS — R0789 Other chest pain: Secondary | ICD-10-CM | POA: Diagnosis not present

## 2020-05-06 DIAGNOSIS — N189 Chronic kidney disease, unspecified: Secondary | ICD-10-CM | POA: Diagnosis not present

## 2020-05-06 DIAGNOSIS — R072 Precordial pain: Secondary | ICD-10-CM

## 2020-05-06 DIAGNOSIS — F17210 Nicotine dependence, cigarettes, uncomplicated: Secondary | ICD-10-CM | POA: Insufficient documentation

## 2020-05-06 DIAGNOSIS — R079 Chest pain, unspecified: Secondary | ICD-10-CM | POA: Diagnosis not present

## 2020-05-06 DIAGNOSIS — Z79899 Other long term (current) drug therapy: Secondary | ICD-10-CM | POA: Diagnosis not present

## 2020-05-06 DIAGNOSIS — I129 Hypertensive chronic kidney disease with stage 1 through stage 4 chronic kidney disease, or unspecified chronic kidney disease: Secondary | ICD-10-CM | POA: Insufficient documentation

## 2020-05-06 DIAGNOSIS — K209 Esophagitis, unspecified without bleeding: Secondary | ICD-10-CM | POA: Insufficient documentation

## 2020-05-06 DIAGNOSIS — J9811 Atelectasis: Secondary | ICD-10-CM | POA: Diagnosis not present

## 2020-05-06 DIAGNOSIS — I160 Hypertensive urgency: Secondary | ICD-10-CM | POA: Diagnosis not present

## 2020-05-06 DIAGNOSIS — R42 Dizziness and giddiness: Secondary | ICD-10-CM | POA: Diagnosis not present

## 2020-05-06 LAB — CBC
HCT: 44.8 % (ref 36.0–46.0)
Hemoglobin: 14.2 g/dL (ref 12.0–15.0)
MCH: 29.8 pg (ref 26.0–34.0)
MCHC: 31.7 g/dL (ref 30.0–36.0)
MCV: 93.9 fL (ref 80.0–100.0)
Platelets: 292 10*3/uL (ref 150–400)
RBC: 4.77 MIL/uL (ref 3.87–5.11)
RDW: 14.3 % (ref 11.5–15.5)
WBC: 7.8 10*3/uL (ref 4.0–10.5)
nRBC: 0 % (ref 0.0–0.2)

## 2020-05-06 LAB — BASIC METABOLIC PANEL
Anion gap: 4 — ABNORMAL LOW (ref 5–15)
BUN: 11 mg/dL (ref 6–20)
CO2: 29 mmol/L (ref 22–32)
Calcium: 8.6 mg/dL — ABNORMAL LOW (ref 8.9–10.3)
Chloride: 108 mmol/L (ref 98–111)
Creatinine, Ser: 0.85 mg/dL (ref 0.44–1.00)
GFR, Estimated: >60 mL/min (ref >60)
Glucose, Bld: 91 mg/dL (ref 70–99)
Potassium: 4.2 mmol/L (ref 3.5–5.1)
Sodium: 141 mmol/L (ref 135–145)

## 2020-05-06 LAB — I-STAT BETA HCG BLOOD, ED (NOT ORDERABLE): I-stat hCG, quantitative: 9 m[IU]/mL — ABNORMAL HIGH (ref <5)

## 2020-05-06 LAB — TROPONIN I (HIGH SENSITIVITY)
Troponin I (High Sensitivity): 3 ng/L (ref <18)
Troponin I (High Sensitivity): 4 ng/L (ref <18)

## 2020-05-06 MED ORDER — SUCRALFATE 1 G PO TABS: 1.0000 g | ORAL_TABLET | Freq: Three times a day (TID) | ORAL | 0 refills | Status: DC

## 2020-05-06 MED ORDER — IOHEXOL 350 MG/ML SOLN
100.0000 mL | Freq: Once | INTRAVENOUS | Status: AC | PRN
  Administered 2020-05-06: 100 mL via INTRAVENOUS

## 2020-05-06 MED ORDER — LOSARTAN POTASSIUM 50 MG PO TABS
50.0000 mg | ORAL_TABLET | Freq: Every day | ORAL | 0 refills | Status: DC
Start: 2020-05-06 — End: 2022-09-15

## 2020-05-06 MED ORDER — METOPROLOL TARTRATE 25 MG PO TABS
25.0000 mg | ORAL_TABLET | Freq: Once | ORAL | Status: AC
  Administered 2020-05-06: 25 mg via ORAL
  Filled 2020-05-06: qty 1

## 2020-05-06 MED ORDER — ASPIRIN 81 MG PO CHEW
324.0000 mg | CHEWABLE_TABLET | Freq: Once | ORAL | Status: AC
  Administered 2020-05-06: 324 mg via ORAL
  Filled 2020-05-06: qty 4

## 2020-05-06 MED ORDER — PANTOPRAZOLE SODIUM 40 MG PO TBEC: 40.0000 mg | DELAYED_RELEASE_TABLET | Freq: Two times a day (BID) | ORAL | 2 refills | Status: DC

## 2020-05-06 NOTE — ED Triage Notes (Signed)
Patient here from home reporting central chest pain, headache, and dizziness that started Wednesday. States that she was seen on 11/18 for samel.

## 2020-05-06 NOTE — Discharge Instructions (Addendum)
We saw you in the ER for the chest pain/shortness of breath. All of our cardiac workup is normal, including labs, EKG and chest X-RAY are normal. We are not sure what is causing your discomfort, but we feel comfortable sending you home at this time. The workup in the ER is not complete, and you should follow up with your primary care doctor for further evaluation.  Please return to the ER if you have worsening chest pain, shortness of breath, pain radiating to your jaw, shoulder, or back, sweats or fainting. Otherwise see the Cardiologist & your primary care doctor as requested.

## 2020-05-06 NOTE — ED Provider Notes (Addendum)
[facility] DEPT Provider Note   CSN: [id number] Arrival date & time: 05/06/20  1242     History Chief Complaint  Patient presents with  . Chest Pain  . Headache  . Hypertension    Kaitlyn Whitney is a 54 y.o. female.  HPI  HPI: A 54 year old patient with a history of hypertension, hypercholesterolemia and obesity presents for evaluation of chest pain. Initial onset of pain was less than one hour ago. The patient's chest pain is described as heaviness/pressure/tightness and is worse with exertion. The patient's chest pain is middle- or left-sided, is not well-localized, is not sharp and does radiate to the arms/jaw/neck. The patient does not complain of nausea and denies diaphoresis. The patient has smoked in the past 90 days. The patient has no history of stroke, has no history of peripheral artery disease, denies any history of treated diabetes and has no relevant family history of coronary artery disease (first degree relative at less than age 44).   Patient's pain started on Thursday.  It is intermittent.  She had saline removed from her lap band on Wednesday.  Pain is not provoked by p.o. intake.  Pain is worse with exertion.  Today patient reports that she had pain even without exertion, which got her concerned.  Patient also noted that her BP is running high.  When she went for preop visit her blood pressure was in the 120s.  Since then, whenever she has checked her blood pressure is running high.  She has not taken her BP medication in 8 days.  Patient smokes about a pack a day right now.  She denies any illicit drug use.  Patient has a mild headache.  She denies any vision change, focal weakness, focal numbness, dizziness.  At the moment she is chest pain and headache free.  Past Medical History:  Diagnosis Date  . Anxiety   . Arthritis    hands  . Chronic kidney disease    kidney stones  . GERD (gastroesophageal reflux disease)   . History  of kidney stones 2016  . Hyperlipidemia   . Hypertension   . Obesity   . Restless leg syndrome   . Sleep apnea    c-pap    Patient Active Problem List   Diagnosis Date Noted  . Annual physical exam 02/23/2020  . PCP NOTES >>>>>>>>>>>>>> 10/26/2019  . Neuralgia of groin 11/30/2017  . Neuropathy 09/23/2017  . Class 3 severe obesity with body mass index (BMI) of 45.0 to 49.9 in adult ([place]) 07/22/2017  . Other insomnia 07/22/2017  . Essential hypertension 02/27/2015  . History of laparoscopic adjustable gastric banding, APL.  10/23/2008. 09/02/2012  . MERALGIA PARESTHETICA 03/07/2008  . TOBACCO USE DISORDER/SMOKER-SMOKING CESSATION DISCUSSED 09/29/2007  . DEPRESSION 07/01/2007  . OBSTRUCTIVE SLEEP APNEA 07/01/2007    Past Surgical History:  Procedure Laterality Date  . bone spur removal     left side of lumbar spine  . BREAST SURGERY  2000   breast reduccion  . COLONOSCOPY    . HYSTERECTOMY ABDOMINAL WITH SALPINGECTOMY Bilateral 08/18/2017   Procedure: SUPRACERVICAL HYSTERECTOMY ABDOMINAL;  Surgeon: [name], MD;  Location: [facility];  Service: Gynecology;  Laterality: Bilateral;  . LAPAROSCOPIC GASTRIC BANDING  ~ 2010  . LYSIS OF ADHESION N/A 08/18/2017   Procedure: LYSIS OF ADHESION;  Surgeon: [name], MD;  Location: [facility];  Service: Gynecology;  Laterality: N/A;  . SALPINGOOPHORECTOMY Bilateral 08/18/2017   Procedure: SALPINGO OOPHORECTOMY;  Surgeon:  [name], MD;  Location: [facility];  Service: Gynecology;  Laterality: Bilateral;  . TUBAL LIGATION  2005     OB History    Gravida  0   Para      Term      Preterm      AB      Living        SAB      TAB      Ectopic      Multiple      Live Births              Family History  Problem Relation Age of Onset  . Asthma Mother   . Drug abuse Father   . HIV Father   . Drug abuse Brother   . Diabetes Brother   . Drug abuse Brother   . Heart attack Brother   . Stroke Brother   . Asthma  Other   . Hypertension Other   . Colon cancer Neg Hx   . Breast cancer Neg Hx   . Esophageal cancer Neg Hx   . Rectal cancer Neg Hx   . Stomach cancer Neg Hx     Social History   Tobacco Use  . Smoking status: Current Every Day Smoker    Packs/day: 0.50    Years: 30.00    Pack years: 15.00    Types: Cigarettes    Start date: 94  . Smokeless tobacco: Never Used  . Tobacco comment: 10 cig per day  Vaping Use  . Vaping Use: Never used  Substance Use Topics  . Alcohol use: Yes    Comment: rarely  . Drug use: No    Home Medications Prior to Admission medications   Medication Sig Start Date End Date Taking? Authorizing Provider  amLODipine (NORVASC) 10 MG tablet Take 10 mg by mouth daily.    Yes [provider]  potassium chloride SA (KLOR-CON) 20 MEQ tablet Take 1 tablet (20 mEq total) by mouth 2 (two) times daily. 05/03/20  Yes Palumbo, April, MD  zolpidem (AMBIEN) 10 MG tablet Take 10 mg by mouth at bedtime as needed for sleep.  06/18/17  Yes [provider]  atorvastatin (LIPITOR) 20 MG tablet Take 1 tablet (20 mg total) by mouth at bedtime. Patient not taking: Reported on 05/06/2020 03/21/20   Colon Branch, MD  losartan (COZAAR) 50 MG tablet Take 1 tablet (50 mg total) by mouth daily. 05/06/20   Varney Biles, MD  meclizine (ANTIVERT) 25 MG tablet Take 1 tablet (25 mg total) by mouth 3 (three) times daily as needed for dizziness. Patient not taking: Reported on 05/06/2020 09/19/19   Raylene Everts, MD  ondansetron (ZOFRAN) 8 MG tablet Take 1 tablet (8 mg total) by mouth every 8 (eight) hours as needed for nausea or vomiting. Patient not taking: Reported on 05/06/2020 09/19/19   Raylene Everts, MD  pantoprazole (PROTONIX) 40 MG tablet Take 1 tablet (40 mg total) by mouth 2 (two) times daily. 05/06/20   Varney Biles, MD  sucralfate (CARAFATE) 1 g tablet Take 1 tablet (1 g total) by mouth 4 (four) times daily -  with meals and at bedtime. 05/06/20    Varney Biles, MD    Allergies    Penicillins  Review of Systems   Review of Systems  Constitutional: Positive for activity change.  Respiratory: Negative for shortness of breath.   Cardiovascular: Positive for chest pain.  Neurological: Positive for headaches.  All  other systems reviewed and are negative.   Physical Exam Updated Vital Signs BP (!) 155/90   Pulse 62   Temp 98 F (36.7 C) (Oral)   Resp (!) 26   Ht 5' (1.524 m)   Wt 102.5 kg   LMP 12/20/2016   SpO2 98%   BMI 44.13 kg/m   Physical Exam Constitutional:      Appearance: She is well-developed.  HENT:     Head: Normocephalic and atraumatic.  Eyes:     Pupils: Pupils are equal, round, and reactive to light.  Cardiovascular:     Rate and Rhythm: Normal rate and regular rhythm.     Pulses:          Radial pulses are 2+ on the right side and 2+ on the left side.     Heart sounds: Normal heart sounds. No murmur heard.   Pulmonary:     Effort: Pulmonary effort is normal. No respiratory distress.  Abdominal:     General: There is no distension.     Palpations: Abdomen is soft.     Tenderness: There is no abdominal tenderness. There is no guarding or rebound.  Musculoskeletal:     Cervical back: Neck supple.  Skin:    General: Skin is warm and dry.  Neurological:     Mental Status: She is alert and oriented to person, place, and time.     ED Results / Procedures / Treatments   Labs (all labs ordered are listed, but only abnormal results are displayed) Labs Reviewed  BASIC METABOLIC PANEL - Abnormal; Notable for the following components:      Result Value   Calcium 8.6 (*)    Anion gap 4 (*)    All other components within normal limits  I-STAT BETA HCG BLOOD, ED (NOT ORDERABLE) - Abnormal; Notable for the following components:   I-stat hCG, quantitative 9.0 (*)    All other components within normal limits  CBC  I-STAT BETA HCG BLOOD, ED ([facility], [facility], [facility] ONLY)  TROPONIN I (HIGH SENSITIVITY)    TROPONIN I (HIGH SENSITIVITY)    EKG EKG Interpretation  Date/Time:  Sunday May 06 2020 13:00:36 EST Ventricular Rate:  80 PR Interval:    QRS Duration: 88 QT Interval:  404 QTC Calculation: 466 R Axis:   41 Text Interpretation: Sinus rhythm No acute changes No significant change since last tracing Confirmed by Varney Biles [phone]) on 05/06/2020 2:28:20 PM   Radiology DG Chest 2 View  Result Date: 05/06/2020 CLINICAL DATA:  Ongoing sternal chest pain. EXAM: CHEST - 2 VIEW COMPARISON:  None. FINDINGS: Normal mediastinum and cardiac silhouette. Normal pulmonary vasculature. No evidence of effusion, infiltrate, or pneumothorax. No acute bony abnormality. Lap band collar appears in proper orientation IMPRESSION: No acute cardiopulmonary process. Electronically Signed   By: Suzy Bouchard M.D.   On: 05/06/2020 14:12   CT Angio Chest PE W and/or Wo Contrast  Result Date: 05/06/2020 CLINICAL DATA:  Central chest pain, headache and dizziness EXAM: CT ANGIOGRAPHY CHEST WITH CONTRAST TECHNIQUE: Multidetector CT imaging of the chest was performed using the standard protocol during bolus administration of intravenous contrast. Multiplanar CT image reconstructions and MIPs were obtained to evaluate the vascular anatomy. CONTRAST:  111mL OMNIPAQUE IOHEXOL 350 MG/ML SOLN COMPARISON:  None. FINDINGS: Cardiovascular: There is a optimal opacification of the pulmonary arteries. There is no central,segmental, or subsegmental filling defects within the pulmonary arteries. The heart is normal in size. No pericardial effusion or thickening. No evidence  right heart strain. There is normal three-vessel brachiocephalic anatomy without proximal stenosis. The thoracic aorta is normal in appearance. Mediastinum/Nodes: No hilar, mediastinal, or axillary adenopathy. Thyroid gland, trachea are unremarkable. The patient is status post gastric lap band. There appears to be mild wall thickening seen within the  distal esophagus. The esophagus appears to be mildly dilated and air-filled with a patulous appearance. Lungs/Pleura: Streaky ground-glass opacity seen at both lung bases. No large airspace consolidation or pleural effusion. Upper Abdomen: No acute abnormalities present in the visualized portions of the upper abdomen. Musculoskeletal: No chest wall abnormality. No acute or significant osseous findings. Review of the MIP images confirms the above findings. IMPRESSION: 1. No central, segmental, or subsegmental pulmonary embolism. 2. Ground-glass streaky atelectasis seen at both lung bases. 3. Mildly dilated distal esophagus with wall thickening which could be due to mild esophagitis/gastroesophageal reflux. Electronically Signed   By: Prudencio Pair M.D.   On: 05/06/2020 18:47    Procedures Procedures (including critical care time)  Medications Ordered in ED Medications  aspirin chewable tablet 324 mg (324 mg Oral Given 05/06/20 1507)  metoprolol tartrate (LOPRESSOR) tablet 25 mg (25 mg Oral Given 05/06/20 1556)  iohexol (OMNIPAQUE) 350 MG/ML injection 100 mL (100 mLs Intravenous Contrast Given 05/06/20 1819)    ED Course  I have reviewed the triage vital signs and the nursing notes.  Pertinent labs & imaging results that were available during my care of the patient were reviewed by me and considered in my medical decision making (see chart for details).  Clinical Course as of May 06 1925  Nancy Fetter May 06, 2020  [place] Patient reassessed for the second time few minutes ago. She remains chest pain-free. Her blood pressure had come down to one sixties systolic.  I discussed with her that we will proceed with CT scan to ensure there is no concerning morphology around her surgical site.  We also discussed with her the normal troponins and close cardiology follow-up with return precautions. Patient is in agreement with the plan   [AN]  1842 Around 4 PM I had discussed patient's work-up with the cardiology  fellow on call. With patient having high-sensitivity troponin and delta below the threshold, he recommends patient get an outpatient follow-up with cardiology. Patient was reassessed at that time and she was chest pain-free.   [AN]  1925 Remains chest pain-free.  Stable for discharge.   [AN]  1925 Strict ER return precautions have been discussed, and patient is agreeing with the plan and is comfortable with the workup done and the recommendations from the ER.     [AN]    Clinical Course User Index [AN] Varney Biles, MD   MDM Rules/Calculators/A&P HEAR Score: 5                        Differential diagnosis includes: ACS syndrome Aortic dissection Valvular disorder Myocarditis Pericarditis Endocarditis PE Pneumothorax Musculoskeletal pain PUD / Gastritis / Esophagitis Esophageal spasm Perforated viscus  54 year old female comes in a chief complaint of chest pain.  Patient has typical sounding chest pain and it appears to me that she is having unstable angina.  Her BP was over 446 systolic.  Hypertensive emergency also possible.  Patient smokes a pack a day and has been smoking since age 7.  I considered aortic dissection in her differential, I think her risk stratification is lower for dissection and the history is also less concerning for dissection versus ACS.   EKG  is reassuring.  We will continue to monitor for now.   Final Clinical Impression(s) / ED Diagnoses Final diagnoses:  Precordial chest pain  Esophagitis  Hypertensive urgency    Rx / DC Orders ED Discharge Orders         Ordered    losartan (COZAAR) 50 MG tablet  Daily       Note to Pharmacy: 15 day supply only, missed labs   05/06/20 1924    pantoprazole (PROTONIX) 40 MG tablet  2 times daily        05/06/20 1925    sucralfate (CARAFATE) 1 g tablet  3 times daily with meals & bedtime        05/06/20 1925           Varney Biles, MD 05/06/20 [place], Aniqua Briere, MD 05/06/20 1926

## 2020-05-07 ENCOUNTER — Telehealth: Payer: Self-pay | Admitting: Internal Medicine

## 2020-05-07 DIAGNOSIS — R079 Chest pain, unspecified: Secondary | ICD-10-CM

## 2020-05-07 NOTE — Telephone Encounter (Signed)
Spoke with patient and discussed recommendations in regards to getting Lap-Band re-inflated. Advised that Dr. Loletha Carrow would like her to follow up with him prior to re-inflation of Lap-Band. Patient is scheduled for a follow up with Dr. Loletha Carrow on 06/26/2020 at 3:40 PM. Patient verbalized understanding of all information and had no other concerns at the end of the call

## 2020-05-07 NOTE — Telephone Encounter (Signed)
Please arrange the referral, DX chest pain. If the symptoms are increasing, persistent-  Call immediately or go to the ER.

## 2020-05-07 NOTE — Telephone Encounter (Signed)
Please advise 

## 2020-05-07 NOTE — Telephone Encounter (Signed)
Patient states she needs a referral to [facility], Pt states she went to ED twice this pass weekend and they ask her to follow up with [facility] @ [place].  Patient wend to ED b/c of jaw pain/chest pain  and back pain.

## 2020-05-07 NOTE — Telephone Encounter (Signed)
Cardiology referral placed

## 2020-05-18 ENCOUNTER — Other Ambulatory Visit: Payer: Self-pay

## 2020-05-18 ENCOUNTER — Encounter: Payer: Self-pay | Admitting: Internal Medicine

## 2020-05-18 ENCOUNTER — Ambulatory Visit (INDEPENDENT_AMBULATORY_CARE_PROVIDER_SITE_OTHER): Payer: 59 | Admitting: Internal Medicine

## 2020-05-18 VITALS — BP 136/80 | HR 81 | Ht 60.0 in | Wt 239.2 lb

## 2020-05-18 DIAGNOSIS — I209 Angina pectoris, unspecified: Secondary | ICD-10-CM | POA: Insufficient documentation

## 2020-05-18 DIAGNOSIS — I10 Essential (primary) hypertension: Secondary | ICD-10-CM | POA: Diagnosis not present

## 2020-05-18 DIAGNOSIS — I208 Other forms of angina pectoris: Secondary | ICD-10-CM

## 2020-05-18 DIAGNOSIS — G4733 Obstructive sleep apnea (adult) (pediatric): Secondary | ICD-10-CM

## 2020-05-18 DIAGNOSIS — E785 Hyperlipidemia, unspecified: Secondary | ICD-10-CM

## 2020-05-18 DIAGNOSIS — Z720 Tobacco use: Secondary | ICD-10-CM

## 2020-05-18 LAB — LIPID PANEL
Chol/HDL Ratio: 3.7 ratio (ref 0.0–4.4)
Cholesterol, Total: 204 mg/dL — ABNORMAL HIGH (ref 100–199)
HDL: 55 mg/dL (ref >39)
LDL Chol Calc (NIH): 136 mg/dL — ABNORMAL HIGH (ref 0–99)
Triglycerides: 73 mg/dL (ref 0–149)
VLDL Cholesterol Cal: 13 mg/dL (ref 5–40)

## 2020-05-18 LAB — HEPATIC FUNCTION PANEL
ALT: 25 IU/L (ref 0–32)
AST: 21 IU/L (ref 0–40)
Albumin: 3.7 g/dL — ABNORMAL LOW (ref 3.8–4.9)
Alkaline Phosphatase: 106 IU/L (ref 44–121)
Bilirubin Total: 0.2 mg/dL (ref 0.0–1.2)
Bilirubin, Direct: <0.1 mg/dL (ref 0.00–0.40)
Total Protein: 6.4 g/dL (ref 6.0–8.5)

## 2020-05-18 MED ORDER — METOPROLOL TARTRATE 100 MG PO TABS: ORAL_TABLET | ORAL | 0 refills | Status: DC

## 2020-05-18 NOTE — Progress Notes (Signed)
Cardiology Office Note:    Date:  05/18/2020   ID:  Kaitlyn Whitney, DOB 02-23-1966, MRN [id number]  PCP:  Colon Branch, MD  [place] Cardiologist:  No primary care provider on file.  [facility] Electrophysiologist:  None   CC: chest pain Consulted for the evaluation of chest pain at the behest of Colon Branch, MD  History of Present Illness:    Kaitlyn Whitney is a 54 y.o. female with a hx of HTN, HLD, Morbid Obesity, OSA on CPAP, Tobacco Abuse who presents for evaluation.  Patient notes that she is feeling prsently.  Notes that after gastric surgery 10 years prior.  05/03/20 noted changing of the saline for her LAP-BAND (due to vomiting).  A few days later she noted chest pain.  Notes chest pain, at rest with radiating to the jaw (with tightness).  Discomfort occurs at rest and does not occur when she exercises, the chest pain gets better with time and possible PPI  Patient exertion is with a personal trainer including lunges, squats.  No shortness of breath, DOE.  No PND or orthopnea.  No bendopnea, notes slight weight gain since Thanksgiving, no leg swelling, or abdominal swelling.  No syncope or near syncope.  ED evaluation 05/06/20 notable for normal novel troponin testing and no concerning testing.  Notes no palpitations or funny heart beats.     Patient reports NO prior cardiac testing including  echo,  stress test,  heart catheterizations,  cardioversion,  ablations.  Ambulatory BP 132/80.   Past Medical History:  Diagnosis Date  . Anxiety   . Arthritis    hands  . Chronic kidney disease    kidney stones  . GERD (gastroesophageal reflux disease)   . History of kidney stones 2016  . Hyperlipidemia   . Hypertension   . Obesity   . Restless leg syndrome   . Sleep apnea    c-pap    Past Surgical History:  Procedure Laterality Date  . bone spur removal     left side of lumbar spine  . BREAST SURGERY  2000   breast reduccion  . COLONOSCOPY    . HYSTERECTOMY  ABDOMINAL WITH SALPINGECTOMY Bilateral 08/18/2017   Procedure: SUPRACERVICAL HYSTERECTOMY ABDOMINAL;  Surgeon: [name], MD;  Location: [facility];  Service: Gynecology;  Laterality: Bilateral;  . LAPAROSCOPIC GASTRIC BANDING  ~ 2010  . LYSIS OF ADHESION N/A 08/18/2017   Procedure: LYSIS OF ADHESION;  Surgeon: [name], MD;  Location: [facility];  Service: Gynecology;  Laterality: N/A;  . SALPINGOOPHORECTOMY Bilateral 08/18/2017   Procedure: SALPINGO OOPHORECTOMY;  Surgeon: [name], MD;  Location: [facility];  Service: Gynecology;  Laterality: Bilateral;  . TUBAL LIGATION  2005    Current Medications: Current Meds  Medication Sig  . amLODipine (NORVASC) 10 MG tablet Take 10 mg by mouth daily.   Marland Kitchen atorvastatin (LIPITOR) 20 MG tablet Take 1 tablet (20 mg total) by mouth at bedtime.  Marland Kitchen losartan (COZAAR) 50 MG tablet Take 1 tablet (50 mg total) by mouth daily.  . meclizine (ANTIVERT) 25 MG tablet Take 1 tablet (25 mg total) by mouth 3 (three) times daily as needed for dizziness.  . ondansetron (ZOFRAN) 8 MG tablet Take 1 tablet (8 mg total) by mouth every 8 (eight) hours as needed for nausea or vomiting.  . pantoprazole (PROTONIX) 40 MG tablet Take 1 tablet (40 mg total) by mouth 2 (two) times daily.  . potassium chloride SA (  KLOR-CON) 20 MEQ tablet Take 1 tablet (20 mEq total) by mouth 2 (two) times daily.  . sucralfate (CARAFATE) 1 g tablet Take 1 tablet (1 g total) by mouth 4 (four) times daily -  with meals and at bedtime.  Marland Kitchen zolpidem (AMBIEN) 10 MG tablet Take 10 mg by mouth at bedtime as needed for sleep.      Allergies:   Penicillins   Social History   Socioeconomic History  . Marital status: Married    Spouse name: Not on file  . Number of children: 0  . Years of education: Not on file  . Highest education level: Not on file  Occupational History  . Occupation: unemplyed   Tobacco Use  . Smoking status: Current Every Day Smoker    Packs/day: 0.50    Years: 30.00     Pack years: 15.00    Types: Cigarettes    Start date: 21  . Smokeless tobacco: Never Used  . Tobacco comment: 10 cig per day  Vaping Use  . Vaping Use: Never used  Substance and Sexual Activity  . Alcohol use: Yes    Comment: rarely  . Drug use: No  . Sexual activity: Yes    Birth control/protection: Surgical  Other Topics Concern  . Not on file  Social History Narrative   2 adopted children   Household: pt, husband and daughter    Social Determinants of Radio broadcast assistant Strain:   . Difficulty of Paying Living Expenses: Not on file  Food Insecurity:   . Worried About Charity fundraiser in the Last Year: Not on file  . Ran Out of Food in the Last Year: Not on file  Transportation Needs:   . Lack of Transportation (Medical): Not on file  . Lack of Transportation (Non-Medical): Not on file  Physical Activity:   . Days of Exercise per Week: Not on file  . Minutes of Exercise per Session: Not on file  Stress:   . Feeling of Stress : Not on file  Social Connections:   . Frequency of Communication with Friends and Family: Not on file  . Frequency of Social Gatherings with Friends and Family: Not on file  . Attends Religious Services: Not on file  . Active Member of Clubs or Organizations: Not on file  . Attends Archivist Meetings: Not on file  . Marital Status: Not on file    Family History: The patient's family history includes Asthma in her mother and another family member; Diabetes in her brother; Drug abuse in her brother, brother, and father; HIV in her father; Heart attack in her brother; Hypertension in an other family member; Stroke in her brother. There is no history of Colon cancer, Breast cancer, Esophageal cancer, Rectal cancer, or Stomach cancer. Denies family history of sudden cardiac death including drowning, car accidents, or unexplained deaths in the family. No history of non-ischemic cardiomyopathies including hypertrophic  cardiomyopathy, left ventricular non-compaction, or arrhythmogenic right ventricular cardiomyopathy No history of bicuspid aortic valve or aortic aneurysm or dissection. History of coronary artery disease notable for grandfather MI in 41s, Brother had MI in 21s in th setting of substance abuse. History of arrhythmia notable for no family history.   ROS:   Please see the history of present illness.    All other systems reviewed and are negative.  EKGs/Labs/Other Studies Reviewed:    The following studies were reviewed today:  EKG:   05/06/20:  SR 80 no  ST/T changes  Recent Labs: 10/25/2019: ALT 14; TSH 1.29 05/06/2020: BUN 11; Creatinine, Ser 0.85; Hemoglobin 14.2; Platelets 292; Potassium 4.2; Sodium 141  Recent Lipid Panel    Component Value Date/Time   CHOL 172 10/25/2019 0949   TRIG 83.0 10/25/2019 0949   TRIG 54 04/15/2006 1057   HDL 36.60 (L) 10/25/2019 0949   CHOLHDL 5 10/25/2019 0949   VLDL 16.6 10/25/2019 0949   LDLCALC 119 (H) 10/25/2019 0949   CTPE 05/06/20 Notable for no PE, no coronary or aortic calcifications, mild esophageal thickening  Risk Assessment/Calculations:     The 10-year ASCVD risk score Mikey Bussing DC Brooke Bonito., et al., 2013) is: 14.1%   Values used to calculate the score:     Age: 2 years     Sex: Female     Is Non-Hispanic African American: Yes     Diabetic: No     Tobacco smoker: Yes     Systolic Blood Pressure: 237 mmHg     Is BP treated: Yes     HDL Cholesterol: 36.6 mg/dL     Total Cholesterol: 172 mg/dL   Physical Exam:    VS:  BP 136/80   Pulse 81   Ht 5' (1.524 m)   Wt 239 lb 3.2 oz (108.5 kg)   LMP 12/20/2016   SpO2 99%   BMI 46.72 kg/m     Wt Readings from Last 3 Encounters:  05/18/20 239 lb 3.2 oz (108.5 kg)  05/06/20 225 lb 15.5 oz (102.5 kg)  05/03/20 226 lb (102.5 kg)     GEN: Obese female, well developed in no acute distress HEENT: Normal NECK: No JVD; No carotid bruits LYMPHATICS: No lymphadenopathy CARDIAC: RRR, no  murmurs, rubs, gallops RESPIRATORY:  Clear to auscultation without rales, wheezing or rhonchi  ABDOMEN: Soft, non-tender, non-distended MUSCULOSKELETAL:  No edema; No deformity  SKIN: Warm and dry NEUROLOGIC:  Alert and oriented x 3 PSYCHIATRIC:  Normal affect   ASSESSMENT:    1. Angina pectoris ([place])   2. Essential hypertension   3. OBSTRUCTIVE SLEEP APNEA   4. Hyperlipidemia, unspecified hyperlipidemia type   5. Morbid obesity ([place])   6. Other forms of angina pectoris ([place])    PLAN:    In order of problems listed above:  Anginal Pectoris - The patient presents with anginal pain - ASCVD risk estimated at greater than - ASCVD 14% in the setting of family history - ASA 81 mg QD, statin - Would recommend CCTA/FFR to exclude obstructive CAD  - if positive, discussed risks and benefits of cardiac catheterization have been discussed with the patient.  These include bleeding, infection, kidney damage, stroke, heart attack, death.  The patient understands these risks and is willing to proceed if necessary  Essential Hypertension, Morbid Obesity, OSA on CPAP - ambulatory blood pressure not done consistently, will start/continue ambulatory BP monitoring; gave education on how to perform ambulatory blood pressure monitoring including the frequency and technique; goal ambulatory blood pressure < 135/85 on average - continue home medications  - discussed diet (DASH/low sodium), and exercise/weight loss interventions  Hyperlipidemia -LDL goal less than 100 - ASCVD 14% -continue current statin -Recheck lipid profile LFTs, Liver Function - gave education on dietary changes  Tobacco Abuse - discussed the dangers of tobacco use, both inhaled and oral, which include, but are not limited to cardiovascular disease, increased cancer risk of multiple types of cancer, COPD, peripheral arterial disease, strokes. - counseled on the benefits of smoking cessation. - firmly advised to  quit.  - we  also reviewed strategies to maximize success, including:  Removing cigarettes and smoking materials from environment   Stress management  Substitution of other forms of reinforcement (the one cigarette a day approach)  Support of family/friends and group smoking cessation  Selecting a quit date.  Patient provided contact information for [employer] or 1-800-QUIT-NOW  Patient provided with [employer] free smoking cessation classes: ([phone] and [employer]Wash[employer]fr   3-4 month follow up unless new symptoms or abnormal test results warranting change in plan  Would be reasonable for Virtual Follow up Would be reasonable for APP Follow up  Shared Decision Making/Informed Consent      Patient and husband amenable to plan.  Medication Adjustments/Labs and Tests Ordered: Current medicines are reviewed at length with the patient today.  Concerns regarding medicines are outlined above.  Orders Placed This Encounter  Procedures  . CT CORONARY MORPH W/CTA COR W/SCORE W/CA W/CM &/OR WO/CM  . CT CORONARY FRACTIONAL FLOW RESERVE DATA PREP  . CT CORONARY FRACTIONAL FLOW RESERVE FLUID ANALYSIS  . Lipid panel  . Hepatic function panel   Meds ordered this encounter  Medications  . metoprolol tartrate (LOPRESSOR) 100 MG tablet    Sig: Take 1 tablet by mouth 2 hours prior to CT scan    Dispense:  1 tablet    Refill:  0    Patient Instructions  Medication Instructions:  Your physician recommends that you continue on your current medications as directed. Please refer to the Current Medication list given to you today.  *If you need a refill on your cardiac medications before your next appointment, please call your pharmacy*  Lab Work: You will have labs drawn today: Lipids/LFTs  Testing/Procedures: Your cardiac CT will be scheduled at one of the below locations:   [facility] [address] [place], [place] [address] [phone]  [facility] [address] [place], [place] [address] [phone]  If scheduled at [facility], please arrive at the [facility] main entrance of [facility] 30 minutes prior to test start time. Proceed to the [facility] Radiology Department (first floor) to check-in and test prep.  If scheduled at [facility], please arrive 15 mins early for check-in and test prep.  Please follow these instructions carefully (unless otherwise directed):  On the Night Before the Test: . Be sure to Drink plenty of water. . Do not consume any caffeinated/decaffeinated beverages or chocolate 12 hours prior to your test. . Do not take any antihistamines 12 hours prior to your test.  On the Day of the Test: . Drink plenty of water. Do not drink any water within one hour of the test. . Do not eat any food 4 hours prior to the test. . You may take your regular medications prior to the test.  . Take metoprolol (Lopressor) two hours prior to test. . FEMALES- please wear underwire-free bra if available      After the Test: . Drink plenty of water. . After receiving IV contrast, you may experience a mild flushed feeling. This is normal. . On occasion, you may experience a mild rash up to 24 hours after the test. This is not dangerous. If this occurs, you can take Benadryl 25 mg and increase your fluid intake. . If you experience trouble breathing, this can be serious. If it is severe call 911 IMMEDIATELY. If it is mild, please call  our office. . If you take any of these medications: Glipizide/Metformin, Avandament, Glucavance, please do not take 48 hours after completing test unless otherwise instructed.   Once we have confirmed authorization from your insurance company, we will call you to set up a date and time for your test. Based on how quickly your insurance processes prior authorizations requests,  please allow up to 4 weeks to be contacted for scheduling your Cardiac CT appointment. Be advised that routine Cardiac CT appointments could be scheduled as many as 8 weeks after your provider has ordered it.  For non-scheduling related questions, please contact the cardiac imaging nurse navigator should you have any questions/concerns: Marchia Bond, Cardiac Imaging Nurse Navigator Burley Saver, Interim Cardiac Imaging Nurse [place] and Vascular Services Direct Office Dial: [phone]   For scheduling needs, including cancellations and rescheduling, please call [place], [phone].  Follow-Up: At [facility], you and your health needs are our priority.  As part of our continuing mission to provide you with exceptional heart care, we have created designated Provider Care Teams.  These Care Teams include your primary Cardiologist (physician) and Advanced Practice Providers (APPs -  Physician Assistants and Nurse Practitioners) who all work together to provide you with the care you need, when you need it.  Your next appointment:   3 month(s)  The format for your next appointment:   In Person  Provider:   Rudean Haskell, MD     Signed, Werner Lean, MD  05/18/2020 8:54 AM    [place]

## 2020-05-18 NOTE — Patient Instructions (Addendum)
Medication Instructions:  Your physician recommends that you continue on your current medications as directed. Please refer to the Current Medication list given to you today.  *If you need a refill on your cardiac medications before your next appointment, please call your pharmacy*  Lab Work: You will have labs drawn today: Lipids/LFTs  Testing/Procedures: Your cardiac CT will be scheduled at one of the below locations:   [facility] [address] [place], [place] [address] [phone]  [place] [address] [place], [place] [address] [phone]  If scheduled at [facility], please arrive at the [facility] main entrance of [facility] 30 minutes prior to test start time. Proceed to the [facility] Radiology Department (first floor) to check-in and test prep.  If scheduled at [facility], please arrive 15 mins early for check-in and test prep.  Please follow these instructions carefully (unless otherwise directed):  On the Night Before the Test: . Be sure to Drink plenty of water. . Do not consume any caffeinated/decaffeinated beverages or chocolate 12 hours prior to your test. . Do not take any antihistamines 12 hours prior to your test.  On the Day of the Test: . Drink plenty of water. Do not drink any water within one hour of the test. . Do not eat any food 4 hours prior to the test. . You may take your regular medications prior to the test.  . Take metoprolol (Lopressor) two hours prior to test. . FEMALES- please wear underwire-free bra if available      After the Test: . Drink plenty of water. . After receiving IV contrast, you may experience a mild flushed feeling. This is normal. . On occasion, you may experience a mild rash up to 24 hours after the test. This is not dangerous. If this occurs, you can take Benadryl 25 mg and increase your fluid  intake. . If you experience trouble breathing, this can be serious. If it is severe call 911 IMMEDIATELY. If it is mild, please call our office. . If you take any of these medications: Glipizide/Metformin, Avandament, Glucavance, please do not take 48 hours after completing test unless otherwise instructed.   Once we have confirmed authorization from your insurance company, we will call you to set up a date and time for your test. Based on how quickly your insurance processes prior authorizations requests, please allow up to 4 weeks to be contacted for scheduling your Cardiac CT appointment. Be advised that routine Cardiac CT appointments could be scheduled as many as 8 weeks after your provider has ordered it.  For non-scheduling related questions, please contact the cardiac imaging nurse navigator should you have any questions/concerns: Marchia Bond, Cardiac Imaging Nurse Navigator Burley Saver, Interim Cardiac Imaging Nurse [place] and Vascular Services Direct Office Dial: [phone]   For scheduling needs, including cancellations and rescheduling, please call [place], [phone].  Follow-Up: At [facility], you and your health needs are our priority.  As part of our continuing mission to provide you with exceptional heart care, we have created designated Provider Care Teams.  These Care Teams include your primary Cardiologist (physician) and Advanced Practice Providers (APPs -  Physician Assistants and Nurse Practitioners) who all work together to provide you with the care you need, when you need it.  Your next appointment:   3 month(s)  The format for your next appointment:   In Person  Provider:  Rudean Haskell, MD

## 2020-05-23 ENCOUNTER — Telehealth: Payer: Self-pay

## 2020-05-23 DIAGNOSIS — E785 Hyperlipidemia, unspecified: Secondary | ICD-10-CM

## 2020-05-23 MED ORDER — ATORVASTATIN CALCIUM 40 MG PO TABS: 40.0000 mg | ORAL_TABLET | Freq: Every day | ORAL | 3 refills | Status: DC

## 2020-05-23 NOTE — Telephone Encounter (Signed)
-----   Message from Werner Lean, MD sent at 05/20/2020  8:15 PM EST ----- Results: LDL above goal ASCVD 14%+ Plan: Increase to atorvastatin 40 mg; LFTs' and Lipids in 3 months  Werner Lean, MD

## 2020-05-23 NOTE — Telephone Encounter (Signed)
The patient has been notified of the result and verbalized understanding.  All questions (if any) were answered.  New Rx sent with increase dose of Atorvastatin 40 mg Lab appt made for 08/15/20  Wilma Flavin, RN 05/23/2020 11:30 AM

## 2020-05-25 ENCOUNTER — Ambulatory Visit: Payer: BC Managed Care – PPO | Admitting: Internal Medicine

## 2020-06-21 ENCOUNTER — Other Ambulatory Visit: Payer: Self-pay | Admitting: Internal Medicine

## 2020-06-21 DIAGNOSIS — Z4651 Encounter for fitting and adjustment of gastric lap band: Secondary | ICD-10-CM | POA: Diagnosis not present

## 2020-06-21 DIAGNOSIS — Z9884 Bariatric surgery status: Secondary | ICD-10-CM | POA: Diagnosis not present

## 2020-06-21 MED ORDER — ATORVASTATIN CALCIUM 40 MG PO TABS
40.0000 mg | ORAL_TABLET | Freq: Every day | ORAL | 0 refills | Status: DC
Start: 2020-06-21 — End: 2022-09-15

## 2020-06-26 ENCOUNTER — Ambulatory Visit (INDEPENDENT_AMBULATORY_CARE_PROVIDER_SITE_OTHER): Payer: 59 | Admitting: Gastroenterology

## 2020-06-26 ENCOUNTER — Encounter: Payer: Self-pay | Admitting: Gastroenterology

## 2020-06-26 VITALS — BP 130/80 | HR 70 | Ht 60.0 in | Wt 252.0 lb

## 2020-06-26 DIAGNOSIS — K9509 Other complications of gastric band procedure: Secondary | ICD-10-CM

## 2020-06-26 DIAGNOSIS — K2100 Gastro-esophageal reflux disease with esophagitis, without bleeding: Secondary | ICD-10-CM

## 2020-06-26 DIAGNOSIS — R1319 Other dysphagia: Secondary | ICD-10-CM | POA: Diagnosis not present

## 2020-06-26 NOTE — Patient Instructions (Signed)
If you are age 54 or older, your body mass index should be between 23-30. Your Body mass index is 49.22 kg/m. If this is out of the aforementioned range listed, please consider follow up with your Primary Care Provider.  If you are age 53 or younger, your body mass index should be between 19-25. Your Body mass index is 49.22 kg/m. If this is out of the aformentioned range listed, please consider follow up with your Primary Care Provider.   It was a pleasure to see you today!  Dr. Loletha Carrow

## 2020-06-26 NOTE — Progress Notes (Signed)
[facility] Progress Note  Chief Complaint: Esophageal dysphagia  Subjective  History: Upper endoscopy 02/27/2020 for reflux symptoms and dysphagia showed reflux esophagitis as well as mild diffuse esophageal dilatation and extrinsic compression of the gastric cardia from Lap-Band device. Subsequent barium study done on 03/09/2020, patient then referred to Dr. Lucia Whitney at [facility] with request that Lap-Band device be completely deflated. That was done on 05/04/2020 with plans to follow-up with Dr. Greer Whitney and consider partial refill of the Lap-Band. (Dr. Lucia Whitney recently retired)  Kaitlyn Whitney says the swallowing difficulty completely resolved with the Lap-Band deflation.  She was seen at that office again by an NP recently and said 5 cc of fluid was reinflated but she has not noticed any difference.  She is also bothered by her 25 pound weight gain in the last couple of months since the Lap-Band was deflated, so she was hoping that it could be inflated back to its previous volume.  ROS: Cardiovascular:  no chest pain Respiratory: no dyspnea  The patient's Past Medical, Family and Social History were reviewed and are on file in the EMR.  Objective:  Med list reviewed  Current Outpatient Medications:  .  amLODipine (NORVASC) 10 MG tablet, Take 10 mg by mouth daily. , Disp: , Rfl:  .  atorvastatin (LIPITOR) 40 MG tablet, Take 1 tablet (40 mg total) by mouth daily., Disp: 15 tablet, Rfl: 0 .  losartan (COZAAR) 50 MG tablet, Take 1 tablet (50 mg total) by mouth daily., Disp: 15 tablet, Rfl: 0 .  meclizine (ANTIVERT) 25 MG tablet, Take 1 tablet (25 mg total) by mouth 3 (three) times daily as needed for dizziness., Disp: 30 tablet, Rfl: 0 .  metoprolol tartrate (LOPRESSOR) 100 MG tablet, Take 1 tablet by mouth 2 hours prior to CT scan, Disp: 1 tablet, Rfl: 0 .  ondansetron (ZOFRAN) 8 MG tablet, Take 1 tablet (8 mg total) by mouth every 8 (eight) hours as needed for nausea or vomiting., Disp: 20  tablet, Rfl: 0 .  pantoprazole (PROTONIX) 40 MG tablet, Take 1 tablet (40 mg total) by mouth 2 (two) times daily., Disp: 60 tablet, Rfl: 2 .  potassium chloride SA (KLOR-CON) 20 MEQ tablet, Take 1 tablet (20 mEq total) by mouth 2 (two) times daily., Disp: 10 tablet, Rfl: 0 .  sucralfate (CARAFATE) 1 g tablet, Take 1 tablet (1 g total) by mouth 4 (four) times daily -  with meals and at bedtime., Disp: 30 tablet, Rfl: 0 .  zolpidem (AMBIEN) 10 MG tablet, Take 10 mg by mouth at bedtime as needed for sleep. , Disp: , Rfl: 3   Vital signs in last 24 hrs: Vitals:   06/26/20 1523  BP: 130/80  Pulse: 70  SpO2: 97%   Wt Readings from Last 3 Encounters:  06/26/20 252 lb (114.3 kg)  05/18/20 239 lb 3.2 oz (108.5 kg)  05/06/20 225 lb 15.5 oz (102.5 kg)    Physical Exam  No exam-entire  visit spent in discussion. Labs:   ___________________________________________ Radiologic studies: CLINICAL DATA:  Dysphagia. Possible dysmotility. History of lap band surgery.   EXAM: ESOPHOGRAM/BARIUM SWALLOW   TECHNIQUE: Single contrast examination was performed using 1 minutes and 18 seconds.   FLUOROSCOPY TIME:  Fluoroscopy Time:  39 mGy   Radiation Exposure Index (if provided by the fluoroscopic device):   Number of Acquired Spot Images: 0   COMPARISON:  None.   FINDINGS: Frontal and lateral views of the hypopharynx while swallowing thick barium  are unremarkable.   After drinking the 2 swallows of thick barium, the esophagus was evaluated in an upright position demonstrating a column of barium filling the distal third of the esophagus, residua from the swallows. There is a string sign of contrast through the lap band with only a trace amount of contrast entering the proximal gastric lumen.   Given the stasis of contrast material in the distal esophagus, no further barium was administered. We observe the patient for nearly 10 minutes and there was no further passage of contrast from  the distal esophagus into the stomach.   IMPRESSION: Limited study due to stasis of contrast material in the distal esophagus. After nearly 10 minutes of observation in an upright position only trace barium passed through the lap band into the proximal stomach with the remaining ingested barium remaining in the distal 1/3 of the esophagus.     Electronically Signed   By: Kaitlyn Whitney M.D.   On: 03/09/2020 12:36   ____________________________________________ Other:   _____________________________________________ Assessment & Plan  Assessment: Encounter Diagnoses  Name Primary?  . Gastroesophageal reflux disease with esophagitis without hemorrhage Yes  . Esophageal dysphagia   . Other complications of gastric band procedure    Her dysphagia was partially related to the extrinsic compression of the gastric cardia from the device, but more so the esophageal dysmotility and achalasia-like picture caused by the extrinsic gastric compression. As such, her device should not be inflated any further or the same thing will recur.  Even if the device remains completely deflated or is removed, we do not know whether the esophageal dysmotility will completely resolve.  I certainly understand her concern with the recent weight gain and think she should see Dr. Greer Whitney to discuss other options for bariatrics.  I do not know if gastric sleeve or bypass are warranted or technically feasible when the Lap-Band has been in place for many years.   Plan: I was originally considering a repeat barium study after about 2 months since Lap-Band deflation, but her symptoms are much improved.  Since my opinion is the Lap-Band be abandoned at this point, a repeat barium study will not change that management.  I have copied my note to Dr. Greer Whitney so follow-up can be arranged with him.  I asked Kaitlyn Whitney to call their office if she has not heard from them in about a week  PPI can be discontinued.  20  minutes were spent on this encounter (including chart review, history/exam, counseling/coordination of care, and documentation) > 50%of that time was spent on counseling and coordination of care.  Topics discussed included: Lap-Band, GERD, dysphagia.  Nelida Meuse III

## 2020-08-15 ENCOUNTER — Other Ambulatory Visit: Payer: 59

## 2020-08-17 ENCOUNTER — Ambulatory Visit: Payer: BC Managed Care – PPO | Admitting: Internal Medicine

## 2020-08-17 NOTE — Progress Notes (Deleted)
Cardiology Office Note:    Date:  08/17/2020   ID:  Kaitlyn Whitney, DOB 12-12-1965, MRN [id number]  PCP:  Colon Branch, MD  [facility] Cardiologist:  Rudean Haskell MD [place] Electrophysiologist:  None   CC: chest pain follow up  History of Present Illness:    Kaitlyn Whitney is a 55 y.o. female with a hx of HTN, HLD, Morbid Obesity, OSA on CPAP, Tobacco Abuse who presented for evaluation 05/18/20.  In interim of this visit, patient ***.   Patient notes that she is feeling prsently.  Notes that after gastric surgery 10 years prior.  05/03/20 noted changing of the saline for her LAP-BAND (due to vomiting).  A few days later she noted chest pain.  Notes chest pain, at rest with radiating to the jaw (with tightness).  Discomfort occurs at rest and does not occur when she exercises, the chest pain gets better with time and possible PPI  Patient exertion is with a personal trainer including lunges, squats.  No shortness of breath, DOE.  No PND or orthopnea.  No bendopnea, notes slight weight gain since Thanksgiving, no leg swelling, or abdominal swelling.  No syncope or near syncope.  ED evaluation 05/06/20 notable for normal novel troponin testing and no concerning testing.  Notes no palpitations or funny heart beats.     Patient reports NO prior cardiac testing including  echo,  stress test,  heart catheterizations,  cardioversion,  ablations.  Ambulatory BP 132/80.   Past Medical History:  Diagnosis Date  . Anxiety   . Arthritis    hands  . Chronic kidney disease    kidney stones  . GERD (gastroesophageal reflux disease)   . History of kidney stones 2016  . Hyperlipidemia   . Hypertension   . Obesity   . Restless leg syndrome   . Sleep apnea    c-pap    Past Surgical History:  Procedure Laterality Date  . bone spur removal     left side of lumbar spine  . BREAST SURGERY  2000   breast reduccion  . COLONOSCOPY    . HYSTERECTOMY ABDOMINAL WITH  SALPINGECTOMY Bilateral 08/18/2017   Procedure: SUPRACERVICAL HYSTERECTOMY ABDOMINAL;  Surgeon: [name], MD;  Location: [facility];  Service: Gynecology;  Laterality: Bilateral;  . LAPAROSCOPIC GASTRIC BANDING  ~ 2010  . LYSIS OF ADHESION N/A 08/18/2017   Procedure: LYSIS OF ADHESION;  Surgeon: [name], MD;  Location: [facility];  Service: Gynecology;  Laterality: N/A;  . SALPINGOOPHORECTOMY Bilateral 08/18/2017   Procedure: SALPINGO OOPHORECTOMY;  Surgeon: [name], MD;  Location: [facility];  Service: Gynecology;  Laterality: Bilateral;  . TUBAL LIGATION  2005    Current Medications: No outpatient medications have been marked as taking for the 08/17/20 encounter (Appointment) with Werner Lean, MD.     Allergies:   Penicillins   Social History   Socioeconomic History  . Marital status: Married    Spouse name: Not on file  . Number of children: 0  . Years of education: Not on file  . Highest education level: Not on file  Occupational History  . Occupation: unemplyed   Tobacco Use  . Smoking status: Current Every Day Smoker    Packs/day: 0.50    Years: 30.00    Pack years: 15.00    Types: Cigarettes    Start date: 60  . Smokeless tobacco: Never Used  . Tobacco comment: 10 cig per day  Vaping  Use  . Vaping Use: Never used  Substance and Sexual Activity  . Alcohol use: Yes    Comment: rarely  . Drug use: No  . Sexual activity: Yes    Birth control/protection: Surgical  Other Topics Concern  . Not on file  Social History Narrative   2 adopted children   Household: pt, husband and daughter    Social Determinants of Radio broadcast assistant Strain: Not on file  Food Insecurity: Not on file  Transportation Needs: Not on file  Physical Activity: Not on file  Stress: Not on file  Social Connections: Not on file    Family History: The patient's family history includes Asthma in her mother and another family member; Diabetes in her brother; Drug  abuse in her brother, brother, and father; HIV in her father; Heart attack in her brother; Hypertension in an other family member; Stroke in her brother. There is no history of Colon cancer, Breast cancer, Esophageal cancer, Rectal cancer, or Stomach cancer. Denies family history of sudden cardiac death including drowning, car accidents, or unexplained deaths in the family. No history of non-ischemic cardiomyopathies including hypertrophic cardiomyopathy, left ventricular non-compaction, or arrhythmogenic right ventricular cardiomyopathy No history of bicuspid aortic valve or aortic aneurysm or dissection. History of coronary artery disease notable for grandfather MI in 33s, Brother had MI in 29s in th setting of substance abuse. History of arrhythmia notable for no family history.   ROS:   Please see the history of present illness.    All other systems reviewed and are negative.  EKGs/Labs/Other Studies Reviewed:    The following studies were reviewed today:  EKG:   05/06/20:  SR 80 no ST/T changes  NonCardiac CT: Date:11/21 Results: Notable for no PE, no coronary or aortic calcifications, mild esophageal thickening    Recent Labs: 10/25/2019: TSH 1.29 05/06/2020: BUN 11; Creatinine, Ser 0.85; Hemoglobin 14.2; Platelets 292; Potassium 4.2; Sodium 141 05/18/2020: ALT 25  Recent Lipid Panel    Component Value Date/Time   CHOL 204 (H) 05/18/2020 0848   TRIG 73 05/18/2020 0848   TRIG 54 04/15/2006 1057   HDL 55 05/18/2020 0848   CHOLHDL 3.7 05/18/2020 0848   CHOLHDL 5 10/25/2019 0949   VLDL 16.6 10/25/2019 0949   LDLCALC 136 (H) 05/18/2020 0848    Risk Assessment/Calculations:     The 10-year ASCVD risk score Mikey Bussing DC Brooke Bonito., et al., 2013) is: 9%   Values used to calculate the score:     Age: 7 years     Sex: Female     Is Non-Hispanic African American: Yes     Diabetic: No     Tobacco smoker: Yes     Systolic Blood Pressure: 154 mmHg     Is BP treated: Yes     HDL  Cholesterol: 55 mg/dL     Total Cholesterol: 204 mg/dL   Physical Exam:    VS:  LMP 12/20/2016     Wt Readings from Last 3 Encounters:  06/26/20 252 lb (114.3 kg)  05/18/20 239 lb 3.2 oz (108.5 kg)  05/06/20 225 lb 15.5 oz (102.5 kg)     GEN: Obese female, well developed in no acute distress HEENT: Normal NECK: No JVD; No carotid bruits LYMPHATICS: No lymphadenopathy CARDIAC: RRR, no murmurs, rubs, gallops RESPIRATORY:  Clear to auscultation without rales, wheezing or rhonchi  ABDOMEN: Soft, non-tender, non-distended MUSCULOSKELETAL:  No edema; No deformity  SKIN: Warm and dry NEUROLOGIC:  Alert and oriented x 3  PSYCHIATRIC:  Normal affect   ASSESSMENT:    No diagnosis found. PLAN:    In order of problems listed above:  Anginal Pectoris - The patient presents with anginal pain - ASCVD risk estimated at greater than - ASCVD 14% in the setting of family history - ASA 81 mg QD, atorvastatin 40 mg - Would recommend CCTA/FFR to exclude obstructive CAD  - if positive, discussed risks and benefits of cardiac catheterization have been discussed with the patient.  These include bleeding, infection, kidney damage, stroke, heart attack, death.  The patient understands these risks and is willing to proceed if necessary  Essential Hypertension, Morbid Obesity, OSA on CPAP - ambulatory blood pressure not done consistently, will start ambulatory BP monitoring; gave education on how to perform ambulatory blood pressure monitoring including the frequency and technique; goal ambulatory blood pressure < 135/85 on average - continue home medications  - discussed diet (DASH/low sodium), and exercise/weight loss interventions  Hyperlipidemia -LDL goal less than 100 - ASCVD 14% -continue current statin - gave education on dietary changes  Tobacco Abuse - discussed the dangers of tobacco use, both inhaled and oral, which include, but are not limited to cardiovascular disease, increased  cancer risk of multiple types of cancer, COPD, peripheral arterial disease, strokes. - counseled on the benefits of smoking cessation. - firmly advised to quit.  - we also reviewed strategies to maximize success, including:  Removing cigarettes and smoking materials from environment   Stress management  Substitution of other forms of reinforcement (the one cigarette a day approach)  Support of family/friends and group smoking cessation  Selecting a quit date.  Patient provided contact information for [facility] or 1-800-QUIT-NOW  Patient provided with [place]'s 8 free smoking cessation classes: ([phone] and [employer].fr   3-4 month follow up unless new symptoms or abnormal test results warranting change in plan  Would be reasonable for Virtual Follow up Would be reasonable for APP Follow up  Medication Adjustments/Labs and Tests Ordered: Current medicines are reviewed at length with the patient today.  Concerns regarding medicines are outlined above.  No orders of the defined types were placed in this encounter.  No orders of the defined types were placed in this encounter.   There are no Patient Instructions on file for this visit.   Signed, Werner Lean, MD  08/17/2020 1:14 PM    [facility]

## 2020-08-22 DIAGNOSIS — Z9884 Bariatric surgery status: Secondary | ICD-10-CM | POA: Diagnosis not present

## 2020-08-22 DIAGNOSIS — Z720 Tobacco use: Secondary | ICD-10-CM | POA: Diagnosis not present

## 2020-09-11 ENCOUNTER — Encounter: Payer: Self-pay | Admitting: Internal Medicine

## 2020-09-27 ENCOUNTER — Telehealth: Payer: Self-pay

## 2020-11-06 ENCOUNTER — Ambulatory Visit: Payer: BC Managed Care – PPO | Admitting: Obstetrics and Gynecology

## 2020-12-19 ENCOUNTER — Other Ambulatory Visit (HOSPITAL_COMMUNITY)
Admission: RE | Admit: 2020-12-19 | Discharge: 2020-12-19 | Disposition: A | Discharge status: Home / Self Care | Payer: BC Managed Care – PPO | Source: Ambulatory Visit | Attending: Obstetrics and Gynecology | Admitting: Obstetrics and Gynecology

## 2020-12-19 ENCOUNTER — Encounter: Payer: Self-pay | Admitting: Obstetrics and Gynecology

## 2020-12-19 ENCOUNTER — Other Ambulatory Visit: Payer: Self-pay

## 2020-12-19 ENCOUNTER — Ambulatory Visit (INDEPENDENT_AMBULATORY_CARE_PROVIDER_SITE_OTHER): Payer: BC Managed Care – PPO | Admitting: Obstetrics and Gynecology

## 2020-12-19 VITALS — BP 172/113 | HR 97 | Ht 60.0 in | Wt 283.9 lb

## 2020-12-19 DIAGNOSIS — Z01419 Encounter for gynecological examination (general) (routine) without abnormal findings: Secondary | ICD-10-CM | POA: Diagnosis not present

## 2020-12-19 DIAGNOSIS — Z01411 Encounter for gynecological examination (general) (routine) with abnormal findings: Secondary | ICD-10-CM

## 2020-12-19 DIAGNOSIS — Z6841 Body Mass Index (BMI) 40.0 and over, adult: Secondary | ICD-10-CM

## 2020-12-19 NOTE — Progress Notes (Signed)
Subjective:     Kaitlyn Whitney is a 55 y.o. female and is here for a comprehensive physical exam. The patient reports no problems. Patient is s/p [facility] in 2019. She denies any episodes of vaginal bleeding. Patient is sexually active without complaints. She denies any urinary incontinence. Patient reports a 60 lb weight gain over the past 6 months due to overeating following lap band removal. Patient is upset about her inability to lose weight. Patient is aware that she has an overeating issue.   Past Medical History:  Diagnosis Date   Anxiety    Arthritis    hands   Chronic kidney disease    kidney stones   GERD (gastroesophageal reflux disease)    History of kidney stones 2016   Hyperlipidemia    Hypertension    Obesity    Restless leg syndrome    Sleep apnea    c-pap   Past Surgical History:  Procedure Laterality Date   bone spur removal     left side of lumbar spine   BREAST SURGERY  2000   breast reduccion   COLONOSCOPY     HYSTERECTOMY ABDOMINAL WITH SALPINGECTOMY Bilateral 08/18/2017   Procedure: SUPRACERVICAL HYSTERECTOMY ABDOMINAL;  Surgeon: [name], MD;  Location: [facility];  Service: Gynecology;  Laterality: Bilateral;   LAPAROSCOPIC GASTRIC BANDING  ~ 2010   LYSIS OF ADHESION N/A 08/18/2017   Procedure: LYSIS OF ADHESION;  Surgeon: [name], MD;  Location: [facility];  Service: Gynecology;  Laterality: N/A;   SALPINGOOPHORECTOMY Bilateral 08/18/2017   Procedure: SALPINGO OOPHORECTOMY;  Surgeon: [name], MD;  Location: [facility];  Service: Gynecology;  Laterality: Bilateral;   TUBAL LIGATION  2005    Family History  Problem Relation Age of Onset   Asthma Mother    Drug abuse Father    HIV Father    Drug abuse Brother    Diabetes Brother    Drug abuse Brother    Heart attack Brother    Stroke Brother    Asthma Other    Hypertension Other    Colon cancer Neg Hx    Breast cancer Neg Hx    Esophageal cancer Neg Hx    Rectal cancer Neg Hx    Stomach  cancer Neg Hx      Social History   Socioeconomic History   Marital status: Married    Spouse name: Not on file   Number of children: 0   Years of education: Not on file   Highest education level: Not on file  Occupational History   Occupation: unemplyed   Tobacco Use   Smoking status: Every Day    Packs/day: 0.50    Years: 30.00    Pack years: 15.00    Types: Cigarettes    Start date: 1984   Smokeless tobacco: Never   Tobacco comments:    10 cig per day  Vaping Use   Vaping Use: Never used  Substance and Sexual Activity   Alcohol use: Yes    Comment: rarely   Drug use: No   Sexual activity: Yes    Birth control/protection: Surgical  Other Topics Concern   Not on file  Social History Narrative   2 adopted children   Household: pt, husband and daughter    Social Determinants of Health   Financial Resource Strain: Not on file  Food Insecurity: Not on file  Transportation Needs: Not on file  Physical Activity: Not on file  Stress: Not  on file  Social Connections: Not on file  Intimate Partner Violence: Not on file   Health Maintenance  Topic Date Due   COVID-19 Vaccine (1) Never done   Pneumococcal Vaccine 51-59 Years old (1 - PCV) Never done   Hepatitis C Screening  Never done   MAMMOGRAM  02/06/2017   Zoster Vaccines- Shingrix (1 of 2) Never done   COLONOSCOPY (Pts 45-1yrs Insurance coverage will need to be confirmed)  11/17/2017   PAP SMEAR-Modifier  04/28/2020   INFLUENZA VACCINE  01/14/2021   TETANUS/TDAP  02/21/2030   HIV Screening  Completed   HPV VACCINES  Aged Out       Review of Systems Pertinent items noted in HPI and remainder of comprehensive ROS otherwise negative.   Objective:  Blood pressure (!) 172/113, pulse 97, height 5' (1.524 m), weight 283 lb 14.4 oz (128.8 kg), last menstrual period 12/20/2016.  GENERAL: Well-developed, well-nourished female in no acute distress.  HEENT: Normocephalic, atraumatic. Sclerae anicteric.  NECK:  Supple. Normal thyroid.  LUNGS: Clear to auscultation bilaterally.  HEART: Regular rate and rhythm. BREASTS: Symmetric in size. No palpable masses or lymphadenopathy, skin changes, or nipple drainage. ABDOMEN: Soft, nontender, nondistended. No organomegaly. PELVIC: Normal external female genitalia. Vagina is pink and rugated.  Normal discharge. Normal appearing cervix.   No adnexal mass or tenderness. EXTREMITIES: No cyanosis, clubbing, or edema, 2+ distal pulses.    Assessment:    Healthy female exam.      Plan:    Pap smear collected Screening mammogram ordered Vaginal swab collected per patient request Patient scheduled for colonoscopy Patient referred to nutritionist Patient will be contacted with abnormal results See After Visit Summary for Counseling Recommendations

## 2020-12-19 NOTE — Progress Notes (Signed)
New Patient is in the office for annual. Last pap 04-28-17, Hysterectomy 2019 BP elevated, pt states she did not take meds today. Pt needs mammogram, desires std testing.

## 2020-12-20 LAB — CERVICOVAGINAL ANCILLARY ONLY
Bacterial Vaginitis (gardnerella): POSITIVE — AB
Candida Glabrata: NEGATIVE
Candida Vaginitis: NEGATIVE
Chlamydia: NEGATIVE
Comment: NEGATIVE
Comment: NEGATIVE
Comment: NEGATIVE
Comment: NEGATIVE
Comment: NEGATIVE
Comment: NORMAL
Neisseria Gonorrhea: NEGATIVE
Trichomonas: POSITIVE — AB

## 2020-12-21 LAB — CYTOLOGY - PAP
Adequacy: ABSENT
Comment: NEGATIVE
Diagnosis: NEGATIVE
High risk HPV: NEGATIVE

## 2020-12-24 ENCOUNTER — Telehealth: Payer: Self-pay

## 2020-12-24 ENCOUNTER — Other Ambulatory Visit: Payer: Self-pay

## 2020-12-24 ENCOUNTER — Other Ambulatory Visit: Payer: Self-pay | Admitting: Internal Medicine

## 2020-12-24 MED ORDER — METRONIDAZOLE 500 MG PO TABS
500.0000 mg | ORAL_TABLET | Freq: Two times a day (BID) | ORAL | 0 refills | Status: DC
Start: 2020-12-24 — End: 2021-10-16

## 2020-12-24 NOTE — Telephone Encounter (Signed)
-----   Message from Mora Bellman, MD sent at 12/24/2020  8:26 AM EDT ----- Please inform patient of both BV and trichomonas infection. Trichomonas is considered an STD and her partner needs to be informed and treated. A prescription was sent to her pharmacy

## 2020-12-24 NOTE — Addendum Note (Signed)
Addended by: Mora Bellman on: 12/24/2020 08:26 AM   Modules accepted: Orders

## 2020-12-24 NOTE — Telephone Encounter (Signed)
Call patient to inform her of test results.

## 2020-12-27 ENCOUNTER — Ambulatory Visit (HOSPITAL_BASED_OUTPATIENT_CLINIC_OR_DEPARTMENT_OTHER)
Admission: RE | Admit: 2020-12-27 | Discharge: 2020-12-27 | Disposition: A | Discharge status: Home / Self Care | Payer: BC Managed Care – PPO | Source: Ambulatory Visit | Attending: Obstetrics and Gynecology | Admitting: Obstetrics and Gynecology

## 2020-12-27 ENCOUNTER — Ambulatory Visit (HOSPITAL_BASED_OUTPATIENT_CLINIC_OR_DEPARTMENT_OTHER): Payer: BC Managed Care – PPO | Admitting: Radiology

## 2020-12-27 ENCOUNTER — Other Ambulatory Visit: Payer: Self-pay

## 2020-12-27 DIAGNOSIS — Z1231 Encounter for screening mammogram for malignant neoplasm of breast: Secondary | ICD-10-CM | POA: Diagnosis not present

## 2020-12-27 DIAGNOSIS — Z01411 Encounter for gynecological examination (general) (routine) with abnormal findings: Secondary | ICD-10-CM

## 2021-01-01 ENCOUNTER — Other Ambulatory Visit: Payer: BC Managed Care – PPO

## 2021-06-19 ENCOUNTER — Other Ambulatory Visit: Payer: Self-pay | Admitting: Internal Medicine

## 2021-06-20 ENCOUNTER — Encounter: Payer: Self-pay | Admitting: Internal Medicine

## 2021-06-20 ENCOUNTER — Other Ambulatory Visit: Payer: Self-pay | Admitting: Internal Medicine

## 2021-08-16 ENCOUNTER — Encounter: Payer: Self-pay | Admitting: Internal Medicine

## 2021-10-16 ENCOUNTER — Ambulatory Visit (HOSPITAL_COMMUNITY)
Admission: EM | Admit: 2021-10-16 | Discharge: 2021-10-16 | Disposition: A | Discharge status: Home / Self Care | Payer: 59 | Attending: Emergency Medicine | Admitting: Emergency Medicine

## 2021-10-16 ENCOUNTER — Other Ambulatory Visit: Payer: Self-pay

## 2021-10-16 ENCOUNTER — Encounter (HOSPITAL_COMMUNITY): Payer: Self-pay | Admitting: Emergency Medicine

## 2021-10-16 ENCOUNTER — Emergency Department (HOSPITAL_COMMUNITY)
Admission: EM | Admit: 2021-10-16 | Discharge: 2021-10-16 | Discharge status: Against Medical Advice | Payer: 59 | Attending: Emergency Medicine | Admitting: Emergency Medicine

## 2021-10-16 DIAGNOSIS — J0100 Acute maxillary sinusitis, unspecified: Secondary | ICD-10-CM | POA: Diagnosis not present

## 2021-10-16 DIAGNOSIS — R519 Headache, unspecified: Secondary | ICD-10-CM | POA: Insufficient documentation

## 2021-10-16 DIAGNOSIS — Z5321 Procedure and treatment not carried out due to patient leaving prior to being seen by health care provider: Secondary | ICD-10-CM | POA: Diagnosis not present

## 2021-10-16 MED ORDER — DOXYCYCLINE HYCLATE 100 MG PO CAPS: 100.0000 mg | ORAL_CAPSULE | Freq: Two times a day (BID) | ORAL | 0 refills | Status: AC

## 2021-10-16 MED ORDER — DOXYCYCLINE HYCLATE 100 MG PO CAPS: 100.0000 mg | ORAL_CAPSULE | Freq: Two times a day (BID) | ORAL | 0 refills | Status: DC

## 2021-10-16 NOTE — ED Triage Notes (Signed)
Pt c/o sinus pain and pressure for several days that radiates to neck.  ?

## 2021-10-16 NOTE — ED Provider Notes (Signed)
[facility]    CSN: [id number] Arrival date & time: 10/16/21  1154      History   Chief Complaint Chief Complaint  Patient presents with   Sinus Problem    HPI Kaitlyn Whitney is a 56 y.o. female.   Patient presents with right-sided sinus pain and pressure, generalized headache, left-sided neck pain for 4 days.  Tolerating food and liquids.  No known sick contacts.  Has attempted use of Sudafed and Alka-Seltzer which has been ineffective.  Has attempted use of saline irrigation which she was unable to tolerate.  Denies fever, chills, body aches, nasal congestion, rhinorrhea, sore throat, cough, shortness of breath or wheezing.     Past Medical History:  Diagnosis Date   Anxiety    Arthritis    hands   Chronic kidney disease    kidney stones   GERD (gastroesophageal reflux disease)    History of kidney stones 2016   Hyperlipidemia    Hypertension    Obesity    Restless leg syndrome    Sleep apnea    c-pap    Patient Active Problem List   Diagnosis Date Noted   Hyperlipidemia 05/18/2020   Morbid obesity (HCC) 05/18/2020   Angina pectoris (HCC) 05/18/2020   Annual physical exam 02/23/2020   PCP NOTES >>>>>>>>>>>>>> 10/26/2019   Neuralgia of groin 11/30/2017   Neuropathy 09/23/2017   Class 3 severe obesity with body mass index (BMI) of 45.0 to 49.9 in adult (HCC) 07/22/2017   Other insomnia 07/22/2017   Essential hypertension 02/27/2015   History of laparoscopic adjustable gastric banding, APL.  10/23/2008. 09/02/2012   MERALGIA PARESTHETICA 03/07/2008   DEPRESSION 07/01/2007   OBSTRUCTIVE SLEEP APNEA 07/01/2007    Past Surgical History:  Procedure Laterality Date   bone spur removal     left side of lumbar spine   BREAST SURGERY  2000   breast reduccion   COLONOSCOPY     HYSTERECTOMY ABDOMINAL WITH SALPINGECTOMY Bilateral 08/18/2017   Procedure: SUPRACERVICAL HYSTERECTOMY ABDOMINAL;  Surgeon: Hermina Staggers, MD;  Location: [facility];  Service:  Gynecology;  Laterality: Bilateral;   LAPAROSCOPIC GASTRIC BANDING  ~ 2010   LYSIS OF ADHESION N/A 08/18/2017   Procedure: LYSIS OF ADHESION;  Surgeon: Hermina Staggers, MD;  Location: [facility];  Service: Gynecology;  Laterality: N/A;   SALPINGOOPHORECTOMY Bilateral 08/18/2017   Procedure: SALPINGO OOPHORECTOMY;  Surgeon: Hermina Staggers, MD;  Location: [facility];  Service: Gynecology;  Laterality: Bilateral;   TUBAL LIGATION  2005    OB History     Gravida  0   Para      Term      Preterm      AB      Living         SAB      IAB      Ectopic      Multiple      Live Births               Home Medications    Prior to Admission medications   Medication Sig Start Date End Date Taking? Authorizing Provider  amLODipine (NORVASC) 10 MG tablet Take 10 mg by mouth daily.     [provider]  atorvastatin (LIPITOR) 40 MG tablet Take 1 tablet (40 mg total) by mouth daily. 06/21/20   Wanda Plump, MD  losartan (COZAAR) 50 MG tablet Take 1 tablet (50 mg total) by mouth daily. Patient not taking: Reported  on 12/19/2020 05/06/20   Derwood Kaplan, MD  meclizine (ANTIVERT) 25 MG tablet Take 1 tablet (25 mg total) by mouth 3 (three) times daily as needed for dizziness. Patient not taking: Reported on 12/19/2020 09/19/19   Eustace Moore, MD  metoprolol tartrate (LOPRESSOR) 100 MG tablet Take 1 tablet by mouth 2 hours prior to CT scan Patient not taking: Reported on 12/19/2020 05/18/20   Christell Constant, MD  metroNIDAZOLE (FLAGYL) 500 MG tablet Take 1 tablet (500 mg total) by mouth 2 (two) times daily. 12/24/20   Constant, Peggy, MD  ondansetron (ZOFRAN) 8 MG tablet Take 1 tablet (8 mg total) by mouth every 8 (eight) hours as needed for nausea or vomiting. Patient not taking: Reported on 12/19/2020 09/19/19   Eustace Moore, MD  pantoprazole (PROTONIX) 40 MG tablet Take 1 tablet (40 mg total) by mouth 2 (two) times daily. Patient not taking: Reported on 12/19/2020 05/06/20    Derwood Kaplan, MD  potassium chloride SA (KLOR-CON) 20 MEQ tablet Take 1 tablet (20 mEq total) by mouth 2 (two) times daily. Patient not taking: Reported on 12/19/2020 05/03/20   Palumbo, April, MD  sucralfate (CARAFATE) 1 g tablet Take 1 tablet (1 g total) by mouth 4 (four) times daily -  with meals and at bedtime. Patient not taking: Reported on 12/19/2020 05/06/20   Derwood Kaplan, MD  zolpidem (AMBIEN) 10 MG tablet Take 10 mg by mouth at bedtime as needed for sleep.  Patient not taking: Reported on 12/19/2020 06/18/17   [provider]    Family History Family History  Problem Relation Age of Onset   Asthma Mother    Drug abuse Father    HIV Father    Drug abuse Brother    Diabetes Brother    Drug abuse Brother    Heart attack Brother    Stroke Brother    Asthma Other    Hypertension Other    Colon cancer Neg Hx    Breast cancer Neg Hx    Esophageal cancer Neg Hx    Rectal cancer Neg Hx    Stomach cancer Neg Hx     Social History Social History   Tobacco Use   Smoking status: Every Day    Packs/day: 0.50    Years: 30.00    Pack years: 15.00    Types: Cigarettes    Start date: 1984   Smokeless tobacco: Never   Tobacco comments:    10 cig per day  Vaping Use   Vaping Use: Never used  Substance Use Topics   Alcohol use: Yes    Comment: rarely   Drug use: No     Allergies   Penicillins   Review of Systems Review of Systems  Constitutional: Negative.   HENT:  Positive for sinus pressure and sinus pain. Negative for congestion, dental problem, drooling, ear discharge, ear pain, facial swelling, hearing loss, mouth sores, nosebleeds, postnasal drip, rhinorrhea, sneezing, sore throat, tinnitus, trouble swallowing and voice change.   Respiratory: Negative.    Cardiovascular: Negative.   Gastrointestinal: Negative.   Skin: Negative.   Neurological:  Positive for headaches. Negative for dizziness, tremors, seizures, syncope, facial asymmetry, speech  difficulty, weakness, light-headedness and numbness.    Physical Exam Triage Vital Signs ED Triage Vitals  Enc Vitals Group     BP 10/16/21 1256 (!) 180/126     Pulse Rate 10/16/21 1256 79     Resp 10/16/21 1256 19     Temp 10/16/21  1256 98.2 F (36.8 C)     Temp Source 10/16/21 1256 Oral     SpO2 10/16/21 1256 95 %     Weight --      Height --      Head Circumference --      Peak Flow --      Pain Score 10/16/21 1254 7     Pain Loc --      Pain Edu? --      Excl. in GC? --    No data found.  Updated Vital Signs BP (!) 180/126 (BP Location: Left Arm) Comment: no HTN meds today  Pulse 79   Temp 98.2 F (36.8 C) (Oral)   Resp 19   LMP 12/20/2016   SpO2 95%   Visual Acuity Right Eye Distance:   Left Eye Distance:   Bilateral Distance:    Right Eye Near:   Left Eye Near:    Bilateral Near:     Physical Exam Constitutional:      Appearance: Normal appearance.  HENT:     Head: Normocephalic.     Right Ear: Tympanic membrane, ear canal and external ear normal.     Left Ear: Tympanic membrane, ear canal and external ear normal.     Nose:     Right Turbinates: Swollen.     Left Turbinates: Swollen.     Right Sinus: Maxillary sinus tenderness and frontal sinus tenderness present.     Left Sinus: No maxillary sinus tenderness or frontal sinus tenderness.     Mouth/Throat:     Pharynx: Oropharynx is clear.  Eyes:     Extraocular Movements: Extraocular movements intact.  Cardiovascular:     Rate and Rhythm: Normal rate and regular rhythm.     Pulses: Normal pulses.     Heart sounds: Normal heart sounds.  Pulmonary:     Effort: Pulmonary effort is normal.     Breath sounds: Normal breath sounds.  Skin:    General: Skin is warm and dry.  Neurological:     Mental Status: She is alert and oriented to person, place, and time. Mental status is at baseline.  Psychiatric:        Mood and Affect: Mood normal.        Behavior: Behavior normal.     UC Treatments /  Results  Labs (all labs ordered are listed, but only abnormal results are displayed) Labs Reviewed - No data to display  EKG   Radiology No results found.  Procedures Procedures (including critical care time)  Medications Ordered in UC Medications - No data to display  Initial Impression / Assessment and Plan / UC Course  I have reviewed the triage vital signs and the nursing notes.  Pertinent labs & imaging results that were available during my care of the patient were reviewed by me and considered in my medical decision making (see chart for details).  Acute maxillary sinusitis  Vital signs are stable, blood pressure elevated at 180/126, patient endorses that she has not taken daily blood pressure medication today, recommended taking as soon as possible, recommended Flonase, Mucinex for management of symptoms, recommended changing daily antihistamine from Claritin to Allegra or Zyrtec as she endorses that medicine is not always effective, watchful wait antibiotic placed at pharmacy for day 8 of illness, doxycycline 10-day course prescribed as allergy to penicillin confirmed, may follow-up with urgent care as needed for persistent or worsening symptoms Final Clinical Impressions(s) / UC Diagnoses   Final diagnoses:  None   Discharge Instructions   None    ED Prescriptions   None    PDMP not reviewed this encounter.   Valinda Hoar, NP 10/16/21 1349

## 2021-10-16 NOTE — ED Notes (Signed)
Patient states she is leaving d/t wait time 

## 2021-10-16 NOTE — Discharge Instructions (Addendum)
Your symptoms today are consistent with a sinus infection, sinus infections are typically caused by viruses meaning we must give the body time to fight off the infection before attempting use of antibiotics ? ?Therefore please begin the following ? ?Take Flonase every morning, this medication is a steroid nasal spray which helps to loosen secretions out of the sinus passageway as well as to reduce the amount of secretions present ? ?Take Mucinex, this medication helps to thin secretions allowing them to drain ? ?As you have been taking a daily antihistamine for your allergies, if you feel it is ineffective he may switch over to Allegra or Zyrtec ,this medication reduces the amount of secretions that the body will produce ? ?If symptoms have not improved after consistent use of the medicine on Saturday, Oct 19, 2021 there will be an antibiotic at the pharmacy for you, begin use of doxycyline twice daily for 10 days ? ?You may follow-up with urgent care as needed for persisting symptoms  ?

## 2021-10-16 NOTE — ED Triage Notes (Signed)
Pt with increased sinus pressure/pain and looked online and it said if she had a stiff neck as well she needed to come to the ED.  ?

## 2022-09-15 ENCOUNTER — Ambulatory Visit: Payer: BC Managed Care – PPO | Admitting: Family

## 2022-09-15 ENCOUNTER — Encounter: Payer: Self-pay | Admitting: Family

## 2022-09-15 VITALS — BP 140/102 | HR 77 | Temp 97.5°F | Resp 18 | Ht 62.4 in | Wt 290.6 lb

## 2022-09-15 DIAGNOSIS — N979 Female infertility, unspecified: Secondary | ICD-10-CM | POA: Insufficient documentation

## 2022-09-15 DIAGNOSIS — G4733 Obstructive sleep apnea (adult) (pediatric): Secondary | ICD-10-CM | POA: Diagnosis not present

## 2022-09-15 DIAGNOSIS — E782 Mixed hyperlipidemia: Secondary | ICD-10-CM

## 2022-09-15 DIAGNOSIS — Z1159 Encounter for screening for other viral diseases: Secondary | ICD-10-CM

## 2022-09-15 DIAGNOSIS — I10 Essential (primary) hypertension: Secondary | ICD-10-CM | POA: Diagnosis not present

## 2022-09-15 DIAGNOSIS — Z1211 Encounter for screening for malignant neoplasm of colon: Secondary | ICD-10-CM

## 2022-09-15 DIAGNOSIS — Z9884 Bariatric surgery status: Secondary | ICD-10-CM | POA: Diagnosis not present

## 2022-09-15 DIAGNOSIS — F5101 Primary insomnia: Secondary | ICD-10-CM

## 2022-09-15 DIAGNOSIS — M5442 Lumbago with sciatica, left side: Secondary | ICD-10-CM

## 2022-09-15 DIAGNOSIS — Z113 Encounter for screening for infections with a predominantly sexual mode of transmission: Secondary | ICD-10-CM | POA: Diagnosis not present

## 2022-09-15 DIAGNOSIS — F32A Depression, unspecified: Secondary | ICD-10-CM

## 2022-09-15 DIAGNOSIS — Z87891 Personal history of nicotine dependence: Secondary | ICD-10-CM

## 2022-09-15 DIAGNOSIS — Z7689 Persons encountering health services in other specified circumstances: Secondary | ICD-10-CM

## 2022-09-15 DIAGNOSIS — G8929 Other chronic pain: Secondary | ICD-10-CM

## 2022-09-15 DIAGNOSIS — F419 Anxiety disorder, unspecified: Secondary | ICD-10-CM

## 2022-09-15 DIAGNOSIS — Z6841 Body Mass Index (BMI) 40.0 and over, adult: Secondary | ICD-10-CM

## 2022-09-15 DIAGNOSIS — E876 Hypokalemia: Secondary | ICD-10-CM

## 2022-09-15 MED ORDER — AMLODIPINE BESYLATE 10 MG PO TABS
10.0000 mg | ORAL_TABLET | Freq: Every day | ORAL | 1 refills | Status: AC
Start: 2022-09-15 — End: ?

## 2022-09-15 MED ORDER — ATORVASTATIN CALCIUM 40 MG PO TABS
40.0000 mg | ORAL_TABLET | Freq: Every day | ORAL | 1 refills | Status: AC
Start: 2022-09-15 — End: ?

## 2022-09-15 MED ORDER — POTASSIUM CHLORIDE CRYS ER 20 MEQ PO TBCR: 20.0000 meq | EXTENDED_RELEASE_TABLET | Freq: Two times a day (BID) | ORAL | 0 refills | Status: DC

## 2022-09-15 MED ORDER — SERTRALINE HCL 25 MG PO TABS: 25.0000 mg | ORAL_TABLET | Freq: Every day | ORAL | 3 refills | Status: DC

## 2022-09-15 MED ORDER — LOSARTAN POTASSIUM 50 MG PO TABS
50.0000 mg | ORAL_TABLET | Freq: Every day | ORAL | 0 refills | Status: DC
Start: 2022-09-15 — End: 2022-09-22

## 2022-09-15 MED ORDER — TRAZODONE HCL 50 MG PO TABS: 25.0000 mg | ORAL_TABLET | Freq: Every evening | ORAL | 3 refills | Status: DC | PRN

## 2022-09-15 MED ORDER — METOPROLOL TARTRATE 100 MG PO TABS
100.0000 mg | ORAL_TABLET | Freq: Two times a day (BID) | ORAL | 1 refills | Status: AC
Start: 2022-09-15 — End: ?

## 2022-09-15 NOTE — Progress Notes (Signed)
Provider: Marlowe Sax FNP-C   Kathalina Ostermann, Nelda Bucks, NP  Patient Care Team: Nura Cahoon, Nelda Bucks, NP as PCP - General (Family Medicine) Dohmeier, Asencion Partridge, MD as Consulting Physician (Neurology)  Extended Emergency Contact Information Primary Emergency Contact: [facility] Address: [address], [place] [address] [place] Phone: [phone] Mobile Phone: [phone] Relation: Spouse Secondary Emergency Contact: Clyda Hurdle Address: [place] Phone: [phone] Relation: Sister  Code Status:  Full Code  Goals of care: Advanced Directive information    10/16/2021   12:50 AM  Advanced Directives  Does Patient Have a Medical Advance Directive? No     Chief Complaint  Patient presents with   Establish Care    Wants to discuss weightloss. She had lapband but ws told it was in for too long and she needed it out. She needs to start that process. Also is having issues with back was told that her bone spurs would probably grow back. Has vertigo but only when laying down. Is having anxiety and panic attacks at night.     HPI:  Pt is a 57 y.o. female seen today establish care here at [facility] for medical management of chronic diseases.Has a medical History of Hypertension,Hyperlipidemia,RLS,GERD,chronic Kidney disease,Obstructive Sleep Apnea on C-PAP machine ,Generalized anxiety disorder,Osteoarthritis,lower back pain,Obesity,Former cigarette smoker among others.   States has been out of her medication for several years.Has not had any recent blood work.she denies any headache,dizziness,vision changes,fatigue,chest tightness,palpitation,chest pain or shortness of breath.  She request medication for weight loss. States does not exercise.  States has noticed swelling on right previous lap band area.states previous surgeon had told her that she will need lap band removed.would like referral to  general surgery.   Smoked 10 cigarettes per day for 30 yrs.   She does not drink any alcohol.    Past Medical History:  Diagnosis Date   Anxiety    Arthritis    hands   Chronic kidney disease    kidney stones   GERD (gastroesophageal reflux disease)    History of kidney stones 2016   Hyperlipidemia    Hypertension    Obesity    Restless leg syndrome    Sleep apnea    c-pap   Past Surgical History:  Procedure Laterality Date   bone spur removal     left side of lumbar spine   BREAST SURGERY  2000   breast reduccion   COLONOSCOPY     HYSTERECTOMY ABDOMINAL WITH SALPINGECTOMY Bilateral 08/18/2017   Procedure: SUPRACERVICAL HYSTERECTOMY ABDOMINAL;  Surgeon: [name], MD;  Location: [facility];  Service: Gynecology;  Laterality: Bilateral;   LAPAROSCOPIC GASTRIC BANDING  ~ 2010   LYSIS OF ADHESION N/A 08/18/2017   Procedure: LYSIS OF ADHESION;  Surgeon: [name], MD;  Location: [facility];  Service: Gynecology;  Laterality: N/A;   SALPINGOOPHORECTOMY Bilateral 08/18/2017   Procedure: SALPINGO OOPHORECTOMY;  Surgeon: [name], MD;  Location: [facility];  Service: Gynecology;  Laterality: Bilateral;   TUBAL LIGATION  2005    Allergies  Allergen Reactions   Penicillins Other (See Comments)    Yeast infection Has patient had a PCN reaction causing immediate rash, facial/tongue/throat swelling, SOB or lightheadedness with hypotension: No Has patient had a PCN reaction causing severe rash involving mucus membranes or skin necrosis: No Has patient had a PCN reaction that required hospitalization: Unknown Has  patient had a PCN reaction occurring within the last 10 years: No If all of the above answers are "NO", then may proceed with Cephalosporin use.    Allergies as of 09/15/2022       Reactions   Penicillins Other (See Comments)   Yeast infection Has patient had a PCN reaction causing immediate rash, facial/tongue/throat swelling, SOB or lightheadedness with  hypotension: No Has patient had a PCN reaction causing severe rash involving mucus membranes or skin necrosis: No Has patient had a PCN reaction that required hospitalization: Unknown Has patient had a PCN reaction occurring within the last 10 years: No If all of the above answers are "NO", then may proceed with Cephalosporin use.        Medication List        Accurate as of September 15, 2022 11:17 AM. If you have any questions, ask your nurse or doctor.          amLODipine 10 MG tablet Commonly known as: NORVASC Take 10 mg by mouth daily.   atorvastatin 40 MG tablet Commonly known as: LIPITOR Take 1 tablet (40 mg total) by mouth daily.   losartan 50 MG tablet Commonly known as: COZAAR Take 1 tablet (50 mg total) by mouth daily.   meclizine 25 MG tablet Commonly known as: ANTIVERT Take 1 tablet (25 mg total) by mouth 3 (three) times daily as needed for dizziness.   metoprolol tartrate 100 MG tablet Commonly known as: LOPRESSOR Take 1 tablet by mouth 2 hours prior to CT scan   ondansetron 8 MG tablet Commonly known as: Zofran Take 1 tablet (8 mg total) by mouth every 8 (eight) hours as needed for nausea or vomiting.   pantoprazole 40 MG tablet Commonly known as: PROTONIX Take 1 tablet (40 mg total) by mouth 2 (two) times daily.   potassium chloride SA 20 MEQ tablet Commonly known as: KLOR-CON M Take 1 tablet (20 mEq total) by mouth 2 (two) times daily.   sucralfate 1 g tablet Commonly known as: Carafate Take 1 tablet (1 g total) by mouth 4 (four) times daily -  with meals and at bedtime.   zolpidem 10 MG tablet Commonly known as: AMBIEN Take 10 mg by mouth at bedtime as needed for sleep.        Review of Systems  Constitutional:  Negative for appetite change, chills, fatigue, fever and unexpected weight change.  HENT:  Positive for dental problem. Negative for congestion, ear discharge, ear pain, hearing loss, nosebleeds, postnasal drip, rhinorrhea, sinus  pressure, sinus pain, sneezing, sore throat, tinnitus and trouble swallowing.        Dentures   Eyes:  Negative for pain, discharge, redness, itching and visual disturbance.       Wears corrective lens  Dry eyes   Respiratory:  Negative for cough, chest tightness, shortness of breath and wheezing.   Cardiovascular:  Negative for chest pain, palpitations and leg swelling.  Gastrointestinal:  Negative for abdominal distention, abdominal pain, blood in stool, constipation, diarrhea, nausea and vomiting.  Endocrine: Negative for cold intolerance, heat intolerance, polydipsia, polyphagia and polyuria.       Hot flushes   Genitourinary:  Negative for difficulty urinating, dysuria, flank pain, frequency and urgency.  Musculoskeletal:  Positive for arthralgias and back pain. Negative for gait problem, joint swelling, myalgias, neck pain and neck stiffness.  Skin:  Negative for color change, pallor, rash and wound.  Neurological:  Negative for dizziness, syncope, speech difficulty, weakness, light-headedness, numbness and  headaches.  Hematological:  Does not bruise/bleed easily.  Psychiatric/Behavioral:  Positive for sleep disturbance. Negative for agitation, behavioral problems, confusion, hallucinations, self-injury and suicidal ideas. The patient is nervous/anxious.        Increased stress     Immunization History  Administered Date(s) Administered   Influenza Whole 07/01/2007   Influenza,inj,Quad PF,6+ Mos 04/22/2012   Tdap 02/22/2020   Pertinent  Health Maintenance Due  Topic Date Due   COLONOSCOPY (Pts 45-56yrs Insurance coverage will need to be confirmed)  11/17/2017   MAMMOGRAM  12/28/2022   INFLUENZA VACCINE  01/15/2023   PAP SMEAR-Modifier  12/20/2023      05/03/2020    4:30 AM 05/06/2020    1:00 PM 10/16/2021   12:50 AM 10/16/2021   12:55 PM 09/15/2022   11:02 AM  Fall Risk  Falls in the past year?     0  Was there an injury with Fall?     0  Fall Risk Category Calculator     0   (RETIRED) Patient Fall Risk Level Low fall risk Low fall risk Low fall risk Low fall risk   Patient at Risk for Falls Due to     No Fall Risks   Functional Status Survey:    Vitals:   09/15/22 1105  BP: (!) 140/102  Pulse: 77  Resp: 18  Temp: (!) 97.5 F (36.4 C)  TempSrc: Temporal  SpO2: 99%  Weight: 290 lb 9.6 oz (131.8 kg)  Height: 5' 2.4" (1.585 m)   Body mass index is 52.47 kg/m. Physical Exam Vitals reviewed.  Constitutional:      General: She is not in acute distress.    Appearance: Normal appearance. She is morbidly obese. She is not ill-appearing or diaphoretic.  HENT:     Head: Normocephalic.     Right Ear: Tympanic membrane, ear canal and external ear normal. There is no impacted cerumen.     Left Ear: Tympanic membrane, ear canal and external ear normal. There is no impacted cerumen.     Nose: Nose normal. No congestion or rhinorrhea.     Mouth/Throat:     Mouth: Mucous membranes are moist.     Pharynx: Oropharynx is clear. No oropharyngeal exudate or posterior oropharyngeal erythema.  Eyes:     General: No scleral icterus.       Right eye: No discharge.        Left eye: No discharge.     Extraocular Movements: Extraocular movements intact.     Conjunctiva/sclera: Conjunctivae normal.     Pupils: Pupils are equal, round, and reactive to light.     Comments: Corrective lens   Neck:     Vascular: No carotid bruit.  Cardiovascular:     Rate and Rhythm: Normal rate and regular rhythm.     Pulses: Normal pulses.     Heart sounds: Normal heart sounds. No murmur heard.    No friction rub. No gallop.  Pulmonary:     Effort: Pulmonary effort is normal. No respiratory distress.     Breath sounds: Normal breath sounds. No wheezing, rhonchi or rales.  Chest:     Chest wall: No tenderness.  Abdominal:     General: Bowel sounds are normal. There is no distension.     Palpations: Abdomen is soft. There is no mass.     Tenderness: There is no abdominal  tenderness. There is no right CVA tenderness, left CVA tenderness, guarding or rebound.  Musculoskeletal:  General: No swelling or tenderness. Normal range of motion.     Cervical back: Normal range of motion. No rigidity or tenderness.     Right lower leg: No edema.     Left lower leg: No edema.  Lymphadenopathy:     Cervical: No cervical adenopathy.  Skin:    General: Skin is warm and dry.     Coloration: Skin is not pale.     Findings: No bruising, erythema, lesion or rash.  Neurological:     Mental Status: She is alert and oriented to person, place, and time.     Cranial Nerves: No cranial nerve deficit.     Sensory: No sensory deficit.     Motor: No weakness.     Coordination: Coordination normal.     Gait: Gait normal.  Psychiatric:        Mood and Affect: Mood normal.        Speech: Speech normal.        Behavior: Behavior normal.        Thought Content: Thought content normal.        Judgment: Judgment normal.     Labs reviewed: No results for input(s): "NA", "K", "CL", "CO2", "GLUCOSE", "BUN", "CREATININE", "CALCIUM", "MG", "PHOS" in the last 8760 hours. No results for input(s): "AST", "ALT", "ALKPHOS", "BILITOT", "PROT", "ALBUMIN" in the last 8760 hours. No results for input(s): "WBC", "NEUTROABS", "HGB", "HCT", "MCV", "PLT" in the last 8760 hours. Lab Results  Component Value Date   TSH 1.29 10/25/2019   Lab Results  Component Value Date   HGBA1C 5.8 10/26/2019   Lab Results  Component Value Date   CHOL 204 (H) 05/18/2020   HDL 55 05/18/2020   LDLCALC 136 (H) 05/18/2020   TRIG 73 05/18/2020   CHOLHDL 3.7 05/18/2020    Significant Diagnostic Results in last 30 days:  No results found.  Assessment/Plan  1. Encounter to establish care Available record reviewed recommended fasting labs.Up to date on immunization.    2. Essential hypertension B/p uncontrolled has been out of medication for several years. Will refill medication.  - losartan  (COZAAR) 50 MG tablet; Take 1 tablet (50 mg total) by mouth daily.  Dispense: 15 tablet; Refill: 0 - Lipid panel - TSH - COMPLETE METABOLIC PANEL WITH GFR - CBC with Differential/Platelet  3. OBSTRUCTIVE SLEEP APNEA Continue on C-PAP machine   4. History of laparoscopic adjustable gastric banding, APL.  10/23/2008. Reports increased swelling on lap band site.states was previous Told need removable. - Ambulatory referral to General Surgery  5. Chronic bilateral low back pain with left-sided sciatica Chronic but has had worsening pain.  - Ambulatory referral to Orthopedic Surgery  6. Colon cancer screening Asymptomatic  - Ambulatory referral to Gastroenterology  7. Primary insomnia Start on Trazodone  - traZODone (DESYREL) 50 MG tablet; Take 0.5-1 tablets (25-50 mg total) by mouth at bedtime as needed for sleep.  Dispense: 30 tablet; Refill: 3  8. Hypokalemia Recheck Potassium level  - potassium chloride SA (KLOR-CON M) 20 MEQ tablet; Take 1 tablet (20 mEq total) by mouth 2 (two) times daily.  Dispense: 10 tablet; Refill: 0  9. Anxiety and depression Worsening symptoms.would like referral to Psychiatry and counseling - agree to start on sertraline 25 mg tablet daily  - Ambulatory referral to Psychiatry  10. Former cigarette smoker Has quit smoking smoked 10 cigarettes per day for 30 yrs.   11. Encounter for hepatitis C screening test for low risk patient Low risk  -  Hepatitis C antibody  12. Screen for STD (sexually transmitted disease) No high risk behaviors  - HIV Antibody (routine testing w rflx)  13. Mixed hyperlipidemia No recent LDL for review  - Lipid panel  14. Morbid obesity - request weight loss medication  Will obtain lab work then start on [place] or [place]. - dietary modification and exercise  at least three times per week for 30 minutes.   15. Adult BMI 50.0-59.9 kg/sq m BMI 52.47  - dietary modification and exercise as above.   Family/ staff  Communication: Reviewed plan of care with patient verbalized understanding.   Labs/tests ordered:  - CBC with Differential/Platelet - CMP with eGFR([facility]) - TSH - Lipid panel - Hep C Antibody - HIV Antibody (routine testing w rflx)  Next Appointment : Return in about 2 weeks (around 09/29/2022) for Blood pressure,Anxierty /depression and weight gain .   Sandrea Hughs, NP

## 2022-09-16 LAB — COMPLETE METABOLIC PANEL WITH GFR
AG Ratio: 1.2 (calc) (ref 1.0–2.5)
ALT: 17 U/L (ref 6–29)
AST: 16 U/L (ref 10–35)
Albumin: 4.2 g/dL (ref 3.6–5.1)
Alkaline phosphatase (APISO): 76 U/L (ref 37–153)
BUN: 11 mg/dL (ref 7–25)
CO2: 29 mmol/L (ref 20–32)
Calcium: 9.7 mg/dL (ref 8.6–10.4)
Chloride: 107 mmol/L (ref 98–110)
Creat: 0.78 mg/dL (ref 0.50–1.03)
Globulin: 3.6 g/dL (calc) (ref 1.9–3.7)
Glucose, Bld: 105 mg/dL — ABNORMAL HIGH (ref 65–99)
Potassium: 4.2 mmol/L (ref 3.5–5.3)
Sodium: 142 mmol/L (ref 135–146)
Total Bilirubin: 0.4 mg/dL (ref 0.2–1.2)
Total Protein: 7.8 g/dL (ref 6.1–8.1)
eGFR: 90 mL/min/{1.73_m2} (ref >=60)

## 2022-09-16 LAB — CBC WITH DIFFERENTIAL/PLATELET
Absolute Monocytes: 462 cells/uL (ref 200–950)
Basophils Absolute: 91 cells/uL (ref 0–200)
Basophils Relative: 1.3 %
Eosinophils Absolute: 224 cells/uL (ref 15–500)
Eosinophils Relative: 3.2 %
HCT: 43.8 % (ref 35.0–45.0)
Hemoglobin: 14.1 g/dL (ref 11.7–15.5)
Lymphs Abs: 3381 cells/uL (ref 850–3900)
MCH: 29.3 pg (ref 27.0–33.0)
MCHC: 32.2 g/dL (ref 32.0–36.0)
MCV: 91.1 fL (ref 80.0–100.0)
MPV: 10.8 fL (ref 7.5–12.5)
Monocytes Relative: 6.6 %
Neutro Abs: 2842 cells/uL (ref 1500–7800)
Neutrophils Relative %: 40.6 %
Platelets: 317 10*3/uL (ref 140–400)
RBC: 4.81 10*6/uL (ref 3.80–5.10)
RDW: 13.2 % (ref 11.0–15.0)
Total Lymphocyte: 48.3 %
WBC: 7 10*3/uL (ref 3.8–10.8)

## 2022-09-16 LAB — HEPATITIS C ANTIBODY: Hepatitis C Ab: NONREACTIVE

## 2022-09-16 LAB — LIPID PANEL
Cholesterol: 186 mg/dL (ref <200)
HDL: 45 mg/dL — ABNORMAL LOW (ref >=50)
LDL Cholesterol (Calc): 121 mg/dL (calc) — ABNORMAL HIGH
Non-HDL Cholesterol (Calc): 141 mg/dL (calc) — ABNORMAL HIGH (ref <130)
Total CHOL/HDL Ratio: 4.1 (calc) (ref <5.0)
Triglycerides: 101 mg/dL (ref <150)

## 2022-09-16 LAB — TSH: TSH: 1.94 mIU/L

## 2022-09-16 LAB — HIV ANTIBODY (ROUTINE TESTING W REFLEX): HIV 1&2 Ab, 4th Generation: NONREACTIVE

## 2022-09-22 ENCOUNTER — Encounter: Payer: Self-pay | Admitting: Orthopedic Surgery

## 2022-09-22 ENCOUNTER — Other Ambulatory Visit: Payer: Self-pay | Admitting: Family

## 2022-09-22 ENCOUNTER — Ambulatory Visit (INDEPENDENT_AMBULATORY_CARE_PROVIDER_SITE_OTHER): Payer: Self-pay | Admitting: Orthopedic Surgery

## 2022-09-22 ENCOUNTER — Other Ambulatory Visit (INDEPENDENT_AMBULATORY_CARE_PROVIDER_SITE_OTHER): Payer: BC Managed Care – PPO

## 2022-09-22 VITALS — BP 129/82 | HR 73 | Ht 62.0 in | Wt 291.0 lb

## 2022-09-22 DIAGNOSIS — M545 Low back pain, unspecified: Secondary | ICD-10-CM

## 2022-09-22 DIAGNOSIS — I10 Essential (primary) hypertension: Secondary | ICD-10-CM

## 2022-09-22 NOTE — Progress Notes (Signed)
Orthopedic Spine Surgery Office Note  Assessment: Patient is a 57 y.o. female with chronic, progressive low back pain   Plan: -Explained that initially conservative treatment is tried as a significant number of patients may experience relief with these treatment modalities. Discussed that the conservative treatments include:  -activity modification  -physical therapy  -over the counter pain medications  -medrol dosepak  -lumbar steroid injections -Patient has tried activity modification, heat  -Recommended weight loss and core strengthening. Referral provided to PT -Patient would need to get down to a weight of 220 before elective surgery would be considered as a treatment option -Patient should return to office in 8 weeks, x-rays at next visit: none   Patient expressed understanding of the plan and all questions were answered to the patient's satisfaction.   ___________________________________________________________________________   History:  Patient is a 57 y.o. female who presents today for lumbar spine. Patient comes in with about 1.5 years of low back pain that has been getting progressively worse with time. It is felt in the lower lumbar spine along the midline. She has left anterolateral thigh pain and numbness that started after a hysterectomy. No other pain radiating into the lower extremities. Pain has been getting progressively worse with time. She feels like her spine is being compressed. It is worse if she is standing or sitting for a long time.    Weakness: denies Symptoms of imbalance: denies Paresthesias and numbness: yes, numb in her anterolateral left thigh. No other numbness or paresthesias Bowel or bladder incontinence: denies Saddle anesthesia: denies  Treatments tried: activity modification, heat  Review of systems: Denies fevers and chills, night sweats, unexplained weight loss, history of cancer, pain that wakes them at night  Past medical  history: Depression/anxiety HTN OSA Chronic pain Vertigo GERD HLD  Allergies: penicillin  Past surgical history:  Hysterectomy Breast reduction Gastric band Lysis of adhesions Salpingoophorectomy Tubal ligation  Social history: Denies use of nicotine product (smoking, vaping, patches, smokeless) Alcohol use: denies Denies recreational drug use   Physical Exam:  General: no acute distress, appears stated age Neurologic: alert, answering questions appropriately, following commands Respiratory: unlabored breathing on room air, symmetric chest rise Psychiatric: appropriate affect, normal cadence to speech   MSK (spine):  -Strength exam      Left  Right EHL    5/5  5/5 TA    5/5  5/5 GSC    5/5  5/5 Knee extension  5/5  5/5 Hip flexion   5/5  5/5  -Sensory exam    Sensation intact to light touch in L3-S1 nerve distributions of bilateral lower extremities  -Achilles DTR: 2/4 on the left, 2/4 on the right -Patellar tendon DTR: 2/4 on the left, 2/4 on the right  -Straight leg raise: negative bilaterally -Femoral nerve stretch test: negative bilaterally -Clonus: no beats bilaterally  -Left hip exam: negative stinchfield, negative faber, no pain through range of motion -Right hip exam: no pain through range of motion, negative stinchfield, negative faber  Imaging: XR of the lumbar spine from 09/22/2022 was independently reviewed and interpreted, showing anterior osteophyte formation at L4/5. No other significant degenerative changes. No evidence of instability on flexion/extension. No fracture or dislocation.    Patient name: Kaitlyn Whitney Patient MRN: [id number] Date of visit: 09/22/22

## 2022-09-29 ENCOUNTER — Ambulatory Visit: Payer: BC Managed Care – PPO | Admitting: Family

## 2022-09-29 ENCOUNTER — Encounter: Payer: Self-pay | Admitting: Family

## 2022-09-29 VITALS — BP 118/78 | HR 77 | Temp 97.8°F | Resp 16 | Ht 62.0 in | Wt 291.0 lb

## 2022-09-29 DIAGNOSIS — Z6841 Body Mass Index (BMI) 40.0 and over, adult: Secondary | ICD-10-CM

## 2022-09-29 DIAGNOSIS — R739 Hyperglycemia, unspecified: Secondary | ICD-10-CM | POA: Diagnosis not present

## 2022-09-29 DIAGNOSIS — F419 Anxiety disorder, unspecified: Secondary | ICD-10-CM

## 2022-09-29 DIAGNOSIS — F32A Depression, unspecified: Secondary | ICD-10-CM

## 2022-09-29 LAB — HEMOGLOBIN A1C
Hgb A1c MFr Bld: 6.2 % of total Hgb — ABNORMAL HIGH (ref <5.7)
Mean Plasma Glucose: 131 mg/dL
eAG (mmol/L): 7.3 mmol/L

## 2022-09-29 MED ORDER — SEMAGLUTIDE(0.25 OR 0.5MG/DOS) 2 MG/3ML ~~LOC~~ SOPN
0.5000 mg | PEN_INJECTOR | SUBCUTANEOUS | 0 refills | Status: DC
Start: 2022-09-29 — End: 2022-10-29

## 2022-09-29 MED ORDER — SERTRALINE HCL 50 MG PO TABS
50.0000 mg | ORAL_TABLET | Freq: Every day | ORAL | 1 refills | Status: DC
Start: 2022-09-29 — End: 2023-03-31

## 2022-09-29 NOTE — Progress Notes (Signed)
Provider: Richarda Blade FNP-C  Chadric Kimberley, Donalee Citrin, NP  Patient Care Team: Joey Lierman, Donalee Citrin, NP as PCP - General (Family Medicine) Dohmeier, Porfirio Mylar, MD as Consulting Physician (Neurology)  Extended Emergency Contact Information Primary Emergency Contact: [facility] Address: [address]          [place], [place] [address] [place] Home Phone: [phone] Mobile Phone: [phone] Relation: Spouse Secondary Emergency Contact: Dema Severin Address: [place] Home Phone: [phone] Relation: Sister  Code Status:  Full Code  Goals of care: Advanced Directive information    10/16/2021   12:50 AM  Advanced Directives  Does Patient Have a Medical Advance Directive? No     Chief Complaint  Patient presents with   Medical Management of Chronic Issues    2 week fu anxiety, bp and weight.  Doing pretty good. Has list of a couple BP    HPI:  Pt is a 57 y.o. female seen today for an acute visit for 2 weeks follow up for anxiety ,blood pressure check and discuss weight loss.she was started on sertraline 2 weeks ago.states symptoms have improved but has panic attack especially at bedtime.usually takes deep breathing which helps.   Bp was elevated 140/102 on last visit had been out of her losartan for several years.Losartan was refilled and has been taking as directed.she brought in her home B/p readings runs in the 110's/60's - 130's/80's  with x 1 reading 140/92  She denies any headache,dizziness,vision changes,fatigue,chest tightness,palpitation,chest pain or shortness of breath.    States would like to start on weight loss medication.  Recent lab work reviewed were unremarkable except LDL cholesterol and glucose were high.Would like to start on Ozempic.states no family history of thyroid nodule,goiter or cancer in the family.   Past Medical History:  Diagnosis Date   Anxiety    Arthritis    hands   Chronic kidney disease     kidney stones   GERD (gastroesophageal reflux disease)    History of kidney stones 2016   Hyperlipidemia    Hypertension    Obesity    Restless leg syndrome    Sleep apnea    c-pap   Past Surgical History:  Procedure Laterality Date   bone spur removal     left side of lumbar spine   BREAST SURGERY  2000   breast reduccion   COLONOSCOPY     HYSTERECTOMY ABDOMINAL WITH SALPINGECTOMY Bilateral 08/18/2017   Procedure: SUPRACERVICAL HYSTERECTOMY ABDOMINAL;  Surgeon: Hermina Staggers, MD;  Location: [facility];  Service: Gynecology;  Laterality: Bilateral;   LAPAROSCOPIC GASTRIC BANDING  ~ 2010   LYSIS OF ADHESION N/A 08/18/2017   Procedure: LYSIS OF ADHESION;  Surgeon: Hermina Staggers, MD;  Location: [facility];  Service: Gynecology;  Laterality: N/A;   SALPINGOOPHORECTOMY Bilateral 08/18/2017   Procedure: SALPINGO OOPHORECTOMY;  Surgeon: Hermina Staggers, MD;  Location: [facility];  Service: Gynecology;  Laterality: Bilateral;   TUBAL LIGATION  2005    Allergies  Allergen Reactions   Penicillins Other (See Comments)    Yeast infection Has patient had a PCN reaction causing immediate rash, facial/tongue/throat swelling, SOB or lightheadedness with hypotension: No Has patient had a PCN reaction causing severe rash involving mucus membranes or skin necrosis: No Has patient had a PCN reaction that required hospitalization: Unknown Has patient had a PCN reaction occurring within the last 10 years: No If all of the above answers are "NO", then may proceed with  Cephalosporin use.    Outpatient Encounter Medications as of 09/29/2022  Medication Sig   amLODipine (NORVASC) 10 MG tablet Take 1 tablet (10 mg total) by mouth daily.   atorvastatin (LIPITOR) 40 MG tablet Take 1 tablet (40 mg total) by mouth daily.   losartan (COZAAR) 50 MG tablet TAKE 1 TABLET BY MOUTH EVERY DAY   metoprolol tartrate (LOPRESSOR) 100 MG tablet Take 1 tablet (100 mg total) by mouth 2 (two) times daily. Take 1 tablet by mouth 2  hours prior to CT scan   Misc Natural Products (ELDERBERRY IMMUNE COMPLEX) CHEW Chew 1 tablet by mouth.   potassium chloride SA (KLOR-CON M) 20 MEQ tablet Take 1 tablet (20 mEq total) by mouth 2 (two) times daily.   traZODone (DESYREL) 50 MG tablet Take 0.5-1 tablets (25-50 mg total) by mouth at bedtime as needed for sleep.   [DISCONTINUED] sertraline (ZOLOFT) 25 MG tablet Take 1 tablet (25 mg total) by mouth daily.   sertraline (ZOLOFT) 50 MG tablet Take 1 tablet (50 mg total) by mouth daily.   No facility-administered encounter medications on file as of 09/29/2022.    Review of Systems  Constitutional:  Negative for appetite change, chills, fatigue, fever and unexpected weight change.  HENT:  Negative for hearing loss, nosebleeds, postnasal drip, rhinorrhea, sinus pressure, sinus pain, sneezing, sore throat, tinnitus and trouble swallowing.   Eyes:  Negative for pain, discharge, redness, itching and visual disturbance.  Respiratory:  Negative for cough, chest tightness, shortness of breath and wheezing.   Cardiovascular:  Negative for chest pain, palpitations and leg swelling.  Gastrointestinal:  Negative for abdominal distention, abdominal pain, blood in stool, constipation, diarrhea, nausea and vomiting.  Endocrine: Negative for cold intolerance, heat intolerance, polydipsia, polyphagia and polyuria.  Genitourinary:  Negative for difficulty urinating, dysuria, flank pain, frequency and urgency.  Musculoskeletal:  Negative for arthralgias, back pain, gait problem, joint swelling, myalgias, neck pain and neck stiffness.  Skin:  Negative for color change, pallor, rash and wound.  Neurological:  Negative for dizziness, syncope, speech difficulty, weakness, light-headedness, numbness and headaches.  Hematological:  Does not bruise/bleed easily.  Psychiatric/Behavioral:  Negative for agitation, behavioral problems, confusion, hallucinations, self-injury, sleep disturbance and suicidal ideas. The  patient is nervous/anxious.     Immunization History  Administered Date(s) Administered   Influenza Whole 07/01/2007   Influenza,inj,Quad PF,6+ Mos 04/22/2012   Tdap 02/22/2020   Pertinent  Health Maintenance Due  Topic Date Due   COLONOSCOPY (Pts 45-52yrs Insurance coverage will need to be confirmed)  11/17/2017   MAMMOGRAM  12/28/2022   INFLUENZA VACCINE  01/15/2023   PAP SMEAR-Modifier  12/20/2023      05/03/2020    4:30 AM 05/06/2020    1:00 PM 10/16/2021   12:50 AM 10/16/2021   12:55 PM 09/15/2022   11:02 AM  Fall Risk  Falls in the past year?     0  Was there an injury with Fall?     0  Fall Risk Category Calculator     0  (RETIRED) Patient Fall Risk Level Low fall risk Low fall risk Low fall risk Low fall risk   Patient at Risk for Falls Due to     No Fall Risks   Functional Status Survey:    Vitals:   09/29/22 1100  BP: 118/78  Pulse: 77  Resp: 16  Temp: 97.8 F (36.6 C)  TempSrc: Temporal  SpO2: 96%  Weight: 291 lb (132 kg)  Height:  (1.575  m)   Body mass index is 53.22 kg/m. Physical Exam Vitals reviewed.  Constitutional:      General: She is not in acute distress.    Appearance: Normal appearance. She is normal weight. She is not ill-appearing or diaphoretic.  HENT:     Head: Normocephalic.  Eyes:     General: No scleral icterus.       Right eye: No discharge.        Left eye: No discharge.     Extraocular Movements: Extraocular movements intact.     Conjunctiva/sclera: Conjunctivae normal.     Pupils: Pupils are equal, round, and reactive to light.  Neck:     Vascular: No carotid bruit.  Cardiovascular:     Rate and Rhythm: Normal rate and regular rhythm.     Pulses: Normal pulses.     Heart sounds: Normal heart sounds. No murmur heard.    No friction rub. No gallop.  Pulmonary:     Effort: Pulmonary effort is normal. No respiratory distress.     Breath sounds: Normal breath sounds. No wheezing, rhonchi or rales.  Chest:     Chest  wall: No tenderness.  Abdominal:     General: Bowel sounds are normal. There is no distension.     Palpations: Abdomen is soft. There is no mass.     Tenderness: There is no abdominal tenderness. There is no right CVA tenderness, left CVA tenderness, guarding or rebound.  Musculoskeletal:        General: No swelling or tenderness. Normal range of motion.     Cervical back: Normal range of motion. No rigidity or tenderness.     Right lower leg: No edema.     Left lower leg: No edema.  Lymphadenopathy:     Cervical: No cervical adenopathy.  Skin:    General: Skin is warm and dry.     Coloration: Skin is not pale.     Findings: No bruising, erythema, lesion or rash.  Neurological:     Mental Status: She is alert and oriented to person, place, and time.     Cranial Nerves: No cranial nerve deficit.     Sensory: No sensory deficit.     Motor: No weakness.     Coordination: Coordination normal.     Gait: Gait normal.  Psychiatric:        Mood and Affect: Mood normal.        Speech: Speech normal.        Behavior: Behavior normal.        Thought Content: Thought content normal.        Judgment: Judgment normal.     Labs reviewed: Recent Labs    09/15/22 1157  NA 142  K 4.2  CL 107  CO2 29  GLUCOSE 105*  BUN 11  CREATININE 0.78  CALCIUM 9.7   Recent Labs    09/15/22 1157  AST 16  ALT 17  BILITOT 0.4  PROT 7.8   Recent Labs    09/15/22 1157  WBC 7.0  NEUTROABS 2,842  HGB 14.1  HCT 43.8  MCV 91.1  PLT 317   Lab Results  Component Value Date   TSH 1.94 09/15/2022   Lab Results  Component Value Date   HGBA1C 5.8 10/26/2019   Lab Results  Component Value Date   CHOL 186 09/15/2022   HDL 45 (L) 09/15/2022   LDLCALC 121 (H) 09/15/2022   TRIG 101 09/15/2022   CHOLHDL 4.1 09/15/2022  Significant Diagnostic Results in last 30 days:  No results found.  Assessment/Plan 1. Anxiety and depression Has slightly improved was seen by psychiatry one week  ago dose was increased to 50 mg tablet  -Continue with sertraline 50 mg tablet daily -Continue to follow-up with psychiatry as scheduled -Notify providers if symptoms worsen or go to the ED - sertraline (ZOLOFT) 50 MG tablet; Take 1 tablet (50 mg total) by mouth daily.  Dispense: 90 tablet; Refill: 1  2. Hyperglycemia Glucose was 105 -Dietary modification and exercise advised - Hemoglobin A1c  Family/ staff Communication: Reviewed plan of care with patient verbalized understanding  Labs/tests ordered: - Hemoglobin A1c  Next Appointment: Return in about 1 month (around 10/29/2022) for weight management .   Caesar Bookman, NP

## 2022-10-01 ENCOUNTER — Telehealth: Payer: Self-pay

## 2022-10-01 NOTE — Telephone Encounter (Signed)
Recent lab results indicated prediabetes.

## 2022-10-01 NOTE — Telephone Encounter (Signed)
Notice of adverse benefit determination received from [employer] indicating they are unable to to approval prior authorization as coverage for ozempic is provided for a diagnosis of diabetes mellitus type 2, a copy of the report is available under the media tab and will be sent to the patient as well.  Please advise

## 2022-10-01 NOTE — Telephone Encounter (Signed)
Incoming fax received to initiate a prior authorization for ozempic. Attached was the PA form from [employer]. Form completed, stamped with providers signature and placed in the outgoing fax bin for the administrative staff to further process.   Awaiting reply from insurance company

## 2022-10-02 ENCOUNTER — Telehealth: Payer: Self-pay

## 2022-10-02 NOTE — Telephone Encounter (Signed)
Message was sent after hours, will forward to medical assistant handling clinical intake

## 2022-10-02 NOTE — Telephone Encounter (Signed)
PA was denied

## 2022-10-02 NOTE — Telephone Encounter (Signed)
PA request received for ozempic. PA started through [employer] and waiting on reply.

## 2022-10-07 ENCOUNTER — Other Ambulatory Visit: Payer: Self-pay | Admitting: Family

## 2022-10-07 DIAGNOSIS — F5101 Primary insomnia: Secondary | ICD-10-CM

## 2022-10-07 NOTE — Telephone Encounter (Signed)
Notify patient of denial for coverage.

## 2022-10-07 NOTE — Telephone Encounter (Addendum)
PA resubmitted with prediabetes diagnosis and was once again denied. Denial letter is under media tab. Medication will only be approved with a diagnosis of Diabetes Mellitus Type 2

## 2022-10-07 NOTE — Telephone Encounter (Signed)
Patient is requesting a 90 day supply.  Pended Rx and sent to [facility] for approval.

## 2022-10-09 NOTE — Therapy (Addendum)
OUTPATIENT PHYSICAL THERAPY THORACOLUMBAR EVALUATION   /DISCHARGE   Patient Name: Kaitlyn Whitney MRN: [id number] DOB:[birth year], 57 y.o., female Today's Date: 10/10/2022  END OF SESSION:  PT End of Session - 10/10/22 1426     Visit Number 1    Number of Visits 12    Date for PT Re-Evaluation 12/05/22    Authorization - Visit Number 25    Progress Note Due on Visit 12    PT Start Time 1100    PT Stop Time 1145    PT Time Calculation (min) 45 min    Activity Tolerance Patient tolerated treatment well;No increased pain    Behavior During Therapy [facility] for tasks assessed/performed             Past Medical History:  Diagnosis Date   Anxiety    Arthritis    hands   Chronic kidney disease    kidney stones   GERD (gastroesophageal reflux disease)    History of kidney stones 2016   Hyperlipidemia    Hypertension    Obesity    Restless leg syndrome    Sleep apnea    c-pap   Past Surgical History:  Procedure Laterality Date   bone spur removal     left side of lumbar spine   BREAST SURGERY  2000   breast reduccion   COLONOSCOPY     HYSTERECTOMY ABDOMINAL WITH SALPINGECTOMY Bilateral 08/18/2017   Procedure: SUPRACERVICAL HYSTERECTOMY ABDOMINAL;  Surgeon: Hermina Staggers, MD;  Location: [facility];  Service: Gynecology;  Laterality: Bilateral;   LAPAROSCOPIC GASTRIC BANDING  ~ 2010   LYSIS OF ADHESION N/A 08/18/2017   Procedure: LYSIS OF ADHESION;  Surgeon: Hermina Staggers, MD;  Location: [facility];  Service: Gynecology;  Laterality: N/A;   SALPINGOOPHORECTOMY Bilateral 08/18/2017   Procedure: SALPINGO OOPHORECTOMY;  Surgeon: Hermina Staggers, MD;  Location: [facility];  Service: Gynecology;  Laterality: Bilateral;   TUBAL LIGATION  2005   Patient Active Problem List   Diagnosis Date Noted   Infertility, female 09/15/2022   Hyperlipidemia 05/18/2020   Morbid obesity (HCC) 05/18/2020   Angina pectoris (HCC) 05/18/2020   Annual physical exam 02/23/2020   PCP NOTES >>>>>>>>>>>>>>  10/26/2019   Neuralgia of groin 11/30/2017   Neuropathy 09/23/2017   Class 3 severe obesity with body mass index (BMI) of 45.0 to 49.9 in adult (HCC) 07/22/2017   Other insomnia 07/22/2017   Essential hypertension 02/27/2015   History of laparoscopic adjustable gastric banding, APL.  10/23/2008. 09/02/2012   MERALGIA PARESTHETICA 03/07/2008   DEPRESSION 07/01/2007   OBSTRUCTIVE SLEEP APNEA 07/01/2007    PCP: Caesar Bookman, NP  REFERRING PROVIDER: London Sheer, MD  REFERRING DIAG:  Diagnosis  M54.50 (ICD-10-CM) - Low back pain, unspecified back pain laterality, unspecified chronicity, unspecified whether sciatica present    Rationale for Evaluation and Treatment: Rehabilitation  THERAPY DIAG:  Abnormal posture - Plan: PT plan of care cert/re-cert  Difficulty in walking, not elsewhere classified - Plan: PT plan of care cert/re-cert  Muscle weakness (generalized) - Plan: PT plan of care cert/re-cert  Other low back pain - Plan: PT plan of care cert/re-cert  ONSET DATE: 1.5 year duration   SUBJECTIVE:  SUBJECTIVE STATEMENT: Prolonged standing and walking are difficult for Nima.  She feels pressure in her back that makes her want to sit.  She has left > right numbness of the lateral thigh.  She can stand for about 10 minutes now.  Walking is limited to slightly less than 10 minutes.  PERTINENT HISTORY:  OA, chronic kidney disease, HLD, HTN, obesity, restless leg syndrome, previous lumbar surgery (bone spur removal)  PAIN:  Are you having pain? Yes: NPRS scale: 0-5/10 low back and lateral thigh pain Can be 7/10 Pain location: Lower back Pain description: Stabbing with legs, pressure and heaviness with the back Aggravating factors: Weight-bearing Relieving factors: Sit or lie down, tylenol  at night on occasion  PRECAUTIONS: Back  WEIGHT BEARING RESTRICTIONS: No  FALLS:  Has patient fallen in last 6 months? No  LIVING ENVIRONMENT: Lives with: lives with their family and lives with their spouse Lives in: House/apartment Stairs:  Needs handrails and descends one at a time Has following equipment at home: None  OCCUPATION: Not working, reads, watches movies, just started some virtual exercises  PLOF:  Independent, but she finds herself having to sit for longer periods of time  PATIENT GOALS: Be able to stand and walk for longer periods of time without pain.  Be able to do house chores without having to sit most of the time.  NEXT MD VISIT: ?  OBJECTIVE:   DIAGNOSTIC FINDINGS:  XR of the lumbar spine from 09/22/2022 was independently reviewed and interpreted, showing anterior osteophyte formation at L4/5. No other significant degenerative changes. No evidence of instability on flexion/extension. No fracture or dislocation.   PATIENT SURVEYS:  FOTO 47 (Goal 56 in 12 visits)  SCREENING FOR RED FLAGS: Bowel or bladder incontinence: No Spinal tumors: No Cauda equina syndrome: No Compression fracture: No  COGNITION: Overall cognitive status: Within functional limits for tasks assessed     SENSATION: Bilateral lateral thigh sharp, hot, stabbing on a daily basis (post-hysterectomy)   MUSCLE LENGTH: Hamstrings: Right 35 deg; Left 35 deg  POSTURE: rounded shoulders, forward head, and decreased lumbar lordosis  LUMBAR ROM:   AROM 10/10/2022  Flexion   Extension < 5  Right lateral flexion   Left lateral flexion   Right rotation   Left rotation    (Blank rows = not tested)  LOWER EXTREMITY ROM:     Passive  Left/Right in degrees 10/10/2022   Hip flexion 80/75   Hip extension    Hip abduction    Hip adduction    Hip internal rotation 6/7   Hip external rotation 26/27   Knee flexion    Knee extension    Ankle dorsiflexion    Ankle plantarflexion     Ankle inversion    Ankle eversion     (Blank rows = not tested)  LOWER EXTREMITY STRENGTH:    MMT Left/Right 10/10/2022   Hip flexion    Hip extension    Hip abduction    Hip adduction    Hip internal rotation    Hip external rotation    Knee flexion    Knee extension    Ankle dorsiflexion    Ankle plantarflexion    Ankle inversion    Ankle eversion    Lumbar extension Deferred due to time and because signs and symptoms are consistent with lumbar weakness    (Blank rows = not tested)  GAIT: Distance walked: In the clinic Assistive device utilized: None Level of assistance: Complete Independence Comments: Clea notes  she is limited to about 10 minutes of standing and less than 10 minutes of walking due to increasing low back pain  TODAY'S TREATMENT:                                                                                                                              DATE: 10/10/2022  Bridging 10X 5 seconds Trunk/Lumbar extension (hips forward) 10X 3 seconds Shoulder blade pinches 10X 5 seconds Heel to toe raises with transversus abdominis activation 10X 3 seconds  Functional Activities: Log roll for bed mobility, spine education with model  PATIENT EDUCATION:  Education details: See above Person educated: Patient Education method: Programmer, multimedia, Demonstration, Tactile cues, Verbal cues, and Handouts Education comprehension: verbalized understanding, returned demonstration, verbal cues required, tactile cues required, and needs further education  HOME EXERCISE PROGRAM: [id number]  ASSESSMENT:  CLINICAL IMPRESSION: Patient is a 57 y.o. female who was seen today for physical therapy evaluation and treatment for low back pain.  Solymar has a 1 and 1/2 year history of increasing low back pain.  Increasing low back pain limits standing to about 10 minutes and walking to less than 10 minutes.  She does note some positive changes with weight loss over the past several days  since starting Ozempic.  She would like to be able to stand and walk for longer periods of time to be more functional and have less back pain.  Her prognosis to meet the below listed goals is good with the recommended course of rehabilitation.  OBJECTIVE IMPAIRMENTS: Abnormal gait, decreased activity tolerance, decreased endurance, decreased knowledge of condition, difficulty walking, decreased ROM, decreased strength, decreased safety awareness, impaired perceived functional ability, improper body mechanics, postural dysfunction, obesity, and pain.   ACTIVITY LIMITATIONS: standing, stairs, and locomotion level  PARTICIPATION LIMITATIONS: cleaning, laundry, shopping, and community activity  PERSONAL FACTORS: OA, chronic kidney disease, HLD, HTN, obesity, restless leg syndrome, previous lumbar surgery (laminectomy?) are also affecting patient's functional outcome.   REHAB POTENTIAL: Good  CLINICAL DECISION MAKING: Stable/uncomplicated  EVALUATION COMPLEXITY: Low   GOALS: Goals reviewed with patient? Yes  SHORT TERM GOALS: Target date: 11/07/2022  Elyna will be independent with her day 1 home exercise program Baseline: Started 10/10/2022 Goal status: INITIAL  2.  Improve standing lumbar extension AROM to 5 degrees Baseline: Less than 5 degrees Goal status: INITIAL  3.  Davelyn will be able to stand for 12 to 15 minutes and walk for at least 10 minutes before being stopped by low back pain Baseline: 10 minutes and less than 10 minutes, respectively Goal status: INITIAL   LONG TERM GOALS: Target date: 12/05/2022  Improve FOTO to 56 in 12 visits Baseline: 47 Goal status: INITIAL  2.  Vessie will report low back pain consistently 0-3 out of 10 on the numeric pain rating scale with no increases in peripheral symptoms Baseline: 5 out of 10 and peripheral symptoms can be 7 out of 10 Goal status: INITIAL  3.  Improve bilateral lower extremity flexibility for hip flexion to 90, hamstrings  to 45 and hip external rotation to 40 degrees Baseline: 80/75; 35/35 and 26/27 respectively Goal status: INITIAL  4.  Kirsta will be able to stand and walk for 20+ minutes without being stopped by low back pain Baseline: 10 minutes and less than 10 minutes Goal status: INITIAL  5.  Taneika will be independent with her long-term home exercise program at discharge Baseline: Started 10/10/2022 Goal status: INITIAL  PLAN:  PT FREQUENCY: 1-2x/week  PT DURATION: 8 weeks  PLANNED INTERVENTIONS: Therapeutic exercises, Therapeutic activity, Patient/Family education, Self Care, Stair training, Dry Needling, Cryotherapy, Traction, and Manual therapy.  PLAN FOR NEXT SESSION: Review home exercises, address posture and body mechanics as needed, progress scapular and low back strength.  Check on weight loss progress and continue to strengthen legs to allow for improved body mechanics and to reduce strain on the low back.   Cherlyn Cushing, PT, MPT 10/10/2022, 2:42 PM     PHYSICAL THERAPY DISCHARGE SUMMARY  Visits from Start of Care: 1  Current functional level related to goals / functional outcomes: See note   Remaining deficits: See note   Education / Equipment: HEP  Patient goals were not met. Patient is being discharged due to not returning since the last visit.  Chyrel Masson, PT, DPT, OCS, ATC 12/09/22  3:50 PM

## 2022-10-10 ENCOUNTER — Ambulatory Visit (INDEPENDENT_AMBULATORY_CARE_PROVIDER_SITE_OTHER): Payer: BC Managed Care – PPO | Admitting: Rehabilitative and Restorative Service Providers"

## 2022-10-10 ENCOUNTER — Encounter: Payer: Self-pay | Admitting: Rehabilitative and Restorative Service Providers"

## 2022-10-10 DIAGNOSIS — R293 Abnormal posture: Secondary | ICD-10-CM

## 2022-10-10 DIAGNOSIS — M5459 Other low back pain: Secondary | ICD-10-CM | POA: Diagnosis not present

## 2022-10-10 DIAGNOSIS — M6281 Muscle weakness (generalized): Secondary | ICD-10-CM | POA: Diagnosis not present

## 2022-10-10 DIAGNOSIS — R262 Difficulty in walking, not elsewhere classified: Secondary | ICD-10-CM | POA: Diagnosis not present

## 2022-10-15 ENCOUNTER — Telehealth: Payer: Self-pay | Admitting: *Deleted

## 2022-10-15 NOTE — Telephone Encounter (Signed)
BMI on 4/24 53.21. Call to pt to set up OV with NP. Pt stated she understood why OV and hospital setting was safer for her. OV with Kaitlyn Whitney made for Tuesday 5/7 @ 11:30 am. Pt informed it is a in person appointment and to arrive 15 min before 11:00 am .

## 2022-10-16 ENCOUNTER — Encounter: Payer: BC Managed Care – PPO | Admitting: Rehabilitative and Restorative Service Providers"

## 2022-10-21 ENCOUNTER — Ambulatory Visit: Payer: BC Managed Care – PPO | Admitting: Nurse Practitioner

## 2022-10-21 ENCOUNTER — Encounter: Payer: Self-pay | Admitting: Nurse Practitioner

## 2022-10-21 DIAGNOSIS — Z1211 Encounter for screening for malignant neoplasm of colon: Secondary | ICD-10-CM | POA: Diagnosis not present

## 2022-10-21 MED ORDER — NA SULFATE-K SULFATE-MG SULF 17.5-3.13-1.6 GM/177ML PO SOLN: 1.0000 | ORAL | 0 refills | Status: DC

## 2022-10-21 NOTE — Patient Instructions (Signed)
_______________________________________________________  If your blood pressure at your visit was 140/90 or greater, please contact your primary care physician to follow up on this.  If you are age 56 or younger, your body mass index should be between 19-25. Your Body mass index is 51.69 kg/m. If this is out of the aformentioned range listed, please consider follow up with your Primary Care Provider.  ________________________________________________________  The [place] [facility] providers would like to encourage you to use [facility] to communicate with providers for non-urgent requests or questions.  Due to long hold times on the telephone, sending your provider a message by [facility] may be a faster and more efficient way to get a response.  Please allow 48 business hours for a response.  Please remember that this is for non-urgent requests.  _______________________________________________________  Kaitlyn Whitney have been scheduled for a colonoscopy. Please follow written instructions given to you at your visit today.  Please pick up your prep supplies at the pharmacy within the next 1-3 days. If you use inhalers (even only as needed), please bring them with you on the day of your procedure.  Due to recent changes in healthcare laws, you may see the results of your imaging and laboratory studies on MyChart before your provider has had a chance to review them.  We understand that in some cases there may be results that are confusing or concerning to you. Not all laboratory results come back in the same time frame and the provider may be waiting for multiple results in order to interpret others.  Please give [place] 48 hours in order for your provider to thoroughly review all the results before contacting the office for clarification of your results.   Thank you for entrusting me with your care and choosing [facility].  Willette Cluster, NP

## 2022-10-21 NOTE — Progress Notes (Signed)
Assessment and Plan   Primary Gi:. Kaitlyn Jupiter, MD  Brief Narrative:  57 y.o. yo female with a past medical history consisting of, but not limited to obesity, laparoscopic banding, HTN, DM2  Colon cancer screening.   No FMH of colon cancer.  No alarm symptoms.  Hemoglobin normal at 14.1 -Schedule for a screening colonoscopy. The risks and benefits of colonoscopy with possible polypectomy / biopsies were discussed and the patient agrees to proceed. Due to BMI the procedure will need to be done at the hospital.   Obesity. Remote lap band in place, currently deflated . BMI 51.6  DM2.   Hgb A1c 6.2. -Hold Ozempic 7 days prior to colonoscopy.   HTN.  BP 128/84 today   History of Present Illness   Chief complaint: Needs to schedule screening colonoscopy  Kaitlyn Whitney had a colonoscopy in 2009.  We sent her a letter in 2019 reminding her that it was time to get her 10-year screening colonoscopy.  Patient was last seen in Jan 2022 for evaluation of GERD / dysphagia which turned out to be related to her lap band.The Lap Band is still in place but deflated.    Interval History:  Our office was trying to schedule patient for a direct screening colonoscopy. Due to her BMI the procedure needs to be done at the hospital . She was asked to come in for an office appointment to discuss.  Kaitlyn Whitney has no upper or lower GI problems. No bowel changes. No blood in stool. No FMH of colon cancer.    Previous GI Endoscopies / Labs / Imaging  EGD Sept 20221 - Dilation in the entire esophagus of unclear significance. - LA Grade B reflux esophagitis with no bleeding. - Extrinsic compression in the cardia from LapBand. - Normal examined duodenum. - No specimens collected.     Latest Ref Rng & Units 09/15/2022   11:57 AM 05/18/2020    8:48 AM 10/25/2019    9:49 AM  Hepatic Function  Total Protein 6.1 - 8.1 g/dL 7.8  6.4  6.8   Albumin 3.8 - 4.9 g/dL  3.7  3.8   AST 10 - 35 U/L 16  21  16    ALT 6 - 29 U/L  17  25  14    Alk Phosphatase 44 - 121 IU/L  106  88   Total Bilirubin 0.2 - 1.2 mg/dL 0.4  0.2  0.3   Bilirubin, Direct 0.00 - 0.40 mg/dL  <7.82         Latest Ref Rng & Units 09/15/2022   11:57 AM 05/06/2020    1:19 PM 05/03/2020    4:41 AM  CBC  WBC 3.8 - 10.8 Thousand/uL 7.0  7.8  9.6   Hemoglobin 11.7 - 15.5 g/dL 95.6  21.3  08.6   Hematocrit 35.0 - 45.0 % 43.8  44.8  41.7   Platelets 140 - 400 Thousand/uL 317  292  305      Past Medical History:  Diagnosis Date   Anxiety    Arthritis    hands   Chronic kidney disease    kidney stones   GERD (gastroesophageal reflux disease)    History of kidney stones 2016   Hyperlipidemia    Hypertension    Obesity    Restless leg syndrome    Sleep apnea    c-pap    Past Surgical History:  Procedure Laterality Date   bone spur removal     left side of lumbar  spine   BREAST SURGERY  2000   breast reduccion   COLONOSCOPY     HYSTERECTOMY ABDOMINAL WITH SALPINGECTOMY Bilateral 08/18/2017   Procedure: SUPRACERVICAL HYSTERECTOMY ABDOMINAL;  Surgeon: Hermina Staggers, MD;  Location: [facility];  Service: Gynecology;  Laterality: Bilateral;   LAPAROSCOPIC GASTRIC BANDING  ~ 2010   LYSIS OF ADHESION N/A 08/18/2017   Procedure: LYSIS OF ADHESION;  Surgeon: Hermina Staggers, MD;  Location: [facility];  Service: Gynecology;  Laterality: N/A;   SALPINGOOPHORECTOMY Bilateral 08/18/2017   Procedure: SALPINGO OOPHORECTOMY;  Surgeon: Hermina Staggers, MD;  Location: [facility];  Service: Gynecology;  Laterality: Bilateral;   TUBAL LIGATION  2005    Current Medications, Allergies, Family History and Social History were reviewed in [employer] record.     Current Outpatient Medications  Medication Sig Dispense Refill   amLODipine (NORVASC) 10 MG tablet Take 1 tablet (10 mg total) by mouth daily. 90 tablet 1   atorvastatin (LIPITOR) 40 MG tablet Take 1 tablet (40 mg total) by mouth daily. 90 tablet 1   losartan (COZAAR) 50 MG tablet  TAKE 1 TABLET BY MOUTH EVERY DAY 90 tablet 1   metoprolol tartrate (LOPRESSOR) 100 MG tablet Take 1 tablet (100 mg total) by mouth 2 (two) times daily. Take 1 tablet by mouth 2 hours prior to CT scan 180 tablet 1   Misc Natural Products (ELDERBERRY IMMUNE COMPLEX) CHEW Chew 1 tablet by mouth.     potassium chloride SA (KLOR-CON M) 20 MEQ tablet Take 1 tablet (20 mEq total) by mouth 2 (two) times daily. 10 tablet 0   Semaglutide,0.25 or 0.5MG /DOS, 2 MG/3ML SOPN Inject 0.5 mg into the skin once a week. 3 mL 0   sertraline (ZOLOFT) 50 MG tablet Take 1 tablet (50 mg total) by mouth daily. 90 tablet 1   traZODone (DESYREL) 50 MG tablet TAKE 0.5-1 TABLETS BY MOUTH AT BEDTIME AS NEEDED FOR SLEEP. 90 tablet 1   No current facility-administered medications for this visit.    Review of Systems: No chest pain. No shortness of breath. No urinary complaints.    Physical Exam  Wt Readings from Last 3 Encounters:  10/21/22 282 lb 9.6 oz (128.2 kg)  09/29/22 291 lb (132 kg)  09/22/22 291 lb (132 kg)    BP 128/84 (BP Location: Right Arm, Patient Position: Sitting, Cuff Size: Large)   Pulse 70   Ht 5\' 2"  (1.575 m)   Wt 282 lb 9.6 oz (128.2 kg)   LMP 12/15/2016 (Approximate)   BMI 51.69 kg/m  Constitutional:  Pleasant, obese female in no acute distress. Psychiatric: Normal mood and affect. Behavior is normal. EENT: Pupils normal.  Conjunctivae are normal. No scleral icterus. Neck supple.  Cardiovascular: Normal rate, regular rhythm.  Pulmonary/chest: Effort normal and breath sounds normal. No wheezing, rales or rhonchi. Abdominal: Soft, nondistended, nontender. Bowel sounds active throughout. There are no masses palpable. No hepatomegaly. Neurological: Alert and oriented to person place and time.  Skin: Skin is warm and dry. No rashes noted.  Willette Cluster, NP  10/21/2022, 11:48 AM

## 2022-10-23 ENCOUNTER — Encounter: Payer: BC Managed Care – PPO | Admitting: Rehabilitative and Restorative Service Providers"

## 2022-10-23 NOTE — Progress Notes (Signed)
____________________________________________________________  Attending physician addendum:  Thank you for sending this case to me. I have reviewed the entire note and agree with the plan.  This has to go on my waiting list for hospital outpatient procedures, which currently is 4-6 months out for routine procedures.  Amada Jupiter, MD  ____________________________________________________________  Sharol Harness,  Please add this patient to my waiting list.  - HD

## 2022-10-24 NOTE — Progress Notes (Signed)
Patient has been added to Dr. Danis' hospital wait list 

## 2022-10-29 ENCOUNTER — Ambulatory Visit: Payer: BC Managed Care – PPO | Admitting: Family

## 2022-10-29 ENCOUNTER — Telehealth: Payer: Self-pay

## 2022-10-29 ENCOUNTER — Encounter: Payer: Self-pay | Admitting: Family

## 2022-10-29 VITALS — BP 126/70 | HR 67 | Temp 97.1°F | Resp 18 | Ht 62.0 in | Wt 278.0 lb

## 2022-10-29 DIAGNOSIS — R7303 Prediabetes: Secondary | ICD-10-CM | POA: Diagnosis not present

## 2022-10-29 DIAGNOSIS — Z6841 Body Mass Index (BMI) 40.0 and over, adult: Secondary | ICD-10-CM | POA: Diagnosis not present

## 2022-10-29 DIAGNOSIS — I10 Essential (primary) hypertension: Secondary | ICD-10-CM

## 2022-10-29 DIAGNOSIS — E782 Mixed hyperlipidemia: Secondary | ICD-10-CM

## 2022-10-29 MED ORDER — SEMAGLUTIDE(0.25 OR 0.5MG/DOS) 2 MG/3ML ~~LOC~~ SOPN
0.5000 mg | PEN_INJECTOR | SUBCUTANEOUS | 0 refills | Status: DC
Start: 2022-10-29 — End: 2023-04-07

## 2022-10-29 MED ORDER — VITAMIN D3 25 MCG (1000 UT) PO CAPS: 1000.0000 [IU] | ORAL_CAPSULE | Freq: Every day | ORAL | 3 refills | Status: DC

## 2022-10-29 NOTE — Progress Notes (Signed)
Provider: Richarda Blade FNP-C  Karen Huhta, Donalee Citrin, NP  Patient Care Team: Mignonne Afonso, Donalee Citrin, NP as PCP - General (Family Medicine) Dohmeier, Porfirio Mylar, MD as Consulting Physician (Neurology)  Extended Emergency Contact Information Primary Emergency Contact: [facility] Address: [address]          [place], [place] [address] [place] Home Phone: [phone] Mobile Phone: [phone] Relation: Spouse Secondary Emergency Contact: Dema Severin Address: [place] Home Phone: [phone] Relation: Sister  Code Status:Full Code  Goals of care: Advanced Directive information    10/10/2022   11:18 AM  Advanced Directives  Does Patient Have a Medical Advance Directive? No  Would patient like information on creating a medical advance directive? No - Patient declined     Chief Complaint  Patient presents with   Medical Management of Chronic Issues    Patient is here for a 75M F/U for weight managment   Quality Metric Gaps    Discuss need for colonoscopy and updated covid vacccine    HPI:  Pt is a 57 y.o. female seen today for an acute visit for evaluation of weight. She was started on Ozempic due to hyperglycemia.states no side effects.  Has cut down sweets. Exercise by doing virtual exercise daily for 30 minutes.Has lost 5 lbs since last seen 2 weeks ago.  Drinks 4 bottles of water.  Last Hgb A1C 6.2    Past Medical History:  Diagnosis Date   Anxiety    Arthritis    hands   Chronic kidney disease    kidney stones   GERD (gastroesophageal reflux disease)    History of kidney stones 2016   Hyperlipidemia    Hypertension    Obesity    Restless leg syndrome    Sleep apnea    c-pap   Past Surgical History:  Procedure Laterality Date   bone spur removal     left side of lumbar spine   BREAST SURGERY  2000   breast reduccion   COLONOSCOPY     HYSTERECTOMY ABDOMINAL WITH SALPINGECTOMY Bilateral 08/18/2017    Procedure: SUPRACERVICAL HYSTERECTOMY ABDOMINAL;  Surgeon: Hermina Staggers, MD;  Location: [facility];  Service: Gynecology;  Laterality: Bilateral;   LAPAROSCOPIC GASTRIC BANDING  ~ 2010   LYSIS OF ADHESION N/A 08/18/2017   Procedure: LYSIS OF ADHESION;  Surgeon: Hermina Staggers, MD;  Location: [facility];  Service: Gynecology;  Laterality: N/A;   SALPINGOOPHORECTOMY Bilateral 08/18/2017   Procedure: SALPINGO OOPHORECTOMY;  Surgeon: Hermina Staggers, MD;  Location: [facility];  Service: Gynecology;  Laterality: Bilateral;   TUBAL LIGATION  2005    Allergies  Allergen Reactions   Penicillins Other (See Comments)    Yeast infection Has patient had a PCN reaction causing immediate rash, facial/tongue/throat swelling, SOB or lightheadedness with hypotension: No Has patient had a PCN reaction causing severe rash involving mucus membranes or skin necrosis: No Has patient had a PCN reaction that required hospitalization: Unknown Has patient had a PCN reaction occurring within the last 10 years: No If all of the above answers are "NO", then may proceed with Cephalosporin use.    Outpatient Encounter Medications as of 10/29/2022  Medication Sig   amLODipine (NORVASC) 10 MG tablet Take 1 tablet (10 mg total) by mouth daily.   atorvastatin (LIPITOR) 40 MG tablet Take 1 tablet (40 mg total) by mouth daily.   losartan (COZAAR) 50 MG tablet TAKE 1 TABLET BY MOUTH EVERY DAY  metoprolol tartrate (LOPRESSOR) 100 MG tablet Take 1 tablet (100 mg total) by mouth 2 (two) times daily. Take 1 tablet by mouth 2 hours prior to CT scan   Misc Natural Products (ELDERBERRY IMMUNE COMPLEX) CHEW Chew 1 tablet by mouth.   Na Sulfate-K Sulfate-Mg Sulf (SUPREP BOWEL PREP KIT) 17.5-3.13-1.6 GM/177ML SOLN Take 1 kit by mouth as directed.   potassium chloride SA (KLOR-CON M) 20 MEQ tablet Take 1 tablet (20 mEq total) by mouth 2 (two) times daily.   Semaglutide,0.25 or 0.5MG /DOS, 2 MG/3ML SOPN Inject 0.5 mg into the skin once a week.    sertraline (ZOLOFT) 50 MG tablet Take 1 tablet (50 mg total) by mouth daily.   traZODone (DESYREL) 50 MG tablet TAKE 0.5-1 TABLETS BY MOUTH AT BEDTIME AS NEEDED FOR SLEEP.   No facility-administered encounter medications on file as of 10/29/2022.    Review of Systems  Constitutional:  Negative for appetite change, chills, fatigue, fever and unexpected weight change.  Eyes:  Negative for pain, discharge, redness, itching and visual disturbance.  Respiratory:  Negative for cough, chest tightness, shortness of breath and wheezing.   Cardiovascular:  Negative for chest pain, palpitations and leg swelling.  Gastrointestinal:  Negative for abdominal distention, abdominal pain, blood in stool, constipation, diarrhea, nausea and vomiting.  Endocrine: Negative for cold intolerance, heat intolerance, polydipsia, polyphagia and polyuria.  Genitourinary:  Negative for difficulty urinating, dysuria, flank pain, frequency and urgency.  Musculoskeletal:  Negative for arthralgias, back pain, gait problem, joint swelling, myalgias, neck pain and neck stiffness.  Skin:  Negative for color change, pallor and rash.  Neurological:  Negative for dizziness, syncope, speech difficulty, weakness, light-headedness, numbness and headaches.    Immunization History  Administered Date(s) Administered   Influenza Whole 07/01/2007   Influenza,inj,Quad PF,6+ Mos 04/22/2012   Tdap 02/22/2020   Pertinent  Health Maintenance Due  Topic Date Due   COLONOSCOPY (Pts 45-25yrs Insurance coverage will need to be confirmed)  11/17/2017   MAMMOGRAM  12/28/2022   INFLUENZA VACCINE  01/15/2023   PAP SMEAR-Modifier  12/20/2023      05/03/2020    4:30 AM 05/06/2020    1:00 PM 10/16/2021   12:50 AM 10/16/2021   12:55 PM 09/15/2022   11:02 AM  Fall Risk  Falls in the past year?     0  Was there an injury with Fall?     0  Fall Risk Category Calculator     0  (RETIRED) Patient Fall Risk Level Low fall risk Low fall risk Low fall  risk Low fall risk   Patient at Risk for Falls Due to     No Fall Risks   Functional Status Survey:    Vitals:   10/29/22 0917  BP: 126/70  Pulse: 67  Resp: 18  Temp: (!) 97.1 F (36.2 C)  SpO2: 96%  Weight: 278 lb (126.1 kg)  Height: 5\' 2"  (1.575 m)   Body mass index is 50.85 kg/m. Physical Exam Vitals reviewed.  Constitutional:      General: She is not in acute distress.    Appearance: Normal appearance. She is morbidly obese. She is not ill-appearing or diaphoretic.  HENT:     Head: Normocephalic.     Right Ear: Tympanic membrane, ear canal and external ear normal. There is no impacted cerumen.     Left Ear: Tympanic membrane, ear canal and external ear normal. There is no impacted cerumen.     Nose: Nose normal. No congestion or  rhinorrhea.     Mouth/Throat:     Mouth: Mucous membranes are moist.     Pharynx: Oropharynx is clear. No oropharyngeal exudate or posterior oropharyngeal erythema.  Eyes:     General: No scleral icterus.       Right eye: No discharge.        Left eye: No discharge.     Extraocular Movements: Extraocular movements intact.     Conjunctiva/sclera: Conjunctivae normal.     Pupils: Pupils are equal, round, and reactive to light.  Neck:     Vascular: No carotid bruit.  Cardiovascular:     Rate and Rhythm: Normal rate and regular rhythm.     Pulses: Normal pulses.     Heart sounds: Normal heart sounds. No murmur heard.    No friction rub. No gallop.  Pulmonary:     Effort: Pulmonary effort is normal. No respiratory distress.     Breath sounds: Normal breath sounds. No wheezing, rhonchi or rales.  Chest:     Chest wall: No tenderness.  Abdominal:     General: Bowel sounds are normal. There is no distension.     Palpations: Abdomen is soft. There is no mass.     Tenderness: There is no abdominal tenderness. There is no right CVA tenderness, left CVA tenderness, guarding or rebound.  Musculoskeletal:        General: No swelling or  tenderness. Normal range of motion.     Cervical back: Normal range of motion. No rigidity or tenderness.     Right lower leg: No edema.     Left lower leg: No edema.  Lymphadenopathy:     Cervical: No cervical adenopathy.  Skin:    General: Skin is warm and dry.     Coloration: Skin is not pale.     Findings: No bruising, erythema, lesion or rash.  Neurological:     Mental Status: She is alert and oriented to person, place, and time.     Cranial Nerves: No cranial nerve deficit.     Sensory: No sensory deficit.     Motor: No weakness.     Coordination: Coordination normal.     Gait: Gait normal.  Psychiatric:        Mood and Affect: Mood normal.        Speech: Speech normal.        Behavior: Behavior normal.        Thought Content: Thought content normal.        Judgment: Judgment normal.     Labs reviewed: Recent Labs    09/15/22 1157  NA 142  K 4.2  CL 107  CO2 29  GLUCOSE 105*  BUN 11  CREATININE 0.78  CALCIUM 9.7   Recent Labs    09/15/22 1157  AST 16  ALT 17  BILITOT 0.4  PROT 7.8   Recent Labs    09/15/22 1157  WBC 7.0  NEUTROABS 2,842  HGB 14.1  HCT 43.8  MCV 91.1  PLT 317   Lab Results  Component Value Date   TSH 1.94 09/15/2022   Lab Results  Component Value Date   HGBA1C 6.2 (H) 09/29/2022   Lab Results  Component Value Date   CHOL 186 09/15/2022   HDL 45 (L) 09/15/2022   LDLCALC 121 (H) 09/15/2022   TRIG 101 09/15/2022   CHOLHDL 4.1 09/15/2022    Significant Diagnostic Results in last 30 days:  No results found.  Assessment/Plan 1. Morbid obesity (HCC)  BMI 50.85 with associated prediabetes and Obstructive sleep Apnea.  - Has had 5 lbs over 2 weeks on semaglutide 0.25 mg will increase to 0.5 mg injection.sample given and prescription sent to pharmacy.  - Semaglutide,0.25 or 0.5MG /DOS, 2 MG/3ML SOPN; Inject 0.5 mg into the skin once a week.  Dispense: 3 mL; Refill: 0  2. Prediabetes Lab Results  Component Value Date    HGBA1C 6.2 (H) 09/29/2022  High risk for type 2 DM - dietary modification and exercise at least three times daily per week.  - Semaglutide,0.25 or 0.5MG /DOS, 2 MG/3ML SOPN; Inject 0.5 mg into the skin once a week.  Dispense: 3 mL; Refill: 0  3. Adult BMI 50.0-59.9 kg/sq m (HCC) BMI 50.85  Semaglutide as above.  - Semaglutide,0.25 or 0.5MG /DOS, 2 MG/3ML SOPN; Inject 0.5 mg into the skin once a week.  Dispense: 3 mL; Refill: 0  Family/ staff Communication: Reviewed plan of care with patient verbalized understanding   Labs/tests ordered: Future labs:  - CBC with Differential/Platelet - CMP with eGFR([facility]) - TSH - Hgb A1C - Lipid panel  Next Appointment: Return in about 4 months (around 03/01/2023) for medical mangement of chronic issues., fasting labs prior to visit.   Caesar Bookman, NP

## 2022-10-29 NOTE — Telephone Encounter (Signed)
PA request received fro ozempic 0.25 or 0.5mg  dose. PA was previously denied. New PA initiated through covermymeds.

## 2022-10-30 ENCOUNTER — Encounter: Payer: BC Managed Care – PPO | Admitting: Rehabilitative and Restorative Service Providers"

## 2022-10-30 ENCOUNTER — Telehealth: Payer: Self-pay | Admitting: Family

## 2022-10-30 NOTE — Telephone Encounter (Signed)
Caller name: [place]- [facility]  On DPR?: Yes  Call back number: [phone]  Provider they see: Ngetich, Donalee Citrin, NP  Reason for call: Calling Kaitlyn Whitney leaving message  [place] contacted [name] about here referral. +

## 2022-10-30 NOTE — Telephone Encounter (Signed)
Pt wanted you to know that [place] contacted her about her referral

## 2022-11-03 ENCOUNTER — Ambulatory Visit (HOSPITAL_COMMUNITY): Payer: Self-pay | Admitting: Psychiatry

## 2022-11-06 ENCOUNTER — Encounter: Payer: BC Managed Care – PPO | Admitting: Gastroenterology

## 2022-11-06 ENCOUNTER — Encounter: Payer: BC Managed Care – PPO | Admitting: Rehabilitative and Restorative Service Providers"

## 2022-11-12 ENCOUNTER — Encounter: Payer: BC Managed Care – PPO | Admitting: Rehabilitative and Restorative Service Providers"

## 2022-11-19 ENCOUNTER — Ambulatory Visit: Payer: BC Managed Care – PPO | Attending: Physical Therapy | Admitting: Physical Therapy

## 2022-11-19 ENCOUNTER — Telehealth: Payer: Self-pay | Admitting: Physical Therapy

## 2022-11-19 NOTE — Telephone Encounter (Signed)
Called pt and left VM regarding missed visit.  Provided contact info for clinic if she would like to r/s.

## 2022-12-25 ENCOUNTER — Encounter (HOSPITAL_COMMUNITY): Payer: Self-pay | Admitting: Gastroenterology

## 2023-01-02 ENCOUNTER — Telehealth: Payer: Self-pay

## 2023-01-02 NOTE — Telephone Encounter (Signed)
Called and spoke with patient to confirm colonoscopy procedure appt at [facility] with Dr. Myrtie Neither on 01/06/23 at 8:30 am. Pt confirmed that she has her prep and instructions. Pt is aware that she will need to arrive at [facility] by 7 am with a care partner. Pt confirmed that she did hold her Semaglutide injection as directed. Pt verbalized understanding and had no concerns at the end of the call.

## 2023-01-05 NOTE — Anesthesia Preprocedure Evaluation (Signed)
Anesthesia Evaluation  Patient identified by MRN, date of birth, ID band Patient awake    Reviewed: Allergy & Precautions, NPO status , Patient's Chart, lab work & pertinent test results, Unable to perform ROS - Chart review only  Airway Mallampati: II  TM Distance: >3 FB Neck ROM: Full    Dental no notable dental hx.    Pulmonary sleep apnea and Continuous Positive Airway Pressure Ventilation , Patient abstained from smoking., former smoker   Pulmonary exam normal breath sounds clear to auscultation       Cardiovascular hypertension, Pt. on medications + angina  Normal cardiovascular exam Rhythm:Regular Rate:Normal     Neuro/Psych  PSYCHIATRIC DISORDERS Anxiety Depression    negative neurological ROS     GI/Hepatic negative GI ROS, Neg liver ROS,GERD  ,,  Endo/Other    Morbid obesity  Renal/GU Renal diseasenegative Renal ROS  negative genitourinary   Musculoskeletal negative musculoskeletal ROS (+) Arthritis ,    Abdominal  (+) + obese  Peds negative pediatric ROS (+)  Hematology negative hematology ROS (+)   Anesthesia Other Findings   Reproductive/Obstetrics negative OB ROS                             Anesthesia Physical Anesthesia Plan  ASA: 4  Anesthesia Plan: MAC   Post-op Pain Management:    Induction: Intravenous  PONV Risk Score and Plan: 3 and Ondansetron, Midazolam, Scopolamine patch - Pre-op and Propofol infusion  Airway Management Planned:   Additional Equipment:   Intra-op Plan:   Post-operative Plan:   Informed Consent:   Plan Discussed with:   Anesthesia Plan Comments:         Anesthesia Quick Evaluation

## 2023-01-06 ENCOUNTER — Ambulatory Visit (HOSPITAL_COMMUNITY): Payer: BC Managed Care – PPO | Admitting: Anesthesiology

## 2023-01-06 ENCOUNTER — Other Ambulatory Visit: Payer: Self-pay

## 2023-01-06 ENCOUNTER — Encounter (HOSPITAL_COMMUNITY)
Admission: RE | Disposition: A | Discharge status: Home / Self Care | Payer: Self-pay | Source: Home / Self Care | Attending: Gastroenterology

## 2023-01-06 ENCOUNTER — Ambulatory Visit (HOSPITAL_COMMUNITY)
Admission: RE | Admit: 2023-01-06 | Discharge: 2023-01-06 | Disposition: A | Discharge status: Home / Self Care | Payer: BC Managed Care – PPO | Attending: Gastroenterology | Admitting: Gastroenterology

## 2023-01-06 ENCOUNTER — Encounter (HOSPITAL_COMMUNITY): Payer: Self-pay | Admitting: Gastroenterology

## 2023-01-06 DIAGNOSIS — K648 Other hemorrhoids: Secondary | ICD-10-CM

## 2023-01-06 DIAGNOSIS — G4730 Sleep apnea, unspecified: Secondary | ICD-10-CM | POA: Insufficient documentation

## 2023-01-06 DIAGNOSIS — D124 Benign neoplasm of descending colon: Secondary | ICD-10-CM | POA: Diagnosis not present

## 2023-01-06 DIAGNOSIS — Z79899 Other long term (current) drug therapy: Secondary | ICD-10-CM | POA: Diagnosis not present

## 2023-01-06 DIAGNOSIS — I129 Hypertensive chronic kidney disease with stage 1 through stage 4 chronic kidney disease, or unspecified chronic kidney disease: Secondary | ICD-10-CM | POA: Insufficient documentation

## 2023-01-06 DIAGNOSIS — Z87891 Personal history of nicotine dependence: Secondary | ICD-10-CM | POA: Diagnosis not present

## 2023-01-06 DIAGNOSIS — Z6841 Body Mass Index (BMI) 40.0 and over, adult: Secondary | ICD-10-CM | POA: Diagnosis not present

## 2023-01-06 DIAGNOSIS — K5730 Diverticulosis of large intestine without perforation or abscess without bleeding: Secondary | ICD-10-CM

## 2023-01-06 DIAGNOSIS — N189 Chronic kidney disease, unspecified: Secondary | ICD-10-CM | POA: Insufficient documentation

## 2023-01-06 DIAGNOSIS — D122 Benign neoplasm of ascending colon: Secondary | ICD-10-CM

## 2023-01-06 DIAGNOSIS — Z1211 Encounter for screening for malignant neoplasm of colon: Secondary | ICD-10-CM | POA: Diagnosis not present

## 2023-01-06 DIAGNOSIS — D125 Benign neoplasm of sigmoid colon: Secondary | ICD-10-CM | POA: Insufficient documentation

## 2023-01-06 DIAGNOSIS — D120 Benign neoplasm of cecum: Secondary | ICD-10-CM

## 2023-01-06 DIAGNOSIS — Z09 Encounter for follow-up examination after completed treatment for conditions other than malignant neoplasm: Secondary | ICD-10-CM | POA: Diagnosis not present

## 2023-01-06 DIAGNOSIS — K6389 Other specified diseases of intestine: Secondary | ICD-10-CM | POA: Diagnosis not present

## 2023-01-06 DIAGNOSIS — Z1212 Encounter for screening for malignant neoplasm of rectum: Secondary | ICD-10-CM | POA: Diagnosis not present

## 2023-01-06 HISTORY — PX: COLONOSCOPY WITH PROPOFOL: SHX5780

## 2023-01-06 HISTORY — PX: POLYPECTOMY: SHX5525

## 2023-01-06 SURGERY — COLONOSCOPY WITH PROPOFOL
Anesthesia: Monitor Anesthesia Care

## 2023-01-06 MED ORDER — PROPOFOL 500 MG/50ML IV EMUL
INTRAVENOUS | Status: DC | PRN
  Administered 2023-01-06: 120 ug/kg/min via INTRAVENOUS

## 2023-01-06 MED ORDER — PROPOFOL 10 MG/ML IV BOLUS
INTRAVENOUS | Status: DC | PRN
  Administered 2023-01-06 (×3): 20 mg via INTRAVENOUS

## 2023-01-06 MED ORDER — PROPOFOL 1000 MG/100ML IV EMUL
INTRAVENOUS | Status: AC
  Filled 2023-01-06: qty 100

## 2023-01-06 MED ORDER — LACTATED RINGERS IV SOLN: INTRAVENOUS | Status: DC

## 2023-01-06 SURGICAL SUPPLY — 22 items

## 2023-01-06 NOTE — Transfer of Care (Signed)
Immediate Anesthesia Transfer of Care Note  Patient: Kaitlyn Whitney  Procedure(s) Performed: COLONOSCOPY WITH PROPOFOL POLYPECTOMY  Patient Location: PACU  Anesthesia Type:MAC  Level of Consciousness: sedated  Airway & Oxygen Therapy: Patient Spontanous Breathing and Patient connected to face mask oxygen  Post-op Assessment: Report given to RN and Post -op Vital signs reviewed and stable  Post vital signs: Reviewed and stable  Last Vitals:  Vitals Value Taken Time  BP    Temp    Pulse 89 01/06/23 0838  Resp 19 01/06/23 0838  SpO2 100 % 01/06/23 0838  Vitals shown include unfiled device data.  Last Pain:  Vitals:   01/06/23 0730  TempSrc: Temporal         Complications: No notable events documented.

## 2023-01-06 NOTE — Discharge Instructions (Signed)

## 2023-01-06 NOTE — Op Note (Signed)
[facility] Patient Name: Kaitlyn Whitney Procedure Date: 01/06/2023 MRN: [id number] Attending MD: Starr Lake. Myrtie Neither , MD, [id number] Date of Birth: 03-20-1966 CSN: [id number] Age: 57 Admit Type: Outpatient Procedure:                Colonoscopy Indications:              Screening for colorectal malignant neoplasm, This                            is the patient's first screening colonoscopy Providers:                Sherilyn Cooter L. Myrtie Neither, MD, Lorenza Evangelist, RN, Cephus Richer, RN, Martha Clan, RN, Kandice Robinsons, Technician Referring MD:              Medicines:                Monitored Anesthesia Care Complications:            No immediate complications. Estimated Blood Loss:     Estimated blood loss was minimal. Procedure:                Pre-Anesthesia Assessment:                           - Prior to the procedure, a History and Physical                            was performed, and patient medications and                            allergies were reviewed. The patient's tolerance of                            previous anesthesia was also reviewed. The risks                            and benefits of the procedure and the sedation                            options and risks were discussed with the patient.                            All questions were answered, and informed consent                            was obtained. Prior Anticoagulants: The patient has                            taken no anticoagulant or antiplatelet agents. ASA  Grade Assessment: III - A patient with severe                            systemic disease. After reviewing the risks and                            benefits, the patient was deemed in satisfactory                            condition to undergo the procedure.                           After obtaining informed consent, the colonoscope                             was passed under direct vision. Throughout the                            procedure, the patient's blood pressure, pulse, and                            oxygen saturations were monitored continuously. The                            CF-HQ190L ([id number]) Olympus colonoscope was                            introduced through the anus and advanced to the the                            cecum, identified by appendiceal orifice and                            ileocecal valve. The colonoscopy was somewhat                            difficult due to abdominal breathing, colon spasm                            and some difficulty retaining insufflation. The                            patient tolerated the procedure fairly well. The                            quality of the bowel preparation was good with                            lavage. The ileocecal valve, appendiceal orifice,                            and rectum were photographed. Scope In: 8:13:36 AM Scope Out: 8:33:22 AM Scope Withdrawal Time: 0 hours 18 minutes 14 seconds  Total Procedure Duration: 0 hours 19  minutes 46 seconds  Findings:      The digital rectal exam findings include decreased sphincter tone.      Repeat examination of right colon under NBI performed.      Multiple diverticula were found in the left colon and right colon.      A diminutive polyp was found in the cecum. The polyp was semi-sessile.       The polyp was removed with a cold biopsy forceps. Resection and       retrieval were complete.      Two sessile polyps were found in the sigmoid colon and ascending colon.       The polyps were diminutive in size. These polyps were removed with a       cold snare. Resection and retrieval were complete.      Internal hemorrhoids were found.      The exam was otherwise without abnormality on direct and retroflexion       views. Impression:               - Decreased sphincter tone found on digital rectal                             exam.                           - Diverticulosis in the left colon and in the right                            colon.                           - One diminutive polyp in the cecum, removed with a                            cold biopsy forceps. Resected and retrieved.                           - Two diminutive polyps in the sigmoid colon and in                            the ascending colon, removed with a cold snare.                            Resected and retrieved.                           - Internal hemorrhoids.                           - The examination was otherwise normal on direct                            and retroflexion views. Moderate Sedation:      MAC sedation used Recommendation:           - Patient has a contact number available for  emergencies. The signs and symptoms of potential                            delayed complications were discussed with the                            patient. Return to normal activities tomorrow.                            Written discharge instructions were provided to the                            patient.                           - Resume previous diet.                           - Continue present medications.                           - Await pathology results.                           - Repeat colonoscopy is recommended for                            surveillance. The colonoscopy date will be                            determined after pathology results from today's                            exam become available for review. Procedure Code(s):        --- Professional ---                           [phone], Colonoscopy, flexible; with removal of                            tumor(s), polyp(s), or other lesion(s) by snare                            technique                           45380, 59, Colonoscopy, flexible; with biopsy,                            single or multiple Diagnosis Code(s):        ---  Professional ---                           Z12.11, Encounter for screening for malignant                            neoplasm of colon  K62.89, Other specified diseases of anus and rectum                           K64.8, Other hemorrhoids                           D12.0, Benign neoplasm of cecum                           D12.5, Benign neoplasm of sigmoid colon                           D12.2, Benign neoplasm of ascending colon                           K57.30, Diverticulosis of large intestine without                            perforation or abscess without bleeding CPT copyright 2022 American Medical Association. All rights reserved. The codes documented in this report are preliminary and upon coder review may  be revised to meet current compliance requirements. Hanh Kertesz L. Myrtie Neither, MD 01/06/2023 9:03:28 AM This report has been signed electronically. Number of Addenda: 0

## 2023-01-06 NOTE — H&P (Signed)
History and Physical:  This patient presents for endoscopic testing for: Colon cancer screening  Average risk for CRC - first scfeening exam.  (Had a Dx colon 2009) Clinical details in May 2024 [facility] office consult note  Patient is otherwise without complaints or active issues today.   Past Medical History: Past Medical History:  Diagnosis Date   Anxiety    Arthritis    hands   Chronic kidney disease    kidney stones   GERD (gastroesophageal reflux disease)    History of kidney stones 2016   Hyperlipidemia    Hypertension    Obesity    Restless leg syndrome    Sleep apnea    c-pap     Past Surgical History: Past Surgical History:  Procedure Laterality Date   bone spur removal     left side of lumbar spine   BREAST SURGERY  2000   breast reduccion   COLONOSCOPY     HYSTERECTOMY ABDOMINAL WITH SALPINGECTOMY Bilateral 08/18/2017   Procedure: SUPRACERVICAL HYSTERECTOMY ABDOMINAL;  Surgeon: Hermina Staggers, MD;  Location: [facility];  Service: Gynecology;  Laterality: Bilateral;   LAPAROSCOPIC GASTRIC BANDING  ~ 2010   LYSIS OF ADHESION N/A 08/18/2017   Procedure: LYSIS OF ADHESION;  Surgeon: Hermina Staggers, MD;  Location: [facility];  Service: Gynecology;  Laterality: N/A;   SALPINGOOPHORECTOMY Bilateral 08/18/2017   Procedure: SALPINGO OOPHORECTOMY;  Surgeon: Hermina Staggers, MD;  Location: [facility];  Service: Gynecology;  Laterality: Bilateral;   TUBAL LIGATION  2005    Allergies: Allergies  Allergen Reactions   Penicillins Other (See Comments)    Yeast infection Has patient had a PCN reaction causing immediate rash, facial/tongue/throat swelling, SOB or lightheadedness with hypotension: No Has patient had a PCN reaction causing severe rash involving mucus membranes or skin necrosis: No Has patient had a PCN reaction that required hospitalization: Unknown Has patient had a PCN reaction occurring within the last 10 years: No If all of the above answers are "NO", then may  proceed with Cephalosporin use.    Outpatient Meds: Current Facility-Administered Medications  Medication Dose Route Frequency Provider Last Rate Last Admin   lactated ringers infusion   Intravenous Continuous Danis, Starr Lake III, MD          ___________________________________________________________________ Objective   Exam:  Wt 125.2 kg   LMP 12/15/2016 (Approximate)   BMI 50.48 kg/m   CV: regular , S1/S2 Resp: clear to auscultation bilaterally, normal RR and effort noted GI: soft, no tenderness, with active bowel sounds.Morbidly obese   Assessment: CRC screening  Plan: Colonoscopy   The benefits and risks of the planned procedure were described in detail with the patient or (when appropriate) their health care proxy.  Risks were outlined as including, but not limited to, bleeding, infection, perforation, adverse medication reaction leading to cardiac or pulmonary decompensation, pancreatitis (if ERCP).  The limitation of incomplete mucosal visualization was also discussed.  No guarantees or warranties were given.  The patient is appropriate for an endoscopic procedure in the ambulatory setting.   - Amada Jupiter, MD

## 2023-01-06 NOTE — Interval H&P Note (Signed)
History and Physical Interval Note:  01/06/2023 7:37 AM  Kaitlyn Whitney  has presented today for surgery, with the diagnosis of colon cancer screening, obesity.  The various methods of treatment have been discussed with the patient and family. After consideration of risks, benefits and other options for treatment, the patient has consented to  Procedure(s): COLONOSCOPY WITH PROPOFOL (N/A) as a surgical intervention.  The patient's history has been reviewed, patient examined, no change in status, stable for surgery.  I have reviewed the patient's chart and labs.  Questions were answered to the patient's satisfaction.     Charlie Pitter III

## 2023-01-07 NOTE — Anesthesia Postprocedure Evaluation (Signed)
Anesthesia Post Note  Patient: Kaitlyn Whitney  Procedure(s) Performed: COLONOSCOPY WITH PROPOFOL POLYPECTOMY     Patient location during evaluation: PACU Anesthesia Type: MAC Level of consciousness: awake and alert Pain management: pain level controlled Vital Signs Assessment: post-procedure vital signs reviewed and stable Respiratory status: spontaneous breathing Cardiovascular status: stable Anesthetic complications: no   No notable events documented.  Last Vitals:  Vitals:   01/06/23 0849 01/06/23 0859  BP: 127/81 138/75  Pulse: 79 68  Resp: (!) 25 18  Temp:    SpO2: 98% 95%    Last Pain:  Vitals:   01/06/23 0859  TempSrc:   PainSc: 0-No pain                 [name]

## 2023-01-08 ENCOUNTER — Encounter (HOSPITAL_COMMUNITY): Payer: Self-pay | Admitting: Gastroenterology

## 2023-01-08 ENCOUNTER — Encounter: Payer: Self-pay | Admitting: Gastroenterology

## 2023-01-08 LAB — SURGICAL PATHOLOGY

## 2023-03-02 ENCOUNTER — Ambulatory Visit: Payer: BC Managed Care – PPO | Admitting: Family

## 2023-03-09 ENCOUNTER — Encounter: Payer: Medicaid Other | Admitting: Family

## 2023-03-14 ENCOUNTER — Other Ambulatory Visit: Payer: Self-pay | Admitting: Family

## 2023-03-14 DIAGNOSIS — I10 Essential (primary) hypertension: Secondary | ICD-10-CM

## 2023-03-14 DIAGNOSIS — E782 Mixed hyperlipidemia: Secondary | ICD-10-CM

## 2023-03-15 NOTE — Progress Notes (Signed)
  This encounter was created in error - please disregard. No show 

## 2023-03-31 ENCOUNTER — Other Ambulatory Visit: Payer: Self-pay | Admitting: Family

## 2023-03-31 DIAGNOSIS — I10 Essential (primary) hypertension: Secondary | ICD-10-CM

## 2023-03-31 DIAGNOSIS — F419 Anxiety disorder, unspecified: Secondary | ICD-10-CM

## 2023-03-31 NOTE — Telephone Encounter (Signed)
High Risk Warning Populated when attempting to refill, I will send to Provider for further review

## 2023-03-31 NOTE — Telephone Encounter (Signed)
Sent patient a message to schedule for a follow-up appointment to see Ngetich, Donalee Citrin, NP    Message sent to Ngetich, Donalee Citrin, NP

## 2023-03-31 NOTE — Telephone Encounter (Signed)
Will send prescription for 30 days supply.Please make follow up visit prior to refilling next prescription.

## 2023-03-31 NOTE — Telephone Encounter (Signed)
Left message on voicemail for patient to return call when available

## 2023-04-02 DIAGNOSIS — K219 Gastro-esophageal reflux disease without esophagitis: Secondary | ICD-10-CM | POA: Diagnosis not present

## 2023-04-02 DIAGNOSIS — Z9884 Bariatric surgery status: Secondary | ICD-10-CM | POA: Diagnosis not present

## 2023-04-02 DIAGNOSIS — G4733 Obstructive sleep apnea (adult) (pediatric): Secondary | ICD-10-CM | POA: Diagnosis not present

## 2023-04-02 DIAGNOSIS — E785 Hyperlipidemia, unspecified: Secondary | ICD-10-CM | POA: Diagnosis not present

## 2023-04-07 ENCOUNTER — Ambulatory Visit (INDEPENDENT_AMBULATORY_CARE_PROVIDER_SITE_OTHER): Payer: BC Managed Care – PPO | Admitting: Family

## 2023-04-07 ENCOUNTER — Encounter: Payer: Self-pay | Admitting: Family

## 2023-04-07 VITALS — BP 132/80 | HR 80 | Temp 98.6°F | Resp 20 | Ht 62.0 in | Wt 283.0 lb

## 2023-04-07 DIAGNOSIS — I10 Essential (primary) hypertension: Secondary | ICD-10-CM

## 2023-04-07 DIAGNOSIS — Z2821 Immunization not carried out because of patient refusal: Secondary | ICD-10-CM

## 2023-04-07 DIAGNOSIS — E1165 Type 2 diabetes mellitus with hyperglycemia: Secondary | ICD-10-CM

## 2023-04-07 DIAGNOSIS — R7303 Prediabetes: Secondary | ICD-10-CM | POA: Diagnosis not present

## 2023-04-07 DIAGNOSIS — Z1231 Encounter for screening mammogram for malignant neoplasm of breast: Secondary | ICD-10-CM

## 2023-04-07 DIAGNOSIS — E782 Mixed hyperlipidemia: Secondary | ICD-10-CM | POA: Diagnosis not present

## 2023-04-07 MED ORDER — OZEMPIC (0.25 OR 0.5 MG/DOSE) 2 MG/3ML ~~LOC~~ SOPN: 0.5000 mg | PEN_INJECTOR | SUBCUTANEOUS | 3 refills | Status: DC

## 2023-04-07 NOTE — Progress Notes (Addendum)
Provider: Richarda Blade FNP-C   Brenna Friesenhahn, Donalee Citrin, NP  Patient Care Team: Batul Diego, Donalee Citrin, NP as PCP - General (Family Medicine) Dohmeier, Porfirio Mylar, MD as Consulting Physician (Neurology)  Extended Emergency Contact Information Primary Emergency Contact: [facility] Address: [address]          [place], [place] [address] [place] Home Phone: [phone] Mobile Phone: [phone] Relation: Spouse Secondary Emergency Contact: Dema Severin Address: [place] Home Phone: [phone] Relation: Sister  Code Status:  Full Code  Goals of care: Advanced Directive information    01/06/2023    7:18 AM  Advanced Directives  Does Patient Have a Medical Advance Directive? No     Chief Complaint  Patient presents with   Quality Metric Gaps    Mammogram, zoster   Medical Management of Chronic Issues    Patient presents today for 5 month follow-up    HPI:  Pt is a 57 y.o. female seen today for 5 months follow up for medical management of chronic diseases.  Has medical history of essential hypertension, hyperlipidemia, obstructive sleep apnea on CPAP, bilateral knee pain, Type 2 diabetes Mellitus, former cigarette smoker, neuropathy, GERD, generalized anxiety, insomnia, history of laparoscopic adjustable gastric band and placement within A.P.L. done on Oct 23, 2008 follows up with general surgery at [facility] last seen April 02, 2023.  Has follow-up plans for removal of her lap band and converting to gastric sleeve. She request refills on Ozempic  States recently bought treadmill and vibrating plate to use for exercise.  Has tried to change her diet but still eats pizza every now and then.   Declines influenza vaccine   Past Medical History:  Diagnosis Date   Anxiety    Arthritis    hands   Chronic kidney disease    kidney stones   GERD (gastroesophageal reflux disease)    History of kidney stones 2016    Hyperlipidemia    Hypertension    Obesity    Restless leg syndrome    Sleep apnea    c-pap   Past Surgical History:  Procedure Laterality Date   bone spur removal     left side of lumbar spine   BREAST SURGERY  2000   breast reduccion   COLONOSCOPY     COLONOSCOPY WITH PROPOFOL N/A 01/06/2023   Procedure: COLONOSCOPY WITH PROPOFOL;  Surgeon: Sherrilyn Rist, MD;  Location: [facility];  Service: Gastroenterology;  Laterality: N/A;   HYSTERECTOMY ABDOMINAL WITH SALPINGECTOMY Bilateral 08/18/2017   Procedure: SUPRACERVICAL HYSTERECTOMY ABDOMINAL;  Surgeon: Hermina Staggers, MD;  Location: [facility];  Service: Gynecology;  Laterality: Bilateral;   LAPAROSCOPIC GASTRIC BANDING  ~ 2010   LYSIS OF ADHESION N/A 08/18/2017   Procedure: LYSIS OF ADHESION;  Surgeon: Hermina Staggers, MD;  Location: [facility];  Service: Gynecology;  Laterality: N/A;   POLYPECTOMY  01/06/2023   Procedure: POLYPECTOMY;  Surgeon: Sherrilyn Rist, MD;  Location: [facility];  Service: Gastroenterology;;   SALPINGOOPHORECTOMY Bilateral 08/18/2017   Procedure: SALPINGO OOPHORECTOMY;  Surgeon: Hermina Staggers, MD;  Location: [facility];  Service: Gynecology;  Laterality: Bilateral;   TUBAL LIGATION  2005    Allergies  Allergen Reactions   Penicillins Other (See Comments)    Yeast infection Has patient had a PCN reaction causing immediate rash, facial/tongue/throat swelling, SOB or lightheadedness with hypotension: No Has patient had a PCN reaction causing severe rash involving mucus membranes or  skin necrosis: No Has patient had a PCN reaction that required hospitalization: Unknown Has patient had a PCN reaction occurring within the last 10 years: No If all of the above answers are "NO", then may proceed with Cephalosporin use.    Allergies as of 04/07/2023       Reactions   Penicillins Other (See Comments)   Yeast infection Has patient had a PCN reaction causing immediate rash, facial/tongue/throat swelling, SOB  or lightheadedness with hypotension: No Has patient had a PCN reaction causing severe rash involving mucus membranes or skin necrosis: No Has patient had a PCN reaction that required hospitalization: Unknown Has patient had a PCN reaction occurring within the last 10 years: No If all of the above answers are "NO", then may proceed with Cephalosporin use.        Medication List        Accurate as of April 07, 2023  1:58 PM. If you have any questions, ask your nurse or doctor.          amLODipine 10 MG tablet Commonly known as: NORVASC TAKE 1 TABLET BY MOUTH EVERY DAY   atorvastatin 40 MG tablet Commonly known as: LIPITOR TAKE 1 TABLET BY MOUTH EVERY DAY   Elderberry Immune Complex Chew Chew 1 tablet by mouth daily.   losartan 50 MG tablet Commonly known as: COZAAR TAKE 1 TABLET BY MOUTH EVERY DAY   metoprolol tartrate 100 MG tablet Commonly known as: LOPRESSOR TAKE 1 TABLET (100 MG TOTAL) BY MOUTH 2 (TWO) TIMES DAILY. TAKE 1 TABLET BY MOUTH 2 HOURS PRIOR TO CT SCAN   Na Sulfate-K Sulfate-Mg Sulf 17.5-3.13-1.6 GM/177ML Soln Commonly known as: Suprep Bowel Prep Kit Take 1 kit by mouth as directed.   potassium chloride SA 20 MEQ tablet Commonly known as: KLOR-CON M Take 1 tablet (20 mEq total) by mouth 2 (two) times daily.   Semaglutide(0.25 or 0.5MG /DOS) 2 MG/3ML Sopn Inject 0.5 mg into the skin once a week.   sertraline 50 MG tablet Commonly known as: ZOLOFT TAKE 1 TABLET BY MOUTH EVERY DAY   traZODone 50 MG tablet Commonly known as: DESYREL TAKE 0.5-1 TABLETS BY MOUTH AT BEDTIME AS NEEDED FOR SLEEP.   Vitamin D3 25 MCG (1000 UT) Caps Take 1 capsule (1,000 Units total) by mouth daily. What changed: how much to take        Review of Systems  Constitutional:  Negative for appetite change, chills, fatigue, fever and unexpected weight change.  HENT:  Negative for congestion, dental problem, ear discharge, ear pain, facial swelling, hearing loss,  nosebleeds, postnasal drip, rhinorrhea, sinus pressure, sinus pain, sneezing, sore throat, tinnitus and trouble swallowing.   Eyes:  Negative for pain, discharge, redness, itching and visual disturbance.  Respiratory:  Negative for cough, chest tightness, shortness of breath and wheezing.   Cardiovascular:  Negative for chest pain, palpitations and leg swelling.  Gastrointestinal:  Negative for abdominal distention, abdominal pain, blood in stool, constipation, diarrhea, nausea and vomiting.  Endocrine: Negative for cold intolerance, heat intolerance, polydipsia, polyphagia and polyuria.  Genitourinary:  Negative for difficulty urinating, dysuria, flank pain, frequency and urgency.  Musculoskeletal:  Negative for arthralgias, back pain, gait problem, joint swelling, myalgias, neck pain and neck stiffness.  Skin:  Negative for color change, pallor, rash and wound.  Neurological:  Negative for dizziness, syncope, speech difficulty, weakness, light-headedness, numbness and headaches.  Hematological:  Does not bruise/bleed easily.  Psychiatric/Behavioral:  Negative for agitation, behavioral problems, confusion, hallucinations, self-injury, sleep disturbance  and suicidal ideas. The patient is not nervous/anxious.     Immunization History  Administered Date(s) Administered   Influenza Whole 07/01/2007   Influenza,inj,Quad PF,6+ Mos 04/22/2012   Tdap 02/22/2020   Pertinent  Health Maintenance Due  Topic Date Due   MAMMOGRAM  12/28/2022   INFLUENZA VACCINE  09/14/2023 (Originally 01/15/2023)   Colonoscopy  01/05/2033      05/03/2020    4:30 AM 05/06/2020    1:00 PM 10/16/2021   12:50 AM 10/16/2021   12:55 PM 09/15/2022   11:02 AM  Fall Risk  Falls in the past year?     0  Was there an injury with Fall?     0  Fall Risk Category Calculator     0  (RETIRED) Patient Fall Risk Level Low fall risk Low fall risk Low fall risk Low fall risk   Patient at Risk for Falls Due to     No Fall Risks    Functional Status Survey:    Vitals:   04/07/23 1340  BP: 132/80  Pulse: 80  Resp: 20  Temp: 98.6 F (37 C)  SpO2: 99%  Weight: 283 lb (128.4 kg)  Height: 5\' 2"  (1.575 m)   Body mass index is 51.76 kg/m. Physical Exam Vitals reviewed.  Constitutional:      General: She is not in acute distress.    Appearance: Normal appearance. She is morbidly obese. She is not ill-appearing or diaphoretic.  HENT:     Head: Normocephalic.     Right Ear: Tympanic membrane, ear canal and external ear normal. There is no impacted cerumen.     Left Ear: Tympanic membrane, ear canal and external ear normal. There is no impacted cerumen.     Nose: Nose normal. No congestion or rhinorrhea.     Mouth/Throat:     Mouth: Mucous membranes are moist.     Pharynx: Oropharynx is clear. No oropharyngeal exudate or posterior oropharyngeal erythema.  Eyes:     General: No scleral icterus.       Right eye: No discharge.        Left eye: No discharge.     Extraocular Movements: Extraocular movements intact.     Conjunctiva/sclera: Conjunctivae normal.     Pupils: Pupils are equal, round, and reactive to light.  Neck:     Vascular: No carotid bruit.  Cardiovascular:     Rate and Rhythm: Normal rate and regular rhythm.     Pulses: Normal pulses.     Heart sounds: Normal heart sounds. No murmur heard.    No friction rub. No gallop.  Pulmonary:     Effort: Pulmonary effort is normal. No respiratory distress.     Breath sounds: Normal breath sounds. No wheezing, rhonchi or rales.  Chest:     Chest wall: No tenderness.  Abdominal:     General: Bowel sounds are normal. There is no distension.     Palpations: Abdomen is soft. There is no mass.     Tenderness: There is no abdominal tenderness. There is no right CVA tenderness, left CVA tenderness, guarding or rebound.  Musculoskeletal:        General: No swelling or tenderness. Normal range of motion.     Cervical back: Normal range of motion. No  rigidity or tenderness.     Right lower leg: No edema.     Left lower leg: No edema.  Lymphadenopathy:     Cervical: No cervical adenopathy.  Skin:  General: Skin is warm and dry.     Coloration: Skin is not pale.     Findings: No bruising, erythema, lesion or rash.  Neurological:     Mental Status: She is alert and oriented to person, place, and time.     Cranial Nerves: No cranial nerve deficit.     Sensory: No sensory deficit.     Motor: No weakness.     Coordination: Coordination normal.     Gait: Gait normal.  Psychiatric:        Mood and Affect: Mood normal.        Speech: Speech normal.        Behavior: Behavior normal.        Thought Content: Thought content normal.        Judgment: Judgment normal.     Labs reviewed: Recent Labs    09/15/22 1157  NA 142  K 4.2  CL 107  CO2 29  GLUCOSE 105*  BUN 11  CREATININE 0.78  CALCIUM 9.7   Recent Labs    09/15/22 1157  AST 16  ALT 17  BILITOT 0.4  PROT 7.8   Recent Labs    09/15/22 1157  WBC 7.0  NEUTROABS 2,842  HGB 14.1  HCT 43.8  MCV 91.1  PLT 317   Lab Results  Component Value Date   TSH 1.94 09/15/2022   Lab Results  Component Value Date   HGBA1C 6.2 (H) 09/29/2022   Lab Results  Component Value Date   CHOL 186 09/15/2022   HDL 45 (L) 09/15/2022   LDLCALC 121 (H) 09/15/2022   TRIG 101 09/15/2022   CHOLHDL 4.1 09/15/2022    Significant Diagnostic Results in last 30 days:  No results found.  Assessment/Plan 1. Essential hypertension Blood pressure well-controlled -Continue on losartan, metoprolol titrate and amlodipine -Continue with dietary modification and exercise at least 30 minutes 3 times per week  2. Mixed hyperlipidemia Previous LDL 121 -Continue on atorvastatin -Dietary modification and exercise as above  3. Morbid obesity (HCC) -BMI 51.76 Continue to follow-up with bariatric general surgery at [facility] -Continue with dietary modification and exercise as  above -Restart Ozempic 0.25 mg for 1 month then increase to 0.5 samples given this visit. - Semaglutide,0.25 or 0.5MG /DOS, (OZEMPIC, 0.25 OR 0.5 MG/DOSE,) 2 MG/3ML SOPN; Inject 0.5 mg into the skin once a week.  Dispense: 3 mL; Refill: 3  4. Type 2 Diabetes with hyperglycemia  Lab Results  Component Value Date   HGBA1C 6.2 (H) 09/29/2022  Will restart Ozempic as above.  To notify provider for any side effects - dietary modification and exercise at least 30 minutes three times per week - Semaglutide,0.25 or 0.5MG /DOS, (OZEMPIC, 0.25 OR 0.5 MG/DOSE,) 2 MG/3ML SOPN; Inject 0.5 mg into the skin once a week.  Dispense: 3 mL; Refill: 3  5. Breast cancer screening by mammogram Asymptomatic - MM 3D SCREENING MAMMOGRAM BILATERAL BREAST  6. Influenza vaccination declined Influenza vaccine discussed during visit but declines  Family/ staff Communication: Reviewed plan of care with patient verbalized understanding  Labs/tests ordered: Has lab orders in place  - MM 3D SCREENING MAMMOGRAM BILATERAL BREAST Next Appointment : Return in about 6 months (around 10/06/2023) for medical mangement of chronic issues., Fasting labs in the morning.   Caesar Bookman, NP

## 2023-04-08 ENCOUNTER — Other Ambulatory Visit: Payer: BC Managed Care – PPO

## 2023-04-08 DIAGNOSIS — R7303 Prediabetes: Secondary | ICD-10-CM | POA: Diagnosis not present

## 2023-04-08 DIAGNOSIS — I10 Essential (primary) hypertension: Secondary | ICD-10-CM | POA: Diagnosis not present

## 2023-04-08 DIAGNOSIS — E782 Mixed hyperlipidemia: Secondary | ICD-10-CM | POA: Diagnosis not present

## 2023-04-09 LAB — COMPLETE METABOLIC PANEL WITH GFR
AG Ratio: 1.2 (calc) (ref 1.0–2.5)
ALT: 20 U/L (ref 6–29)
AST: 20 U/L (ref 10–35)
Albumin: 3.8 g/dL (ref 3.6–5.1)
Alkaline phosphatase (APISO): 103 U/L (ref 37–153)
BUN: 12 mg/dL (ref 7–25)
CO2: 22 mmol/L (ref 20–32)
Calcium: 9.1 mg/dL (ref 8.6–10.4)
Chloride: 107 mmol/L (ref 98–110)
Creat: 0.7 mg/dL (ref 0.50–1.03)
Globulin: 3.2 g/dL (ref 1.9–3.7)
Glucose, Bld: 117 mg/dL — ABNORMAL HIGH (ref 65–99)
Potassium: 4.3 mmol/L (ref 3.5–5.3)
Sodium: 141 mmol/L (ref 135–146)
Total Bilirubin: 0.4 mg/dL (ref 0.2–1.2)
Total Protein: 7 g/dL (ref 6.1–8.1)
eGFR: 101 mL/min/{1.73_m2} (ref >=60)

## 2023-04-09 LAB — LIPID PANEL
Cholesterol: 125 mg/dL (ref <200)
HDL: 44 mg/dL — ABNORMAL LOW (ref >=50)
LDL Cholesterol (Calc): 65 mg/dL
Non-HDL Cholesterol (Calc): 81 mg/dL (ref <130)
Total CHOL/HDL Ratio: 2.8 (calc) (ref <5.0)
Triglycerides: 80 mg/dL (ref <150)

## 2023-04-09 LAB — CBC WITH DIFFERENTIAL/PLATELET
Absolute Lymphocytes: 3760 {cells}/uL (ref 850–3900)
Absolute Monocytes: 498 {cells}/uL (ref 200–950)
Basophils Absolute: 83 {cells}/uL (ref 0–200)
Basophils Relative: 1 %
Eosinophils Absolute: 216 {cells}/uL (ref 15–500)
Eosinophils Relative: 2.6 %
HCT: 39.3 % (ref 35.0–45.0)
Hemoglobin: 12.5 g/dL (ref 11.7–15.5)
MCH: 28.4 pg (ref 27.0–33.0)
MCHC: 31.8 g/dL — ABNORMAL LOW (ref 32.0–36.0)
MCV: 89.3 fL (ref 80.0–100.0)
MPV: 10.3 fL (ref 7.5–12.5)
Monocytes Relative: 6 %
Neutro Abs: 3743 {cells}/uL (ref 1500–7800)
Neutrophils Relative %: 45.1 %
Platelets: 337 10*3/uL (ref 140–400)
RBC: 4.4 10*6/uL (ref 3.80–5.10)
RDW: 13.5 % (ref 11.0–15.0)
Total Lymphocyte: 45.3 %
WBC: 8.3 10*3/uL (ref 3.8–10.8)

## 2023-04-09 LAB — HEMOGLOBIN A1C
Hgb A1c MFr Bld: 6.5 %{Hb} — ABNORMAL HIGH (ref <5.7)
Mean Plasma Glucose: 140 mg/dL
eAG (mmol/L): 7.7 mmol/L

## 2023-04-09 LAB — TSH: TSH: 2.4 m[IU]/L (ref 0.40–4.50)

## 2023-04-13 ENCOUNTER — Other Ambulatory Visit: Payer: Self-pay

## 2023-04-13 ENCOUNTER — Telehealth: Payer: Self-pay | Admitting: *Deleted

## 2023-04-13 DIAGNOSIS — R7309 Other abnormal glucose: Secondary | ICD-10-CM

## 2023-04-13 NOTE — Telephone Encounter (Signed)
Prior Authorization Initiated through [employer]. Went into Determination.  Awaiting decision form insurance.  Patient aware.

## 2023-04-13 NOTE — Telephone Encounter (Signed)
Patient called and stated that her insurance requires a Prior Authorization for Ozempic  Initiated Prior Auth through [employer]MyMeds and the PA is being processed through [facility]. Determination to be made by insurance.   HQI:[id number]

## 2023-04-14 ENCOUNTER — Other Ambulatory Visit (HOSPITAL_COMMUNITY): Payer: Self-pay | Admitting: General Surgery

## 2023-04-14 ENCOUNTER — Telehealth: Payer: Self-pay | Admitting: *Deleted

## 2023-04-14 NOTE — Telephone Encounter (Signed)
Left message to call back to schedule an appt IN OFFICE PRE OP CLEARANCE. Pt last seen 05/2020.

## 2023-04-14 NOTE — Telephone Encounter (Signed)
Pre-operative Risk Assessment    Patient Name: Kaitlyn Whitney  DOB: 27-Apr-1966 MRN: [id number]  DATE OF LAST VISIT: 05/18/20 DR. CHANDRASEKHAR DATE OF NEXT VISIT: NONE   Request for Surgical Clearance    Procedure:   LAPAROSCOPIC BARIATRIC SURGERY  Date of Surgery:  Clearance TBD                                 Surgeon:  DR. Gaynelle Adu Surgeon's Group or Practice Name:  [facility] Phone number:  [phone] Fax number:  [phone] ATTN: [place] HALE, BARIATRIC NAVIGATOR   Type of Clearance Requested:   - Medical ; NO MEDIATIONS INDICATED TO BE HELD PER CLEARANCE FORM   Type of Anesthesia:  General    Additional requests/questions:    Elpidio Anis   04/14/2023, 1:01 PM

## 2023-04-14 NOTE — Telephone Encounter (Signed)
Name: EVA LINQUIST  DOB: 1966/02/06  MRN: [id number]  Primary Cardiologist: None  Chart reviewed as part of pre-operative protocol coverage. Because of JAZELYN RUMBOLD past medical history and time since last visit, she will require a follow-up in-office visit in order to better assess preoperative cardiovascular risk.  Pre-op covering staff: - Please schedule appointment and call patient to inform them. If patient already had an upcoming appointment within acceptable timeframe, please add "pre-op clearance" to the appointment notes so provider is aware. - Please contact requesting surgeon's office via preferred method (i.e, phone, fax) to inform them of need for appointment prior to surgery.    Napoleon Form, Leodis Rains, NP  04/14/2023, 1:10 PM

## 2023-04-15 NOTE — Telephone Encounter (Signed)
2nd attempt to contact the patient to schedule tele cardiac preop clearance. LVM for a call back.

## 2023-04-20 ENCOUNTER — Ambulatory Visit (HOSPITAL_COMMUNITY)
Admission: RE | Admit: 2023-04-20 | Discharge: 2023-04-20 | Disposition: A | Discharge status: Home / Self Care | Payer: BC Managed Care – PPO | Source: Ambulatory Visit | Attending: General Surgery | Admitting: General Surgery

## 2023-04-20 DIAGNOSIS — Z4682 Encounter for fitting and adjustment of non-vascular catheter: Secondary | ICD-10-CM | POA: Diagnosis not present

## 2023-04-20 NOTE — Telephone Encounter (Signed)
3rd attempt to reach pt to schedule in office appointment. Will remove from the pool until patient calls back

## 2023-04-20 NOTE — Telephone Encounter (Signed)
Initiated another Prior Authorization through [employer].  UJWJ:[id number]

## 2023-04-22 ENCOUNTER — Telehealth: Payer: Self-pay

## 2023-04-22 NOTE — Telephone Encounter (Signed)
Noted  

## 2023-04-22 NOTE — Telephone Encounter (Signed)
Ozempic Denial letter received from [facility] (see media tab)

## 2023-04-23 ENCOUNTER — Telehealth: Payer: BC Managed Care – PPO

## 2023-04-23 NOTE — Telephone Encounter (Signed)
Called the insurance about this patient to discussed the PA. I spent 35 minutes on the call because they can not start a PA because it was denied the first time and do not have knowledge of the PA on 04/20/2023 but do not want to start a new one too early.Is there anyway we can try to call again tomorrow to see if received yet

## 2023-04-23 NOTE — Progress Notes (Signed)
Cardiology Office Note    Date:  04/24/2023  ID:  Kaitlyn Whitney, DOB December 21, 1965, MRN [id number] PCP:  Caesar Bookman, NP  Cardiologist:  Christell Constant, MD  Electrophysiologist:  None   Chief Complaint: preop evaluation  History of Present Illness: .    Kaitlyn Whitney is a 57 y.o. female with visit-pertinent history of HTN, HLD, morbid obesity s/p remote gastric surgery, OSA on CPAP, tobacco abuse here for pre-op evaluation for bariatric surgery. She remotely saw Dr. Izora Ribas in 2021 for chest pain following ED eval with negative troponins. Cor CTA was recommended with 3 month f/u but not completed. Patient reports in retrospect chest pain improved with needle manipulation with some of the issues surrounding her prior lap band. No prior echo on file.  She returns for preop eval for bariatric surgery. She denies any chest pain but does have DOE with activities such as climbing stairs that is chronic and unchanged from prior. She occasionally exercises doing a vibration machine or walking on treadmill for 15 minutes. No prior echo or stress testing. Maternal grandfather had stent in his 13s.   Labwork independently reviewed: 03/2023 LDL 65, trig 80, TSH wnl, K 4.3, Cr 0.70, LFTs wnl, CBC OK, A1C 6.5  ROS: .    Please see the history of present illness.  All other systems are reviewed and otherwise negative.  Studies Reviewed: Marland Kitchen    EKG:  EKG is ordered today, personally reviewed, demonstrating NSR 70bpm, nonspecific ST changes, possible prior septal infarct vs normal variant  CV Studies: Cardiac studies reviewed are outlined and summarized above. Otherwise please see EMR for full report.   Current Reported Medications:.    Current Meds  Medication Sig   amLODipine (NORVASC) 10 MG tablet TAKE 1 TABLET BY MOUTH EVERY DAY   atorvastatin (LIPITOR) 40 MG tablet TAKE 1 TABLET BY MOUTH EVERY DAY   Cholecalciferol (VITAMIN D3) 25 MCG (1000 UT) CAPS Take 1 capsule (1,000  Units total) by mouth daily. (Patient taking differently: Take 2,000 Units by mouth daily.)   losartan (COZAAR) 50 MG tablet TAKE 1 TABLET BY MOUTH EVERY DAY   metoprolol tartrate (LOPRESSOR) 100 MG tablet TAKE 1 TABLET (100 MG TOTAL) BY MOUTH 2 (TWO) TIMES DAILY. TAKE 1 TABLET BY MOUTH 2 HOURS PRIOR TO CT SCAN   potassium chloride SA (KLOR-CON M) 20 MEQ tablet Take 1 tablet (20 mEq total) by mouth 2 (two) times daily.   Semaglutide,0.25 or 0.5MG /DOS, (OZEMPIC, 0.25 OR 0.5 MG/DOSE,) 2 MG/3ML SOPN Inject 0.5 mg into the skin once a week.   sertraline (ZOLOFT) 50 MG tablet TAKE 1 TABLET BY MOUTH EVERY DAY   traZODone (DESYREL) 50 MG tablet TAKE 0.5-1 TABLETS BY MOUTH AT BEDTIME AS NEEDED FOR SLEEP.   [DISCONTINUED] Misc Natural Products (ELDERBERRY IMMUNE COMPLEX) CHEW Chew 1 tablet by mouth daily.   [DISCONTINUED] Na Sulfate-K Sulfate-Mg Sulf (SUPREP BOWEL PREP KIT) 17.5-3.13-1.6 GM/177ML SOLN Take 1 kit by mouth as directed.    Physical Exam:    VS:  BP 114/72   Pulse 69   Ht 5\' 2"  (1.575 m)   Wt 281 lb 12.8 oz (127.8 kg)   LMP 12/15/2016 (Approximate)   SpO2 97%   BMI 51.54 kg/m    Wt Readings from Last 3 Encounters:  04/24/23 281 lb 12.8 oz (127.8 kg)  04/07/23 283 lb (128.4 kg)  01/06/23 276 lb (125.2 kg)    GEN: Well nourished, well developed in no acute distress NECK:  No JVD; No carotid bruits CARDIAC: RRR, no murmurs, rubs, gallops RESPIRATORY:  Clear to auscultation without rales, wheezing or rhonchi  ABDOMEN: Soft, non-tender, non-distended EXTREMITIES:  No edema; No acute deformity   Asessement and Plan:.    1. Preoperative cardiovascular evaluation, dyspnea on exertion - RCRI 0 indicating 3.9% 30-day risk of death, MI, or cardiac arrest. She does have slightly abnormal baseline EKG that appears chronic back to 2021. She has dyspnea with higher levels of exertion such as stairs. Recommend screening nuclear stress test and echo before granting OK to proceed with surgery. If  these are reassuring I have no objection to her proceeding from cardiac standpoint. She reports she only takes metoprolol once a day and is unsure which formulation she takes. She takes this at night. I told her to review her med bottle when she gets home and let us know what dose this is. I would recommend she hold this the night before her test. Will route this note to requesting surgeon via Epic fax function.Will need to route finalized clearance upon completion of testing.   Informed Consent   Shared Decision Making/Informed Consent The risks [chest pain, shortness of breath, cardiac arrhythmias, dizziness, blood pressure fluctuations, myocardial infarction, stroke/transient ischemic attack, nausea, vomiting, allergic reaction, radiation exposure, metallic taste sensation and life-threatening complications (estimated to be 1 in 10,000)], benefits (risk stratification, diagnosing coronary artery disease, treatment guidance) and alternatives of a nuclear stress test were discussed in detail with Kaitlyn Whitney and she agrees to proceed. We did discuss that if target HR not achieved, could change to Lexiscan stress test.      2. Essential HTN - blood pressure well controlled on present regimen. As above she will clarify metoprolol rx at home. We discussed that as she loses weight she will need to monitor BP periodically because we sometimes find patients require less BP medication over time.  3. Hyperlipidemia - managed by PCP with recent LDL 65 on statin therapy.     Disposition: F/u with me in 6 weeks. If testing is completely normal would be reasonable to go PRN instead.  Signed, Laurann Montana, PA-C

## 2023-04-23 NOTE — Telephone Encounter (Signed)
Phone message was sent about this patient to [facility] for PA

## 2023-04-24 ENCOUNTER — Ambulatory Visit: Payer: BC Managed Care – PPO | Attending: Physician Assistant | Admitting: Physician Assistant

## 2023-04-24 ENCOUNTER — Telehealth: Payer: Self-pay

## 2023-04-24 ENCOUNTER — Encounter: Payer: Self-pay | Admitting: Physician Assistant

## 2023-04-24 VITALS — BP 114/72 | HR 69 | Ht 62.0 in | Wt 281.8 lb

## 2023-04-24 DIAGNOSIS — E785 Hyperlipidemia, unspecified: Secondary | ICD-10-CM

## 2023-04-24 DIAGNOSIS — I10 Essential (primary) hypertension: Secondary | ICD-10-CM

## 2023-04-24 DIAGNOSIS — Z01810 Encounter for preprocedural cardiovascular examination: Secondary | ICD-10-CM

## 2023-04-24 DIAGNOSIS — R9431 Abnormal electrocardiogram [ECG] [EKG]: Secondary | ICD-10-CM | POA: Diagnosis not present

## 2023-04-24 DIAGNOSIS — R0609 Other forms of dyspnea: Secondary | ICD-10-CM | POA: Diagnosis not present

## 2023-04-24 DIAGNOSIS — G4733 Obstructive sleep apnea (adult) (pediatric): Secondary | ICD-10-CM

## 2023-04-24 NOTE — Telephone Encounter (Signed)
Addition information needed for PA and I have faxed it back and left with porsha, CMA

## 2023-04-24 NOTE — Telephone Encounter (Signed)
Forwarded to [employer].

## 2023-04-24 NOTE — Telephone Encounter (Signed)
Dinah completed BCBS Paperwork for additional clinical information for [employer] Prior authorization.  Paperwork faxed to [place] [facility] [place] Fax: [phone]  Awaiting response from [facility] #[phone] option 3, option 4, option 2  (Paperwork at Clinical intake desk)

## 2023-04-24 NOTE — Telephone Encounter (Signed)
Patient PA needed additional information. The last office notes and labs for type 2 diabetes have been faxed back.

## 2023-04-24 NOTE — Patient Instructions (Addendum)
Medication Instructions:  Your physician recommends that you continue on your current medications as directed. Please refer to the Current Medication list given to you today.  *If you need a refill on your cardiac medications before your next appointment, please call your pharmacy*   Lab Work: NONE If you have labs (blood work) drawn today and your tests are completely normal, you will receive your results only by: MyChart Message (if you have MyChart) OR A paper copy in the mail If you have any lab test that is abnormal or we need to change your treatment, we will call you to review the results.   Testing/Procedures: Your physician has requested that you have an echocardiogram. Echocardiography is a painless test that uses sound waves to create images of your heart. It provides your doctor with information about the size and shape of your heart and how well your heart's chambers and valves are working. This procedure takes approximately one hour. There are no restrictions for this procedure. Please do NOT wear cologne, perfume, aftershave, or lotions (deodorant is allowed). Please arrive 15 minutes prior to your appointment time.  Please note: We ask at that you not bring children with you during ultrasound (echo/ vascular) testing. Due to room size and safety concerns, children are not allowed in the ultrasound rooms during exams. Our front office staff cannot provide observation of children in our lobby area while testing is being conducted. An adult accompanying a patient to their appointment will only be allowed in the ultrasound room at the discretion of the ultrasound technician under special circumstances. We apologize for any inconvenience.   Your physician has requested that you have en exercise stress myoview. For further information please visit [email or web address] Please follow instruction sheet, as given. PLEASE HOLD METOPROLOL 24 HOURS BEFORE TESTING    Follow-Up: At [facility], you and your health needs are our priority.  As part of our continuing mission to provide you with exceptional heart care, we have created designated Provider Care Teams.  These Care Teams include your primary Cardiologist (physician) and Advanced Practice Providers (APPs -  Physician Assistants and Nurse Practitioners) who all work together to provide you with the care you need, when you need it.  We recommend signing up for the patient portal called "MyChart".  Sign up information is provided on this After Visit Summary.  MyChart is used to connect with patients for Virtual Visits (Telemedicine).  Patients are able to view lab/test results, encounter notes, upcoming appointments, etc.  Non-urgent messages can be sent to your provider as well.   To learn more about what you can do with MyChart, go to [email or web address].    Your next appointment:   6 week(s)  Provider:   Christell Constant, MD  OR Ronie Spies, PA-C

## 2023-04-27 NOTE — Telephone Encounter (Signed)
We do have some, is it ok to give sample?  Please Advise.

## 2023-04-28 ENCOUNTER — Other Ambulatory Visit: Payer: Self-pay

## 2023-04-28 DIAGNOSIS — E1165 Type 2 diabetes mellitus with hyperglycemia: Secondary | ICD-10-CM

## 2023-04-28 MED ORDER — OZEMPIC (0.25 OR 0.5 MG/DOSE) 2 MG/3ML ~~LOC~~ SOPN
0.5000 mg | PEN_INJECTOR | SUBCUTANEOUS | Status: DC
Start: 2023-04-28 — End: 2023-06-04

## 2023-04-28 NOTE — Telephone Encounter (Signed)
Patient walked in to request samples of Ozempic (per [employer]) and stated the insurance never received supporting documents to approve Ozempic. Information was resubmitted to [phone]

## 2023-04-29 ENCOUNTER — Telehealth: Payer: Self-pay

## 2023-04-29 NOTE — Telephone Encounter (Signed)
Patient has received an denial letter from Both [facility] and [employer]BS for the Ozempic medication. Called and and informed patient. She states she was told she needs to take another medication first for diabetes. Patient would like to know which medication you recommend.

## 2023-04-29 NOTE — Telephone Encounter (Signed)
Recommend metformin 500 mg tablet one by mouth daily

## 2023-04-30 ENCOUNTER — Other Ambulatory Visit: Payer: Self-pay

## 2023-04-30 DIAGNOSIS — E1165 Type 2 diabetes mellitus with hyperglycemia: Secondary | ICD-10-CM

## 2023-04-30 MED ORDER — METFORMIN HCL 500 MG PO TABS
500.0000 mg | ORAL_TABLET | Freq: Every day | ORAL | 2 refills | Status: DC
Start: 2023-04-30 — End: 2023-11-10

## 2023-04-30 NOTE — Telephone Encounter (Signed)
Attempted to call patient. LVM to see if patient is okay with starting the Metformin

## 2023-04-30 NOTE — Telephone Encounter (Signed)
Patient returned call for metformin 500 mg and it was sent to patients pharmacy.

## 2023-05-01 ENCOUNTER — Other Ambulatory Visit: Payer: Self-pay | Admitting: Family

## 2023-05-01 DIAGNOSIS — F32A Depression, unspecified: Secondary | ICD-10-CM

## 2023-05-01 DIAGNOSIS — I10 Essential (primary) hypertension: Secondary | ICD-10-CM

## 2023-05-01 NOTE — Telephone Encounter (Signed)
Pharmacy requested refill.  Pended and sent to [place] for approval due to HIGH ALERT Warning.

## 2023-05-06 ENCOUNTER — Ambulatory Visit
Admission: RE | Admit: 2023-05-06 | Discharge: 2023-05-06 | Disposition: A | Discharge status: Home / Self Care | Payer: Medicaid Other | Source: Ambulatory Visit | Attending: Family | Admitting: Family

## 2023-05-11 ENCOUNTER — Encounter: Payer: BC Managed Care – PPO | Attending: General Surgery | Admitting: Dietician

## 2023-05-11 ENCOUNTER — Encounter: Payer: Self-pay | Admitting: Dietician

## 2023-05-11 VITALS — Ht 62.0 in | Wt 282.7 lb

## 2023-05-11 DIAGNOSIS — E669 Obesity, unspecified: Secondary | ICD-10-CM

## 2023-05-11 DIAGNOSIS — Z713 Dietary counseling and surveillance: Secondary | ICD-10-CM | POA: Insufficient documentation

## 2023-05-11 NOTE — Telephone Encounter (Signed)
Hello Kaitlyn Whitney,  Please advise on request for mounjero

## 2023-05-11 NOTE — Progress Notes (Signed)
Nutrition Assessment for Bariatric Surgery: Pre-Surgery Behavioral and Nutrition Intervention Program   Medical Nutrition Therapy  Appt Start Time: 2:59    End Time: 4:02  Patient was seen on 05/11/2023 for Pre-Operative Nutrition Assessment. Purpose of todays visit  enhance perioperative outcomes along with a healthy weight maintenance   Referral stated Supervised Weight Loss (SWL) visits needed: 0  Not cleared at this time:  Pt to follow up for minimum of one more visit to assist pt with progressing through stages of change/further nutrition education. RD advised pt that this follow up visit is not mandated through insurance. Pt verbalized agreement.  Planned surgery: Pt states she would like the Sleeve Gastrectomy Pt expectation of surgery: to be 89   NUTRITION ASSESSMENT   Anthropometrics  Start weight at [facility]: 282.7 lbs (date: 05/11/2023)  Height: 62 in BMI: 51.71 kg/m2     Clinical   Pharmacotherapy: History of weight loss medication used: Ozempic  Medical hx: HTN, obesity, sleep apnea; pt denies GERD Medications: amlodipine, atorvastatin, vit D, losartan, metformin, Ozempic, trazodone Labs: HDL 44; glucose 117; A1c 6.5 Notable signs/symptoms: none noted Any previous deficiencies? No  Evaluation of Nutritional Deficiencies: Micronutrient Nutrition Focused Physical Exam: Hair: No issues observed Eyes: No issues observed Mouth: No issues observed Neck: No issues observed Nails: No issues observed Skin: No issues observed  Lifestyle & Dietary Hx  Pt states she is taking Vitamin D.  Pt states her stamina on the treadmill has improved from 5 minutes to 15 minutes. Pt states she works from home.   Current Physical Activity Recommendations state 150 minutes per week of moderate to vigorous movement including Cardio and 1-2 days of resistance activities as well as flexibility/balance activities:  Pts current physical activity: vibration plate (daily for 15 minutes) and  treadmill (daily 15 minutes, with 75% recommendation reached    Sleep Hygiene: duration and quality: sometimes good; sometimes hard time falling asleep. Pt states she uses a CPAP machine.  Current Patient Perceived Stress Level as stated by pt on a scale of 1-10:  4       Stress Management Techniques: push through  According to the Dietary Guidelines for Americans Recommendation: equivalent 1.5-2 cups fruits per day, equivalent 2-3 cups vegetables per day and at least half all grains whole  Fruit servings per day (on average): 1-2, meeting 66-100% recommendation  Non-starchy vegetable servings per day (on average): 1, meeting 33-50% recommendation  Whole Grains per day (on average): 0-1  Number of meals missed/skipped per week out of 21: 7  24-Hr Dietary Recall First Meal: skip or biscuit from fast food Snack: dried fruit Second Meal: noodles (something quick) or fast food Snack:  Third Meal: fast food or home cooked meal with meat, vegetable and rice or potato Snack: candy Beverages: sweet tea, kool-aid, water  Alcoholic beverages per week: 0   Estimated Energy Needs Calories: 1500  NUTRITION DIAGNOSIS  Overweight/obesity ([place]-3.3) related to past poor dietary habits and physical inactivity as evidenced by patient w/ planned sleeve surgery following dietary guidelines for continued weight loss.  NUTRITION INTERVENTION  Nutrition counseling (C-1) and education (E-2) to facilitate bariatric surgery goals.  Educated pt on micronutrient deficiencies post-surgery and behavioral/dietary strategies to start in order to mitigate that risk   Behavioral and Dietary Interventions Pre-Op Goals Reviewed with the Patient Nutrition: Healthy Eating Behaviors Switch to non-caloric, non-carbonated and non-caffeinated beverages such as  water, unsweetened tea, Crystal Light and zero calorie beverages (aim for 64 oz. per day) Cut  out grazing between meals or at night  Find a protein shake you  like Eat every 3-5 hours        Eliminate distractions while eating (TV, computer, reading, driving, texting) Take 40-98 minutes to eat a meal  Decrease high sugar foods/decrease high fat/fried foods Eliminate alcoholic beverages Increase protein intake (eggs, fish, chicken, yogurt) before surgery Eat non starchy vegetables 2 times a day 7 days a week Eat complex carbohydrates such as whole grains and fruits   Behavioral Modification: Physical Activity Increase my usual daily activity (use stairs, park farther, etc.) Engage in _______________________  activity  _______ minutes ______ times per week  Other:    _________________________________________________________________     Problem Solving I will think about my usual eating patterns and how to tweak them How can my friends and family support me Barriers to starting my changes Learn and understand appetite verses hunger   Healthy Coping Allow for ___________ activities per week to help me manage stress Reframe negative thoughts I will keep a picture of someone or something that is my inspiration & look at it daily   Monitoring  Weigh myself once a week  Measure my progress by monitoring how my clothes fit Keep a food record of what I eat and drink for the next ________ (time period) Take pictures of what I eat and drink for the next ________ (time period) Use an app to count steps/day for the next_______ (time period) Measure my progress such as increased energy and more restful sleep Monitor your acid reflux and bowel habits, are they getting better?   *Goals that are bolded indicate the pt would like to start working towards these  Handouts Provided Include  Bariatric Surgery handouts (Nutrition Visits, Pre Surgery Behavioral Change Goals, Protein Shakes Brands to Choose From, Vitamins & Mineral Supplementation)  Learning Style & Readiness for Change Teaching method utilized: Visual, Auditory, and hands on  Demonstrated  degree of understanding via: Teach Back  Readiness Level: preparation Barriers to learning/adherence to lifestyle change: affinity for sweets  RD's Notes for Next Visit Patient progress toward chosen goals.   MONITORING & EVALUATION Dietary intake, weekly physical activity, body weight, and preoperative behavioral change goals   Next Steps  Patient is to follow up at [facility] for another visit to assist pt with progressing through stages of change/further nutrition education.

## 2023-05-26 ENCOUNTER — Telehealth: Payer: Self-pay

## 2023-05-26 NOTE — Telephone Encounter (Signed)
Detailed instructions left on the patient's answering machine. Asked to call back with any questions. S.Aby Gessel CCT

## 2023-05-28 ENCOUNTER — Ambulatory Visit: Payer: Medicaid Other | Admitting: Dietician

## 2023-06-01 ENCOUNTER — Ambulatory Visit (HOSPITAL_COMMUNITY): Payer: BC Managed Care – PPO | Attending: Physician Assistant

## 2023-06-01 DIAGNOSIS — R0609 Other forms of dyspnea: Secondary | ICD-10-CM | POA: Insufficient documentation

## 2023-06-01 MED ORDER — TECHNETIUM TC 99M TETROFOSMIN IV KIT
31.9000 | PACK | Freq: Once | INTRAVENOUS | Status: AC | PRN
  Administered 2023-06-01: 31.9 via INTRAVENOUS

## 2023-06-01 MED ORDER — REGADENOSON 0.4 MG/5ML IV SOLN
0.4000 mg | Freq: Once | INTRAVENOUS | Status: AC
Start: 2023-06-01 — End: 2023-06-01
  Administered 2023-06-01: 0.4 mg via INTRAVENOUS

## 2023-06-02 ENCOUNTER — Ambulatory Visit (HOSPITAL_BASED_OUTPATIENT_CLINIC_OR_DEPARTMENT_OTHER): Payer: BC Managed Care – PPO

## 2023-06-02 ENCOUNTER — Telehealth: Payer: Self-pay | Admitting: Physician Assistant

## 2023-06-02 ENCOUNTER — Ambulatory Visit (HOSPITAL_COMMUNITY): Payer: BC Managed Care – PPO

## 2023-06-02 DIAGNOSIS — R0609 Other forms of dyspnea: Secondary | ICD-10-CM | POA: Diagnosis not present

## 2023-06-02 LAB — MYOCARDIAL PERFUSION IMAGING
Estimated workload: 5
Exercise duration (min): 3 min
LV dias vol: 89 mL (ref 46–106)
LV sys vol: 34 mL
MPHR: 164 {beats}/min
Nuc Stress EF: 62 %
Peak HR: 123 {beats}/min
Percent HR: 75 %
RPE: 19
Rest HR: 78 {beats}/min
Rest Nuclear Isotope Dose: 30.4 mCi
SDS: 2
SRS: 1
SSS: 3
ST Depression (mm): 0 mm
Stress Nuclear Isotope Dose: 31.9 mCi
TID: 0.89

## 2023-06-02 LAB — ECHOCARDIOGRAM COMPLETE
Area-P 1/2: 4.06 cm2
S' Lateral: 3 cm

## 2023-06-02 MED ORDER — TECHNETIUM TC 99M TETROFOSMIN IV KIT
30.4000 | PACK | Freq: Once | INTRAVENOUS | Status: AC | PRN
  Administered 2023-06-02: 30.4 via INTRAVENOUS

## 2023-06-02 NOTE — Telephone Encounter (Signed)
Msg also sent via mychart to relay results to patient. I relayed that if she is feeling well, I am OK if she wants to push her f/u out to 1 year or she may keep 12/23 as scheduled with Dr. Izora Ribas.

## 2023-06-02 NOTE — Telephone Encounter (Addendum)
   Patient Name: Kaitlyn Whitney  DOB: Oct 12, 1965 MRN: [id number]  Primary Cardiologist: Christell Constant, MD  Chart revisited as part of pre-operative protocol coverage. Patient was seen in clinic 04/24/23 for preoperative evaluation at which time stress test and echo were ordered. These have now been completed. Nuclear stress test perfusion imaging was normal. Patient had hypertensive response to exercise likely due to morbid obesity, but imaging was reassuring. Echocardiogram was also reassuring without significant abnormality. Resting blood pressure at time of original office visit was well controlled at 114/72 personally rechecked by me. Therefore, based on ACC/AHA guidelines, the patient would be at acceptable risk for the planned procedure without further cardiovascular testing. Will route this bundled recommendation to requesting provider via Epic fax function. Please call with questions.  Laurann Montana, PA-C 06/02/2023, 3:09 PM

## 2023-06-04 ENCOUNTER — Other Ambulatory Visit: Payer: Self-pay

## 2023-06-04 DIAGNOSIS — E1165 Type 2 diabetes mellitus with hyperglycemia: Secondary | ICD-10-CM

## 2023-06-04 MED ORDER — OZEMPIC (0.25 OR 0.5 MG/DOSE) 2 MG/3ML ~~LOC~~ SOPN
0.5000 mg | PEN_INJECTOR | SUBCUTANEOUS | Status: DC
Start: 2023-06-04 — End: 2023-06-15

## 2023-06-04 NOTE — Telephone Encounter (Signed)
Medication refill was sent to the pharmacy 

## 2023-06-08 ENCOUNTER — Ambulatory Visit: Payer: Medicaid Other | Admitting: Internal Medicine

## 2023-06-15 ENCOUNTER — Other Ambulatory Visit: Payer: Self-pay | Admitting: Family

## 2023-06-15 ENCOUNTER — Telehealth: Payer: Self-pay | Admitting: Family

## 2023-06-15 ENCOUNTER — Telehealth: Payer: Self-pay

## 2023-06-15 DIAGNOSIS — E1165 Type 2 diabetes mellitus with hyperglycemia: Secondary | ICD-10-CM

## 2023-06-15 MED ORDER — WEGOVY 0.25 MG/0.5ML ~~LOC~~ SOAJ
0.2500 mg | SUBCUTANEOUS | 3 refills | Status: DC
Start: 2023-06-15 — End: 2023-10-05

## 2023-06-15 NOTE — Telephone Encounter (Signed)
Message routed to PCP Ngetich, Dinah C, NP  

## 2023-06-15 NOTE — Telephone Encounter (Signed)
Prior Authorization vpn_key [id number] Sent to Plan

## 2023-06-15 NOTE — Progress Notes (Signed)
[facility] sent to the pharmacy.

## 2023-06-15 NOTE — Telephone Encounter (Signed)
[employer] paper work received request for Prior [employer] for [employer]. Please call and obtain prior Authorization.

## 2023-06-15 NOTE — Telephone Encounter (Signed)
I sent prior authorization through CoverMyMeds this morning. They will text patient if medication is confirmed or denied.

## 2023-06-15 NOTE — Telephone Encounter (Signed)
Noted  

## 2023-06-29 ENCOUNTER — Telehealth: Payer: Self-pay | Admitting: *Deleted

## 2023-06-29 NOTE — Telephone Encounter (Signed)
 Received fax from [facility] #[phone] requesting Additional Medical Information for [facility].   Placed in Dinah's folder to review and sign.  Attached Last OV Note.   To be faxed to [facility] once completed [phone]

## 2023-07-01 NOTE — Telephone Encounter (Signed)
 Papers patient sent through Mychart faxed to [facility] [phone]

## 2023-07-20 ENCOUNTER — Other Ambulatory Visit: Payer: BC Managed Care – PPO

## 2023-07-20 DIAGNOSIS — R7309 Other abnormal glucose: Secondary | ICD-10-CM

## 2023-07-21 LAB — HEMOGLOBIN A1C
Hgb A1c MFr Bld: 6.2 %{Hb} — ABNORMAL HIGH (ref <5.7)
Mean Plasma Glucose: 131 mg/dL
eAG (mmol/L): 7.3 mmol/L

## 2023-08-14 NOTE — Telephone Encounter (Signed)
 The P/A that we had on file has been printed and sent to [facility]

## 2023-08-27 ENCOUNTER — Telehealth (HOSPITAL_COMMUNITY): Payer: Self-pay | Admitting: Licensed Clinical Social Worker

## 2023-08-27 ENCOUNTER — Ambulatory Visit (HOSPITAL_COMMUNITY): Payer: Self-pay | Admitting: Licensed Clinical Social Worker

## 2023-08-27 NOTE — Telephone Encounter (Signed)
 Left message for patient requesting call back to discuss rescheduling today's 4 PM appointment to 6 PM or to a different day.

## 2023-09-02 ENCOUNTER — Encounter (HOSPITAL_COMMUNITY): Payer: Self-pay

## 2023-09-02 ENCOUNTER — Ambulatory Visit (HOSPITAL_COMMUNITY): Admitting: Licensed Clinical Social Worker

## 2023-09-08 ENCOUNTER — Ambulatory Visit (HOSPITAL_COMMUNITY): Admitting: Licensed Clinical Social Worker

## 2023-09-08 DIAGNOSIS — F4322 Adjustment disorder with anxiety: Secondary | ICD-10-CM
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
## 2023-09-08 NOTE — Progress Notes (Signed)
 1

## 2023-09-29 ENCOUNTER — Encounter: Payer: Self-pay | Admitting: Skilled Nursing Facility1

## 2023-09-29 ENCOUNTER — Encounter: Attending: General Surgery | Admitting: Skilled Nursing Facility1

## 2023-09-29 NOTE — Progress Notes (Signed)
 Supervised Weight Loss Visit Bariatric Nutrition Education Appt Start Time: 10:26    End Time: 11:15  Planned Surgery: Sleeve form Lapband  Pt Expectation of Surgery/ Goals: to lose weight and be more active   Lapband about 10-15 years ago  Pt completed visits.   Pt has cleared nutrition requirements.   Pt has completed visits. No further supervised visits required/recommended.  NUTRITION ASSESSMENT  Anthropometrics  Start weight at [facility]: 281 lbs (date: 04/23/2024) Today's weight: 276 lbs Weight change: -5 lbs (since previous visit on 05/11/2023) BMI: 50.48 kg/m2    Clinical   Pharmacotherapy: History of weight loss medication used: Ozempic  Medical hx: HTN, obesity, sleep apnea; pt denies GERD Medications: amlodipine, atorvastatin, vit D, losartan, trazodone Labs: HDL 44; A1c 6.2 Notable signs/symptoms: none noted Any previous deficiencies? No  Lifestyle & Dietary Hx  Pt states her husband wants to travel and wants to travel with him. Pt states her husband has been motivated to move more and take better care of himself so she does not want to be left behind.   Pt states she has cut back on being a candy junky at night choosing fruit snacks instead. Pt states she loves cheetoes. Pt states she is currently looking for work. Pt states she stopped smoking for a few years now.  Pt states her husband is really encouraging.   Estimated daily fluid intake:  oz Supplements:  Current average weekly physical activity: ADL's  24-Hr Dietary Recall: wakes around 11am, goes to bed around 3:30-4am First Meal: skipped Snack: cereal or oatmeal Second Meal: fruit + sandwich  Snack: cheetoes  Third Meal: mixed vegetables + mashed potatoes + steak Snack: fruit snacks Beverages: mushroom coffee, flavored water, juice  Estimated Energy Needs Calories: 1500   NUTRITION DIAGNOSIS  Overweight/obesity ([place]-3.3) related to past poor dietary habits and physical inactivity as evidenced  by patient w/ planned sleeve from Lapband surgery following dietary guidelines for continued weight loss.   NUTRITION INTERVENTION  Nutrition counseling (C-1) and education (E-2) to facilitate bariatric surgery goals.  Pre-Op Goals Progress & New Goals I will stay awake throughout the day then go to sleep at 12am I will do my workouts when I am feeling a nap coming on  I will avoid meal skipping I will eat whole foods   Handouts Provided Include  Detailed MyPlate  Learning Style & Readiness for Change Teaching method utilized: Visual & Auditory  Demonstrated degree of understanding via: Teach Back  Readiness Level: action Barriers to learning/adherence to lifestyle change: none identified    MONITORING & EVALUATION Dietary intake, weekly physical activity, body weight, and pre-op goals in 1 month.   Next Steps  Patient is to return to [facility] for pre-op class education.

## 2023-10-05 ENCOUNTER — Ambulatory Visit (INDEPENDENT_AMBULATORY_CARE_PROVIDER_SITE_OTHER): Payer: BC Managed Care – PPO | Admitting: Family

## 2023-10-05 ENCOUNTER — Encounter: Payer: Self-pay | Admitting: Family

## 2023-10-05 VITALS — BP 136/88 | HR 74 | Temp 97.9°F | Resp 20 | Ht 62.0 in | Wt 278.0 lb

## 2023-10-05 DIAGNOSIS — E782 Mixed hyperlipidemia: Secondary | ICD-10-CM | POA: Diagnosis not present

## 2023-10-05 DIAGNOSIS — R7303 Prediabetes: Secondary | ICD-10-CM

## 2023-10-05 DIAGNOSIS — E559 Vitamin D deficiency, unspecified: Secondary | ICD-10-CM

## 2023-10-05 DIAGNOSIS — F419 Anxiety disorder, unspecified: Secondary | ICD-10-CM | POA: Diagnosis not present

## 2023-10-05 DIAGNOSIS — I10 Essential (primary) hypertension: Secondary | ICD-10-CM

## 2023-10-05 DIAGNOSIS — F32A Depression, unspecified: Secondary | ICD-10-CM

## 2023-10-05 NOTE — Progress Notes (Signed)
 Provider: Christean Courts FNP-C   Erina Hamme, Elijio Guadeloupe, NP  Patient Care Team: Monet North, Elijio Guadeloupe, NP as PCP - General (Family Medicine) Jann Melody, MD as PCP - Cardiology (Cardiology) Dohmeier, Raoul Byes, MD as Consulting Physician (Neurology)  Extended Emergency Contact Information Primary Emergency Contact: [facility] Address: [address]          [place], [place] [address] [place] Home Phone: [phone] Mobile Phone: [phone] Relation: Spouse Secondary Emergency Contact: Blanding,Timeka Address: [address]  [place] Home Phone: [phone] Relation: Sister  Code Status:  Full Code  Goals of care: Advanced Directive information    10/05/2023    9:32 AM  Advanced Directives  Does Patient Have a Medical Advance Directive? No  Would patient like information on creating a medical advance directive? No - Patient declined     Chief Complaint  Patient presents with   Medical Management of Chronic Issues    6 month follow up.      Discussed the use of AI scribe software for clinical note transcription with the patient, who gave verbal consent to proceed.  History of Present Illness   Kaitlyn Whitney is a 58 year old female who presents for a six-month follow-up visit.  She is in the process of scheduling surgery to remove her lap band and replace it with a sleeve. She completed her pre-surgical pathway last week and is awaiting a surgery date. She was evaluated by a cardiologist in December for surgical clearance.  Her current medications include potassium 20 mEq, trazodone  at night, vitamin D 2000 IU, amlodipine  10 mg, atorvastatin  40 mg daily, metoprolol  200 mg twice daily, and metformin , which she has not taken recently due to a lapse in prescription refill. She has called in the prescription for metformin . She is not taking Wegovy  as it is not covered by insurance.  Her blood pressure readings at home are typically  around 127/80 to 127/90, which she considers normal. No chest pain, dizziness, or shortness of breath. She also denies any issues with urination.  She experiences nerve pain but no muscle pain. She has a history of nerve damage and reports numbness and tingling in her legs. There is no swelling in her legs.  Her husband was recently diagnosed with early-stage prostate cancer, which has been a source of stress for her.  Her recent lab work from October 24th showed an improvement in her A1c from 6.5 to 6.2 over six months. Her cholesterol levels were mostly within target ranges, except for HDL, which was 44 and needs to be higher. She has been engaging in more exercise recently, including using a new machine for squats and walking, which she hopes will improve her HDL levels.    Past Medical History:  Diagnosis Date   Anxiety    Arthritis    hands   Chronic kidney disease    kidney stones   GERD (gastroesophageal reflux disease)    History of kidney stones 2016   Hyperlipidemia    Hypertension    Obesity    Restless leg syndrome    Sleep apnea    c-pap   Past Surgical History:  Procedure Laterality Date   bone spur removal     left side of lumbar spine   BREAST SURGERY  2000   breast reduction   COLONOSCOPY     COLONOSCOPY WITH PROPOFOL  N/A 01/06/2023   Procedure: COLONOSCOPY WITH PROPOFOL ;  Surgeon: Albertina Hugger, MD;  Location: [facility];  Service: Gastroenterology;  Laterality: N/A;   HYSTERECTOMY ABDOMINAL WITH SALPINGECTOMY Bilateral 08/18/2017   Procedure: SUPRACERVICAL HYSTERECTOMY ABDOMINAL;  Surgeon: Othelia Blinks, MD;  Location: [facility];  Service: Gynecology;  Laterality: Bilateral;   LAPAROSCOPIC GASTRIC BANDING  ~ 2010   LYSIS OF ADHESION N/A 08/18/2017   Procedure: LYSIS OF ADHESION;  Surgeon: Othelia Blinks, MD;  Location: [facility];  Service: Gynecology;  Laterality: N/A;   POLYPECTOMY  01/06/2023   Procedure: POLYPECTOMY;  Surgeon: Albertina Hugger,  MD;  Location: [facility];  Service: Gastroenterology;;   REDUCTION MAMMAPLASTY Bilateral    SALPINGOOPHORECTOMY Bilateral 08/18/2017   Procedure: SALPINGO OOPHORECTOMY;  Surgeon: Othelia Blinks, MD;  Location: [facility];  Service: Gynecology;  Laterality: Bilateral;   TUBAL LIGATION  2005    Allergies  Allergen Reactions   Penicillins Other (See Comments)    Yeast infection Has patient had a PCN reaction causing immediate rash, facial/tongue/throat swelling, SOB or lightheadedness with hypotension: No Has patient had a PCN reaction causing severe rash involving mucus membranes or skin necrosis: No Has patient had a PCN reaction that required hospitalization: Unknown Has patient had a PCN reaction occurring within the last 10 years: No If all of the above answers are "NO", then may proceed with Cephalosporin use.    Allergies as of 10/05/2023       Reactions   Penicillins Other (See Comments)   Yeast infection Has patient had a PCN reaction causing immediate rash, facial/tongue/throat swelling, SOB or lightheadedness with hypotension: No Has patient had a PCN reaction causing severe rash involving mucus membranes or skin necrosis: No Has patient had a PCN reaction that required hospitalization: Unknown Has patient had a PCN reaction occurring within the last 10 years: No If all of the above answers are "NO", then may proceed with Cephalosporin use.        Medication List        Accurate as of October 05, 2023 10:20 AM. If you have any questions, ask your nurse or doctor.          STOP taking these medications    Wegovy  0.25 MG/0.5ML Soaj Generic drug: Semaglutide -Weight Management Stopped by: Kayra Crowell C Zalen Sequeira       TAKE these medications    amLODipine  10 MG tablet Commonly known as: NORVASC  TAKE 1 TABLET BY MOUTH EVERY DAY   atorvastatin  40 MG tablet Commonly known as: LIPITOR TAKE 1 TABLET BY MOUTH EVERY DAY   losartan  50 MG tablet Commonly known as:  COZAAR  TAKE 1 TABLET BY MOUTH EVERY DAY   metFORMIN  500 MG tablet Commonly known as: GLUCOPHAGE  Take 1 tablet (500 mg total) by mouth daily.   metoprolol  tartrate 100 MG tablet Commonly known as: LOPRESSOR  TAKE 1 TABLET (100 MG TOTAL) BY MOUTH 2 (TWO) TIMES DAILY. TAKE 1 TABLET BY MOUTH 2 HOURS PRIOR TO CT SCAN   potassium chloride  SA 20 MEQ tablet Commonly known as: KLOR-CON  M Take 1 tablet (20 mEq total) by mouth 2 (two) times daily.   sertraline  50 MG tablet Commonly known as: ZOLOFT  TAKE 1 TABLET BY MOUTH EVERY DAY   traZODone  50 MG tablet Commonly known as: DESYREL  TAKE 0.5-1 TABLETS BY MOUTH AT BEDTIME AS NEEDED FOR SLEEP.   Vitamin D3 25 MCG (1000 UT) Caps Take 1 capsule (1,000 Units total) by mouth daily. What changed: how much to take        Review of Systems  Constitutional:  Negative for appetite change,  chills, fatigue, fever and unexpected weight change.  HENT:  Negative for congestion, dental problem, ear discharge, ear pain, facial swelling, hearing loss, nosebleeds, postnasal drip, rhinorrhea, sinus pressure, sinus pain, sneezing, sore throat, tinnitus and trouble swallowing.   Eyes:  Negative for pain, discharge, redness, itching and visual disturbance.  Respiratory:  Negative for cough, chest tightness, shortness of breath and wheezing.   Cardiovascular:  Negative for chest pain, palpitations and leg swelling.  Gastrointestinal:  Negative for abdominal distention, abdominal pain, blood in stool, constipation, diarrhea, nausea and vomiting.  Endocrine: Negative for cold intolerance, heat intolerance, polydipsia, polyphagia and polyuria.  Genitourinary:  Negative for difficulty urinating, dysuria, flank pain, frequency and urgency.  Musculoskeletal:  Negative for arthralgias, back pain, gait problem, joint swelling, myalgias, neck pain and neck stiffness.  Skin:  Negative for color change, pallor, rash and wound.  Neurological:  Negative for dizziness,  syncope, speech difficulty, weakness, light-headedness, numbness and headaches.  Hematological:  Does not bruise/bleed easily.  Psychiatric/Behavioral:  Negative for agitation, behavioral problems, confusion, hallucinations, self-injury, sleep disturbance and suicidal ideas. The patient is not nervous/anxious.     Immunization History  Administered Date(s) Administered   Influenza Whole 07/01/2007   Influenza,inj,Quad PF,6+ Mos 04/22/2012   Tdap 02/22/2020   Pertinent  Health Maintenance Due  Topic Date Due   INFLUENZA VACCINE  01/15/2024   MAMMOGRAM  05/05/2025   Colonoscopy  01/05/2033      10/16/2021   12:50 AM 10/16/2021   12:55 PM 09/15/2022   11:02 AM 05/11/2023    3:07 PM 10/05/2023    9:31 AM  Fall Risk  Falls in the past year?   0 0 0  Was there an injury with Fall?   0  0  Fall Risk Category Calculator   0  0  (RETIRED) Patient Fall Risk Level Low fall risk Low fall risk     Patient at Risk for Falls Due to   No Fall Risks  No Fall Risks  Fall risk Follow up     Falls evaluation completed   Functional Status Survey:    Vitals:   10/05/23 0934  BP: 136/88  Pulse: 74  Resp: 20  Temp: 97.9 F (36.6 C)  SpO2: 97%  Weight: 278 lb (126.1 kg)  Height: 5\' 2"  (1.575 m)   Body mass index is 50.85 kg/m. Physical Exam  VITALS: T- 97.9, P- 74, BP- 136/88, SaO2- 97% MEASUREMENTS: Weight- 278. GENERAL: Alert, cooperative, well developed, no acute distress HEENT: Normocephalic, normal oropharynx, moist mucous membranes, ears normal without infection, nose normal, no sinus tenderness NECK: Neck normal CHEST: Clear to auscultation bilaterally, no wheezes, rhonchi, or crackles CARDIOVASCULAR: Normal heart rate and rhythm, S1 and S2 normal without murmurs ABDOMEN: Soft, non-tender, non-distended, without organomegaly, normal bowel sounds EXTREMITIES: No cyanosis or edema NEUROLOGICAL: Cranial nerves grossly intact, moves all extremities without gross motor or sensory  deficit, extraocular movements intact  SKIN: No rash,no lesion or erythema   PSYCHIATRY/BEHAVIORAL: Mood stable    Labs reviewed: Recent Labs    04/08/23 0831  NA 141  K 4.3  CL 107  CO2 22  GLUCOSE 117*  BUN 12  CREATININE 0.70  CALCIUM  9.1   Recent Labs    04/08/23 0831  AST 20  ALT 20  BILITOT 0.4  PROT 7.0   Recent Labs    04/08/23 0831  WBC 8.3  NEUTROABS 3,743  HGB 12.5  HCT 39.3  MCV 89.3  PLT 337   Lab  Results  Component Value Date   TSH 2.40 04/08/2023   Lab Results  Component Value Date   HGBA1C 6.2 (H) 07/20/2023   Lab Results  Component Value Date   CHOL 125 04/08/2023   HDL 44 (L) 04/08/2023   LDLCALC 65 04/08/2023   TRIG 80 04/08/2023   CHOLHDL 2.8 04/08/2023    Significant Diagnostic Results in last 30 days:  No results found.  Assessment/Plan  Morbid Obesity - BMI 50.85 with comorbidity HTN,DM and HLD She is in the process of scheduling surgery to remove the lap band and replace it with a gastric sleeve. She has completed the necessary pre-surgical pathway and is awaiting a surgery date. No current issues or pain related to the lap band. Cleared for surgery by cardiology. - Schedule surgery for lap band removal and gastric sleeve placement.  Hypertension Blood pressure today was 136/88 mmHg, higher than her usual 127/80 mmHg. No symptoms of chest pain, dizziness, or dyspnea. Current medications include amlodipine  and metoprolol .  Prediabetes A1c improved from 6.5% six months ago to 6.2% two months ago. Not currently taking metformin  due to a lapse in prescription refill but plans to pick it up soon. Insurance does not cover Wegovy . - Ensure metformin  prescription is refilled and picked up.  Hyperlipidemia Cholesterol levels well-controlled with atorvastatin . Total cholesterol is 125 mg/dL, LDL is 65 mg/dL, triglycerides are 80 mg/dL. HDL is 44 mg/dL, below the desired level of 50 mg/dL. Encouraged to increase exercise to improve  HDL levels. - Encourage regular exercise to increase HDL levels.  Anxiety and depression  - Husband recently diagnosed with Prostate cancer Coping well  - mood stable   General Health Maintenance Immunizations are up to date except for COVID-19 and shingles vaccines. Lab work from October showed good kidney, liver, and thyroid  function. Vitamin D supplementation is ongoing, and levels will be rechecked. - advised to get  COVID-19 vaccine at the pharmacy  - advised to get  shingles vaccine at the pharmacy  - Order lab work to recheck vitamin D levels.  Follow-up She will return for lab work after fasting, as she ate before today's appointment. She will also schedule her regular six-month follow-up appointment. - Schedule lab work for tomorrow after fasting. - Schedule six-month follow-up appointment.       Family/ staff Communication: Reviewed plan of care with patient  Labs/tests ordered: None   Next Appointment : Return in about 6 months (around 04/05/2024) for medical mangement of chronic issues., Fasting labs in the morning.   Spent 30 minutes of Face to face and non-face to face with patient  >50% time spent counseling; reviewing medical record; tests; labs; documentation and developing future plan of care.   Estil Heman, NP

## 2023-10-06 ENCOUNTER — Other Ambulatory Visit

## 2023-10-14 DIAGNOSIS — G4733 Obstructive sleep apnea (adult) (pediatric): Secondary | ICD-10-CM | POA: Diagnosis not present

## 2023-10-14 DIAGNOSIS — E785 Hyperlipidemia, unspecified: Secondary | ICD-10-CM | POA: Diagnosis not present

## 2023-10-14 DIAGNOSIS — I10 Essential (primary) hypertension: Secondary | ICD-10-CM | POA: Diagnosis not present

## 2023-10-19 ENCOUNTER — Encounter: Attending: General Surgery | Admitting: Skilled Nursing Facility1

## 2023-10-20 ENCOUNTER — Encounter: Payer: Self-pay | Admitting: Skilled Nursing Facility1

## 2023-10-20 NOTE — Progress Notes (Signed)
 Pre-Operative Nutrition Class:    Patient was seen on 10/20/2023 for Pre-Operative Bariatric Surgery Education at the [facility]    Surgery date:  Surgery type: Band to Sleeve Start weight at [facility]: 276 Weight today: 277  The following the learning objectives were met by the patient during this course: Identify Pre-Op Dietary Goals and will begin 2 weeks pre-operatively Identify appropriate sources of fluids and proteins  State protein recommendations and appropriate sources pre and post-operatively Identify Post-Operative Dietary Goals and will follow for 2 weeks post-operatively Identify appropriate multivitamin and calcium  sources Describe the need for physical activity post-operatively and will follow MD recommendations State when to call healthcare provider regarding medication questions or post-operative complications When having a diagnosis of diabetes understanding hypoglycemia symptoms and the inclusion of 1 complex carbohydrate per meal  Handouts given during class include: Pre-Op Bariatric Surgery Diet Handout Protein Shake Handout Post-Op Bariatric Surgery Nutrition Handout BELT Program Information Flyer Support Group Information Flyer [facility] Bariatric Supplements Price List  Follow-Up Plan: Patient will follow-up at [facility] 2 weeks post operatively for diet advancement per MD.

## 2023-10-29 ENCOUNTER — Ambulatory Visit: Payer: Self-pay | Admitting: General Surgery

## 2023-10-29 DIAGNOSIS — I10 Essential (primary) hypertension: Secondary | ICD-10-CM

## 2023-10-29 NOTE — Progress Notes (Signed)
 Surgery orders requested via Epic inbox.

## 2023-11-02 NOTE — Patient Instructions (Signed)
 SURGICAL WAITING ROOM VISITATION  Patients having surgery or a procedure may have no more than 2 support people in the waiting area - these visitors may rotate.    Children under the age of 28 must have an adult with them who is not the patient.  Due to an increase in RSV and influenza rates and associated hospitalizations, children ages 75 and under may not visit patients in [facility] hospitals.  Visitors with respiratory illnesses are discouraged from visiting and should remain at home.  If the patient needs to stay at the hospital during part of their recovery, the visitor guidelines for inpatient rooms apply. Pre-op nurse will coordinate an appropriate time for 1 support person to accompany patient in pre-op.  This support person may not rotate.    Please refer to the [facility] website for the visitor guidelines for Inpatients (after your surgery is over and you are in a regular room).       Your procedure is scheduled on: 11/10/23   Report to [facility] Main Entrance    Report to admitting at  8:45 AM   Call this number if you have problems the morning of surgery [phone]   Do not eat food : after 6 PM   After Midnight you may have the following liquids until 8AM DAY OF SURGERY  Water Non-Citrus Juices (without pulp, NO RED-Apple, White grape, White cranberry) Black Coffee (NO MILK/CREAM OR CREAMERS, sugar ok)  Clear Tea (NO MILK/CREAM OR CREAMERS, sugar ok) regular and decaf                             Plain Jell-O (NO RED)                                           Fruit ices (not with fruit pulp, NO RED)                                     Popsicles (NO RED)                                                               Sports drinks like Gatorade (NO RED)                The day of surgery:  Drink ONE (1) Pre-Surgery G2 at 8 AM the morning of surgery. Drink in one sitting. Do not sip.  This drink was given to you during your hospital  pre-op appointment  visit. Nothing else to drink after completing the  Pre-Surgery G2.          If you have questions, please contact your surgeon's office.   FOLLOW BOWEL PREP AND ANY ADDITIONAL PRE OP INSTRUCTIONS YOU RECEIVED FROM YOUR SURGEON'S OFFICE!!!     Oral Hygiene is also important to reduce your risk of infection.  Remember - BRUSH YOUR TEETH THE MORNING OF SURGERY WITH YOUR REGULAR TOOTHPASTE   Stop all vitamins and herbal supplements 7 days before surgery.   Take these medicines the morning of surgery with A SIP OF WATER: Amlodipine , Atorvastatin , metoprolol , Zoloft   DO NOT TAKE ANY ORAL DIABETIC MEDICATIONS DAY OF YOUR SURGERY Hold Metformin  the morning of surgery.  Bring CPAP mask and tubing day of surgery.                              You may not have any metal on your body including hair pins, jewelry, and body piercing             Do not wear make-up, lotions, powders, perfumes/cologne, or deodorant  Do not wear nail polish including gel and S&S, artificial/acrylic nails, or any other type of covering on natural nails including finger and toenails. If you have artificial nails, gel coating, etc. that needs to be removed by a nail salon please have this removed prior to surgery or surgery may need to be canceled/ delayed if the surgeon/ anesthesia feels like they are unable to be safely monitored.   Do not shave  48 hours prior to surgery.    Do not bring valuables to the hospital. [facility] IS NOT             RESPONSIBLE   FOR VALUABLES.   Contacts, glasses, dentures or bridgework may not be worn into surgery.   Bring small overnight bag day of surgery.   DO NOT BRING YOUR HOME MEDICATIONS TO THE HOSPITAL. PHARMACY WILL DISPENSE MEDICATIONS LISTED ON YOUR MEDICATION LIST TO YOU DURING YOUR ADMISSION IN THE HOSPITAL!    Patients discharged on the day of surgery will not be allowed to drive home.  Someone NEEDS to stay with you for the first 24  hours after anesthesia.   Special Instructions: Bring a copy of your healthcare power of attorney and living will documents the day of surgery if you haven't scanned them before.              Please read over the following fact sheets you were given: IF YOU HAVE QUESTIONS ABOUT YOUR PRE-OP INSTRUCTIONS PLEASE CALL [phone]   If you received a COVID test during your pre-op visit  it is requested that you wear a mask when out in public, stay away from anyone that may not be feeling well and notify your surgeon if you develop symptoms. If you test positive for Covid or have been in contact with anyone that has tested positive in the last 10 days please notify you surgeon.    [facility] - Preparing for Surgery Before surgery, you can play an important role.  Because skin is not sterile, your skin needs to be as free of germs as possible.  You can reduce the number of germs on your skin by washing with CHG (chlorahexidine gluconate) soap before surgery.  CHG is an antiseptic cleaner which kills germs and bonds with the skin to continue killing germs even after washing. Please DO NOT use if you have an allergy to CHG or antibacterial soaps.  If your skin becomes reddened/irritated stop using the CHG and inform your nurse when you arrive at Short Stay. Do not shave (including legs and underarms) for at least 48 hours prior to the first CHG shower.  You may shave your face/neck.  Please follow these instructions carefully:  1.  Shower with CHG Soap the night before surgery and the  morning of surgery.  2.  If you choose to wash your hair, wash your hair first as usual with your normal  shampoo.  3.  After you shampoo, rinse your hair and body thoroughly to remove the shampoo.                             4.  Use CHG as you would any other liquid soap.  You can apply chg directly to the skin and wash.  Gently with a scrungie or clean washcloth.  5.  Apply the CHG Soap to your body ONLY FROM THE NECK  DOWN.   Do   not use on face/ open                           Wound or open sores. Avoid contact with eyes, ears mouth and   genitals (private parts).                       Wash face,  Genitals (private parts) with your normal soap.             6.  Wash thoroughly, paying special attention to the area where your    surgery  will be performed.  7.  Thoroughly rinse your body with warm water from the neck down.  8.  DO NOT shower/wash with your normal soap after using and rinsing off the CHG Soap.                9.  Pat yourself dry with a clean towel.            10.  Wear clean pajamas.            11.  Place clean sheets on your bed the night of your first shower and do not  sleep with pets. Day of Surgery : Do not apply any lotions/deodorants the morning of surgery.  Please wear clean clothes to the hospital/surgery center.  FAILURE TO FOLLOW THESE INSTRUCTIONS MAY RESULT IN THE CANCELLATION OF YOUR SURGERY  PATIENT SIGNATURE_________________________________  NURSE SIGNATURE__________________________________  ________________________________________________________________________Incentive Lila Regal (Watch this video at home: [email or web address])  An incentive spirometer is a tool that can help keep your lungs clear and active. This tool measures how well you are filling your lungs with each breath. Taking long deep breaths may help reverse or decrease the chance of developing breathing (pulmonary) problems (especially infection) following: A long period of time when you are unable to move or be active. BEFORE THE PROCEDURE  If the spirometer includes an indicator to show your best effort, your nurse or respiratory therapist will set it to a desired goal. If possible, sit up straight or lean slightly forward. Try not to slouch. Hold the incentive spirometer in an upright position. INSTRUCTIONS FOR USE  Sit on the edge of your bed if possible, or sit up as far as  you can in bed or on a chair. Hold the incentive spirometer in an upright position. Breathe out normally. Place the mouthpiece in your mouth and seal your lips tightly around it. Breathe in slowly and as deeply as possible, raising the piston or the ball toward the top of the column. Hold your breath for 3-5 seconds or for as long as possible. Allow the piston or ball to fall to  the bottom of the column. Remove the mouthpiece from your mouth and breathe out normally. Rest for a few seconds and repeat Steps 1 through 7 at least 10 times every 1-2 hours when you are awake. Take your time and take a few normal breaths between deep breaths. The spirometer may include an indicator to show your best effort. Use the indicator as a goal to work toward during each repetition. After each set of 10 deep breaths, practice coughing to be sure your lungs are clear. If you have an incision (the cut made at the time of surgery), support your incision when coughing by placing a pillow or rolled up towels firmly against it. Once you are able to get out of bed, walk around indoors and cough well. You may stop using the incentive spirometer when instructed by your caregiver.  RISKS AND COMPLICATIONS Take your time so you do not get dizzy or light-headed. If you are in pain, you may need to take or ask for pain medication before doing incentive spirometry. It is harder to take a deep breath if you are having pain. AFTER USE Rest and breathe slowly and easily. It can be helpful to keep track of a log of your progress. Your caregiver can provide you with a simple table to help with this. If you are using the spirometer at home, follow these instructions: SEEK MEDICAL CARE IF:  You are having difficultly using the spirometer. You have trouble using the spirometer as often as instructed. Your pain medication is not giving enough relief while using the spirometer. You develop fever of 100.5 F (38.1 C) or higher. SEEK  IMMEDIATE MEDICAL CARE IF:  You cough up bloody sputum that had not been present before. You develop fever of 102 F (38.9 C) or greater. You develop worsening pain at or near the incision site. MAKE SURE YOU:  Understand these instructions. Will watch your condition. Will get help right away if you are not doing well or get worse. Document Released: 10/13/2006 Document Revised: 08/25/2011 Document Reviewed: 12/14/2006 [facility] Patient Information 2014 [place], [place]. WHAT IS A BLOOD TRANSFUSION? Blood Transfusion Information  A transfusion is the replacement of blood or some of its parts. Blood is made up of multiple cells which provide different functions. Red blood cells carry oxygen and are used for blood loss replacement. White blood cells fight against infection. Platelets control bleeding. Plasma helps clot blood. Other blood products are available for specialized needs, such as hemophilia or other clotting disorders. BEFORE THE TRANSFUSION  Who gives blood for transfusions?  Healthy volunteers who are fully evaluated to make sure their blood is safe. This is blood bank blood. Transfusion therapy is the safest it has ever been in the practice of medicine. Before blood is taken from a donor, a complete history is taken to make sure that person has no history of diseases nor engages in risky social behavior (examples are intravenous drug use or sexual activity with multiple partners). The donor's travel history is screened to minimize risk of transmitting infections, such as malaria. The donated blood is tested for signs of infectious diseases, such as HIV and hepatitis. The blood is then tested to be sure it is compatible with you in order to minimize the chance of a transfusion reaction. If you or a relative donates blood, this is often done in anticipation of surgery and is not appropriate for emergency situations. It takes many days to process the donated blood. RISKS AND  COMPLICATIONS Although  transfusion therapy is very safe and saves many lives, the main dangers of transfusion include:  Getting an infectious disease. Developing a transfusion reaction. This is an allergic reaction to something in the blood you were given. Every precaution is taken to prevent this. The decision to have a blood transfusion has been considered carefully by your caregiver before blood is given. Blood is not given unless the benefits outweigh the risks. AFTER THE TRANSFUSION Right after receiving a blood transfusion, you will usually feel much better and more energetic. This is especially true if your red blood cells have gotten low (anemic). The transfusion raises the level of the red blood cells which carry oxygen, and this usually causes an energy increase. The nurse administering the transfusion will monitor you carefully for complications. HOME CARE INSTRUCTIONS  No special instructions are needed after a transfusion. You may find your energy is better. Speak with your caregiver about any limitations on activity for underlying diseases you may have. SEEK MEDICAL CARE IF:  Your condition is not improving after your transfusion. You develop redness or irritation at the intravenous (IV) site. SEEK IMMEDIATE MEDICAL CARE IF:  Any of the following symptoms occur over the next 12 hours: Shaking chills. You have a temperature by mouth above 102 F (38.9 C), not controlled by medicine. Chest, back, or muscle pain. People around you feel you are not acting correctly or are confused. Shortness of breath or difficulty breathing. Dizziness and fainting. You get a rash or develop hives. You have a decrease in urine output. Your urine turns a dark color or changes to pink, red, or brown. Any of the following symptoms occur over the next 10 days: You have a temperature by mouth above 102 F (38.9 C), not controlled by medicine. Shortness of breath. Weakness after normal activity. The  white part of the eye turns yellow (jaundice). You have a decrease in the amount of urine or are urinating less often. Your urine turns a dark color or changes to pink, red, or brown. Document Released: 05/30/2000 Document Revised: 08/25/2011 Document Reviewed: 01/17/2008 [facility] Patient Information 2014 [place], [place].MORNING OF SURGERY DRINK:   DRINK 1 G2 drink BEFORE YOU LEAVE HOME, DRINK ALL OF THE  G2 DRINK AT ONE TIME.   NO SOLID FOOD AFTER 600 PM THE NIGHT BEFORE YOUR SURGERY. YOU MAY DRINK CLEAR FLUIDS. THE G2 DRINK YOU DRINK BEFORE YOU LEAVE HOME WILL BE THE LAST FLUIDS YOU DRINK BEFORE SURGERY.  PAIN IS EXPECTED AFTER SURGERY AND WILL NOT BE COMPLETELY ELIMINATED. AMBULATION AND TYLENOL  WILL HELP REDUCE INCISIONAL AND GAS PAIN. MOVEMENT IS KEY!  YOU ARE EXPECTED TO BE OUT OF BED WITHIN 4 HOURS OF ADMISSION TO YOUR PATIENT ROOM.  SITTING IN THE RECLINER THROUGHOUT THE DAY IS IMPORTANT FOR DRINKING FLUIDS AND MOVING GAS THROUGHOUT THE GI TRACT.  COMPRESSION STOCKINGS SHOULD BE WORN [facility] STAY UNLESS YOU ARE WALKING.   INCENTIVE SPIROMETER SHOULD BE USED EVERY HOUR WHILE AWAKE TO DECREASE POST-OPERATIVE COMPLICATIONS SUCH AS PNEUMONIA.  WHEN DISCHARGED HOME, IT IS IMPORTANT TO CONTINUE TO WALK EVERY HOUR AND USE THE INCENTIVE SPIROMETER EVERY HOUR.

## 2023-11-02 NOTE — Progress Notes (Addendum)
 COVID Vaccine received:  [x]  No []  Yes Date of any COVID positive Test in last 90 days: no PCP - Christean Courts NP Cardiologist - Gloriann Larger MD  Chest x-ray -  EKG -  04/24/23 Epic Stress Test - 06/02/23 Epic ECHO - 06/02/23 Epic Cardiac Cath -   Cardiac clearance 06/02/23- Dayna Dunn PA-C  Bowel Prep - [x]  No  []   Yes ______  Pacemaker / ICD device [x]  No []  Yes   Spinal Cord Stimulator:[x]  No []  Yes       History of Sleep Apnea? []  No [x]  Yes   CPAP used?- []  No [x]  Yes    Does the patient monitor blood sugar?          [x]  No []  Yes  []  N/A  Patient has: [x]  NO Hx DM   []  Pre-DM                 []  DM1  []   DM2 Does patient have a [employer] or [employer]om? []  No []  Yes   Fasting Blood Sugar Ranges-  Checks Blood Sugar _____ times a day  GLP1 agonist / usual dose - no GLP1 instructions:  SGLT-2 inhibitors / usual dose - no SGLT-2 instructions:   Blood Thinner / Instructions:no Aspirin  Instructions:no  Comments:   Activity level: Patient is able  to climb a flight of stairs without difficulty; [x]  No CP  [x]  No SOB,  Patient can perform ADLs without assistance.   Anesthesia review:   Patient denies shortness of breath, fever, cough and chest pain at PAT appointment.  Patient verbalized understanding and agreement to the Pre-Surgical Instructions that were given to them at this PAT appointment. Patient was also educated of the need to review these PAT instructions again prior to his/her surgery.I reviewed the appropriate phone numbers to call if they have any and questions or concerns.

## 2023-11-03 ENCOUNTER — Encounter (HOSPITAL_COMMUNITY): Payer: Self-pay

## 2023-11-03 ENCOUNTER — Other Ambulatory Visit: Payer: Self-pay

## 2023-11-03 ENCOUNTER — Encounter (HOSPITAL_COMMUNITY)
Admission: RE | Admit: 2023-11-03 | Discharge: 2023-11-03 | Disposition: A | Discharge status: Home / Self Care | Source: Ambulatory Visit | Attending: General Surgery | Admitting: General Surgery

## 2023-11-03 VITALS — BP 127/89 | HR 68 | Temp 98.2°F | Resp 18 | Ht 62.0 in | Wt 268.0 lb

## 2023-11-03 DIAGNOSIS — I10 Essential (primary) hypertension: Secondary | ICD-10-CM

## 2023-11-03 DIAGNOSIS — Z01818 Encounter for other preprocedural examination: Secondary | ICD-10-CM

## 2023-11-03 HISTORY — DX: Personal history of other diseases of the digestive system: Z87.19

## 2023-11-03 LAB — CBC WITH DIFFERENTIAL/PLATELET
Abs Immature Granulocytes: 0.02 10*3/uL (ref 0.00–0.07)
Basophils Absolute: 0.1 10*3/uL (ref 0.0–0.1)
Basophils Relative: 1 %
Eosinophils Absolute: 0.4 10*3/uL (ref 0.0–0.5)
Eosinophils Relative: 5 %
HCT: 44.8 % (ref 36.0–46.0)
Hemoglobin: 13.9 g/dL (ref 12.0–15.0)
Immature Granulocytes: 0 %
Lymphocytes Relative: 46 %
Lymphs Abs: 3.7 10*3/uL (ref 0.7–4.0)
MCH: 28.6 pg (ref 26.0–34.0)
MCHC: 31 g/dL (ref 30.0–36.0)
MCV: 92.2 fL (ref 80.0–100.0)
Monocytes Absolute: 0.5 10*3/uL (ref 0.1–1.0)
Monocytes Relative: 7 %
Neutro Abs: 3.2 10*3/uL (ref 1.7–7.7)
Neutrophils Relative %: 41 %
Platelets: 333 10*3/uL (ref 150–400)
RBC: 4.86 MIL/uL (ref 3.87–5.11)
RDW: 15.3 % (ref 11.5–15.5)
WBC: 7.9 10*3/uL (ref 4.0–10.5)
nRBC: 0 % (ref 0.0–0.2)

## 2023-11-03 LAB — COMPREHENSIVE METABOLIC PANEL WITH GFR
ALT: 21 U/L (ref 0–44)
AST: 23 U/L (ref 15–41)
Albumin: 3.7 g/dL (ref 3.5–5.0)
Alkaline Phosphatase: 85 U/L (ref 38–126)
Anion gap: 9 (ref 5–15)
BUN: 13 mg/dL (ref 6–20)
CO2: 22 mmol/L (ref 22–32)
Calcium: 9.2 mg/dL (ref 8.9–10.3)
Chloride: 107 mmol/L (ref 98–111)
Creatinine, Ser: 0.77 mg/dL (ref 0.44–1.00)
GFR, Estimated: >60 mL/min (ref >60)
Glucose, Bld: 106 mg/dL — ABNORMAL HIGH (ref 70–99)
Potassium: 3.7 mmol/L (ref 3.5–5.1)
Sodium: 138 mmol/L (ref 135–145)
Total Bilirubin: 0.3 mg/dL (ref 0.0–1.2)
Total Protein: 7.6 g/dL (ref 6.5–8.1)

## 2023-11-07 ENCOUNTER — Other Ambulatory Visit: Payer: Self-pay | Admitting: Family

## 2023-11-07 DIAGNOSIS — E1165 Type 2 diabetes mellitus with hyperglycemia: Secondary | ICD-10-CM

## 2023-11-10 ENCOUNTER — Ambulatory Visit (HOSPITAL_COMMUNITY)
Admission: RE | Admit: 2023-11-10 | Discharge: 2023-11-11 | Disposition: A | Discharge status: Home / Self Care | Source: Ambulatory Visit | Attending: General Surgery | Admitting: General Surgery

## 2023-11-10 ENCOUNTER — Ambulatory Visit (HOSPITAL_COMMUNITY): Payer: Self-pay | Admitting: Anesthesiology

## 2023-11-10 ENCOUNTER — Encounter (HOSPITAL_COMMUNITY)
Admission: RE | Disposition: A | Discharge status: Home / Self Care | Payer: Self-pay | Source: Ambulatory Visit | Attending: General Surgery

## 2023-11-10 ENCOUNTER — Other Ambulatory Visit: Payer: Self-pay

## 2023-11-10 ENCOUNTER — Telehealth (HOSPITAL_COMMUNITY): Payer: Self-pay | Admitting: Pharmacy Technician

## 2023-11-10 ENCOUNTER — Encounter (HOSPITAL_COMMUNITY): Payer: Self-pay | Admitting: General Surgery

## 2023-11-10 ENCOUNTER — Other Ambulatory Visit (HOSPITAL_COMMUNITY): Payer: Self-pay

## 2023-11-10 DIAGNOSIS — Z6841 Body Mass Index (BMI) 40.0 and over, adult: Secondary | ICD-10-CM | POA: Insufficient documentation

## 2023-11-10 DIAGNOSIS — Z79899 Other long term (current) drug therapy: Secondary | ICD-10-CM | POA: Diagnosis not present

## 2023-11-10 DIAGNOSIS — E119 Type 2 diabetes mellitus without complications: Secondary | ICD-10-CM | POA: Insufficient documentation

## 2023-11-10 DIAGNOSIS — F32A Depression, unspecified: Secondary | ICD-10-CM | POA: Insufficient documentation

## 2023-11-10 DIAGNOSIS — M25562 Pain in left knee: Secondary | ICD-10-CM | POA: Diagnosis not present

## 2023-11-10 DIAGNOSIS — R1311 Dysphagia, oral phase: Secondary | ICD-10-CM | POA: Diagnosis not present

## 2023-11-10 DIAGNOSIS — Z833 Family history of diabetes mellitus: Secondary | ICD-10-CM | POA: Insufficient documentation

## 2023-11-10 DIAGNOSIS — G4733 Obstructive sleep apnea (adult) (pediatric): Secondary | ICD-10-CM | POA: Insufficient documentation

## 2023-11-10 DIAGNOSIS — F419 Anxiety disorder, unspecified: Secondary | ICD-10-CM | POA: Insufficient documentation

## 2023-11-10 DIAGNOSIS — E785 Hyperlipidemia, unspecified: Secondary | ICD-10-CM | POA: Diagnosis not present

## 2023-11-10 DIAGNOSIS — Z01818 Encounter for other preprocedural examination: Secondary | ICD-10-CM

## 2023-11-10 DIAGNOSIS — Z87891 Personal history of nicotine dependence: Secondary | ICD-10-CM | POA: Diagnosis not present

## 2023-11-10 DIAGNOSIS — N289 Disorder of kidney and ureter, unspecified: Secondary | ICD-10-CM | POA: Insufficient documentation

## 2023-11-10 DIAGNOSIS — Z8249 Family history of ischemic heart disease and other diseases of the circulatory system: Secondary | ICD-10-CM | POA: Diagnosis not present

## 2023-11-10 DIAGNOSIS — K219 Gastro-esophageal reflux disease without esophagitis: Secondary | ICD-10-CM | POA: Insufficient documentation

## 2023-11-10 DIAGNOSIS — M1990 Unspecified osteoarthritis, unspecified site: Secondary | ICD-10-CM | POA: Diagnosis not present

## 2023-11-10 DIAGNOSIS — R1310 Dysphagia, unspecified: Secondary | ICD-10-CM | POA: Insufficient documentation

## 2023-11-10 DIAGNOSIS — M25561 Pain in right knee: Secondary | ICD-10-CM | POA: Insufficient documentation

## 2023-11-10 DIAGNOSIS — I10 Essential (primary) hypertension: Secondary | ICD-10-CM | POA: Insufficient documentation

## 2023-11-10 DIAGNOSIS — Z9884 Bariatric surgery status: Secondary | ICD-10-CM | POA: Diagnosis not present

## 2023-11-10 DIAGNOSIS — E669 Obesity, unspecified: Secondary | ICD-10-CM | POA: Diagnosis not present

## 2023-11-10 HISTORY — PX: HIATAL HERNIA REPAIR: SHX195

## 2023-11-10 HISTORY — PX: UPPER GI ENDOSCOPY: SHX6162

## 2023-11-10 HISTORY — PX: LAPAROSCOPIC GASTRIC SLEEVE RESECTION: SHX5895

## 2023-11-10 LAB — TYPE AND SCREEN
ABO/RH(D): AB POS
Antibody Screen: POSITIVE
DAT, IgG: POSITIVE
PT AG Type: NEGATIVE
Unit division: 0
Unit division: 0

## 2023-11-10 LAB — BPAM RBC
Blood Product Expiration Date: 202506072359
Blood Product Expiration Date: 202506242359
Unit Type and Rh: 5100
Unit Type and Rh: 5100

## 2023-11-10 LAB — HEMOGLOBIN AND HEMATOCRIT, BLOOD
HCT: 46 % (ref 36.0–46.0)
Hemoglobin: 14 g/dL (ref 12.0–15.0)

## 2023-11-10 LAB — GLUCOSE, CAPILLARY
Glucose-Capillary: 103 mg/dL — ABNORMAL HIGH (ref 70–99)
Glucose-Capillary: 219 mg/dL — ABNORMAL HIGH (ref 70–99)
Glucose-Capillary: 208 mg/dL — ABNORMAL HIGH (ref 70–99)

## 2023-11-10 SURGERY — GASTRECTOMY, SLEEVE, LAPAROSCOPIC
Anesthesia: General

## 2023-11-10 MED ORDER — ENOXAPARIN (LOVENOX) PATIENT EDUCATION KIT
PACK | Freq: Once | Status: AC
  Filled 2023-11-10: qty 1

## 2023-11-10 MED ORDER — ONDANSETRON HCL 4 MG/2ML IJ SOLN
4.0000 mg | Freq: Four times a day (QID) | INTRAMUSCULAR | Status: DC | PRN
  Administered 2023-11-10 – 2023-11-11 (×3): 4 mg via INTRAVENOUS
  Filled 2023-11-10 (×3): qty 2

## 2023-11-10 MED ORDER — FENTANYL CITRATE (PF) 100 MCG/2ML IJ SOLN
INTRAMUSCULAR | Status: AC
  Filled 2023-11-10: qty 2

## 2023-11-10 MED ORDER — DEXAMETHASONE SODIUM PHOSPHATE 10 MG/ML IJ SOLN
INTRAMUSCULAR | Status: AC
  Filled 2023-11-10: qty 1

## 2023-11-10 MED ORDER — ROCURONIUM BROMIDE 10 MG/ML (PF) SYRINGE
PREFILLED_SYRINGE | INTRAVENOUS | Status: DC | PRN
Start: 2023-11-10 — End: 2023-11-10
  Administered 2023-11-10: 70 mg via INTRAVENOUS

## 2023-11-10 MED ORDER — HEPARIN SODIUM (PORCINE) 5000 UNIT/ML IJ SOLN
5000.0000 [IU] | INTRAMUSCULAR | Status: AC
  Administered 2023-11-10: 5000 [IU] via SUBCUTANEOUS
  Filled 2023-11-10: qty 1

## 2023-11-10 MED ORDER — ACETAMINOPHEN 160 MG/5ML PO SOLN: 1000.0000 mg | Freq: Three times a day (TID) | ORAL | Status: DC

## 2023-11-10 MED ORDER — LIDOCAINE HCL (PF) 2 % IJ SOLN
INTRAMUSCULAR | Status: AC
  Filled 2023-11-10: qty 5

## 2023-11-10 MED ORDER — ENSURE MAX PROTEIN PO LIQD
2.0000 [oz_av] | ORAL | Status: DC
  Administered 2023-11-11 (×3): 2 [oz_av] via ORAL

## 2023-11-10 MED ORDER — MIDAZOLAM HCL 2 MG/2ML IJ SOLN
INTRAMUSCULAR | Status: AC
  Filled 2023-11-10: qty 2

## 2023-11-10 MED ORDER — EPHEDRINE SULFATE-NACL 50-0.9 MG/10ML-% IV SOSY
PREFILLED_SYRINGE | INTRAVENOUS | Status: DC | PRN
  Administered 2023-11-10: 10 mg via INTRAVENOUS

## 2023-11-10 MED ORDER — BUPIVACAINE-EPINEPHRINE (PF) 0.25% -1:200000 IJ SOLN
INTRAMUSCULAR | Status: AC
  Filled 2023-11-10: qty 30

## 2023-11-10 MED ORDER — APREPITANT 40 MG PO CAPS
40.0000 mg | ORAL_CAPSULE | ORAL | Status: AC
  Administered 2023-11-10: 40 mg via ORAL
  Filled 2023-11-10: qty 1

## 2023-11-10 MED ORDER — LIDOCAINE 2% (20 MG/ML) 5 ML SYRINGE: INTRAMUSCULAR | Status: DC | PRN

## 2023-11-10 MED ORDER — PHENYLEPHRINE 80 MCG/ML (10ML) SYRINGE FOR IV PUSH (FOR BLOOD PRESSURE SUPPORT)
PREFILLED_SYRINGE | INTRAVENOUS | Status: DC | PRN
  Administered 2023-11-10: 160 ug via INTRAVENOUS

## 2023-11-10 MED ORDER — GABAPENTIN 100 MG PO CAPS
100.0000 mg | ORAL_CAPSULE | ORAL | Status: AC
  Administered 2023-11-10: 100 mg via ORAL
  Filled 2023-11-10: qty 1

## 2023-11-10 MED ORDER — CHLORHEXIDINE GLUCONATE 4 % EX SOLN: Freq: Once | CUTANEOUS | Status: DC

## 2023-11-10 MED ORDER — CHLORHEXIDINE GLUCONATE 0.12 % MT SOLN
15.0000 mL | Freq: Once | OROMUCOSAL | Status: AC
  Administered 2023-11-10: 15 mL via OROMUCOSAL

## 2023-11-10 MED ORDER — PROPOFOL 10 MG/ML IV BOLUS
INTRAVENOUS | Status: DC | PRN
  Administered 2023-11-10: 200 mg via INTRAVENOUS

## 2023-11-10 MED ORDER — SERTRALINE HCL 50 MG PO TABS
50.0000 mg | ORAL_TABLET | Freq: Every day | ORAL | Status: DC
  Administered 2023-11-11: 50 mg via ORAL
  Filled 2023-11-10: qty 1

## 2023-11-10 MED ORDER — PROPOFOL 10 MG/ML IV BOLUS
INTRAVENOUS | Status: AC
  Filled 2023-11-10: qty 20

## 2023-11-10 MED ORDER — ACETAMINOPHEN 500 MG PO TABS
1000.0000 mg | ORAL_TABLET | ORAL | Status: DC
  Administered 2023-11-10: 1000 mg via ORAL

## 2023-11-10 MED ORDER — HEPARIN SODIUM (PORCINE) 5000 UNIT/ML IJ SOLN
5000.0000 [IU] | Freq: Three times a day (TID) | INTRAMUSCULAR | Status: DC
  Administered 2023-11-10 – 2023-11-11 (×3): 5000 [IU] via SUBCUTANEOUS
  Filled 2023-11-10 (×3): qty 1

## 2023-11-10 MED ORDER — CELECOXIB 200 MG PO CAPS
200.0000 mg | ORAL_CAPSULE | Freq: Once | ORAL | Status: AC
  Administered 2023-11-10: 200 mg via ORAL
  Filled 2023-11-10: qty 1

## 2023-11-10 MED ORDER — SODIUM CHLORIDE 0.9 % IV SOLN: 12.5000 mg | Freq: Four times a day (QID) | INTRAVENOUS | Status: DC | PRN

## 2023-11-10 MED ORDER — SUGAMMADEX SODIUM 200 MG/2ML IV SOLN
INTRAVENOUS | Status: DC | PRN
  Administered 2023-11-10: 200 mg via INTRAVENOUS
  Administered 2023-11-10: 100 mg via INTRAVENOUS

## 2023-11-10 MED ORDER — OXYCODONE HCL 5 MG/5ML PO SOLN
5.0000 mg | Freq: Four times a day (QID) | ORAL | Status: DC | PRN
  Administered 2023-11-10 – 2023-11-11 (×4): 5 mg via ORAL
  Filled 2023-11-10 (×4): qty 5

## 2023-11-10 MED ORDER — ACETAMINOPHEN 500 MG PO TABS
1000.0000 mg | ORAL_TABLET | Freq: Three times a day (TID) | ORAL | Status: DC
  Administered 2023-11-10 – 2023-11-11 (×2): 1000 mg via ORAL
  Filled 2023-11-10 (×3): qty 2

## 2023-11-10 MED ORDER — AMLODIPINE BESYLATE 10 MG PO TABS
10.0000 mg | ORAL_TABLET | Freq: Every day | ORAL | Status: DC
  Administered 2023-11-10 – 2023-11-11 (×2): 10 mg via ORAL
  Filled 2023-11-10 (×2): qty 1

## 2023-11-10 MED ORDER — PANTOPRAZOLE SODIUM 40 MG IV SOLR
40.0000 mg | Freq: Every day | INTRAVENOUS | Status: DC
  Administered 2023-11-10: 40 mg via INTRAVENOUS
  Filled 2023-11-10: qty 10

## 2023-11-10 MED ORDER — LABETALOL HCL 5 MG/ML IV SOLN: 5.0000 mg | INTRAVENOUS | Status: DC | PRN

## 2023-11-10 MED ORDER — ONDANSETRON HCL 4 MG/2ML IJ SOLN
INTRAMUSCULAR | Status: DC | PRN
  Administered 2023-11-10: 4 mg via INTRAVENOUS

## 2023-11-10 MED ORDER — BUPIVACAINE-EPINEPHRINE 0.25% -1:200000 IJ SOLN
INTRAMUSCULAR | Status: DC | PRN
  Administered 2023-11-10: 60 mL

## 2023-11-10 MED ORDER — DROPERIDOL 2.5 MG/ML IJ SOLN: 0.6250 mg | Freq: Once | INTRAMUSCULAR | Status: DC | PRN

## 2023-11-10 MED ORDER — ROCURONIUM BROMIDE 10 MG/ML (PF) SYRINGE
PREFILLED_SYRINGE | INTRAVENOUS | Status: AC
  Filled 2023-11-10: qty 10

## 2023-11-10 MED ORDER — HYDROMORPHONE HCL 1 MG/ML IJ SOLN
INTRAMUSCULAR | Status: DC | PRN
  Administered 2023-11-10 (×2): .5 mg via INTRAVENOUS

## 2023-11-10 MED ORDER — LACTATED RINGERS IV SOLN: INTRAVENOUS | Status: DC

## 2023-11-10 MED ORDER — GABAPENTIN 100 MG PO CAPS
100.0000 mg | ORAL_CAPSULE | Freq: Two times a day (BID) | ORAL | Status: DC
  Administered 2023-11-10 – 2023-11-11 (×2): 100 mg via ORAL
  Filled 2023-11-10 (×2): qty 1

## 2023-11-10 MED ORDER — HYDROMORPHONE HCL 2 MG/ML IJ SOLN
INTRAMUSCULAR | Status: AC
  Filled 2023-11-10: qty 1

## 2023-11-10 MED ORDER — KCL IN DEXTROSE-NACL 20-5-0.45 MEQ/L-%-% IV SOLN
INTRAVENOUS | Status: AC
Start: 2023-11-10 — End: 2023-11-11
  Filled 2023-11-10 (×3): qty 1000

## 2023-11-10 MED ORDER — LIDOCAINE HCL (PF) 2 % IJ SOLN
INTRAMUSCULAR | Status: DC | PRN
Start: 2023-11-10 — End: 2023-11-10
  Administered 2023-11-10: 60 mg via INTRADERMAL

## 2023-11-10 MED ORDER — ACETAMINOPHEN 500 MG PO TABS
1000.0000 mg | ORAL_TABLET | Freq: Once | ORAL | Status: AC
  Filled 2023-11-10: qty 2

## 2023-11-10 MED ORDER — FENTANYL CITRATE (PF) 100 MCG/2ML IJ SOLN
INTRAMUSCULAR | Status: DC | PRN
  Administered 2023-11-10: 100 ug via INTRAVENOUS

## 2023-11-10 MED ORDER — LABETALOL HCL 5 MG/ML IV SOLN
INTRAVENOUS | Status: DC | PRN
  Administered 2023-11-10 (×2): 5 mg via INTRAVENOUS

## 2023-11-10 MED ORDER — LACTATED RINGERS IR SOLN
Status: DC | PRN
  Administered 2023-11-10: 1000 mL

## 2023-11-10 MED ORDER — ONDANSETRON HCL 4 MG/2ML IJ SOLN
INTRAMUSCULAR | Status: AC
  Filled 2023-11-10: qty 2

## 2023-11-10 MED ORDER — MIDAZOLAM HCL 5 MG/5ML IJ SOLN
INTRAMUSCULAR | Status: DC | PRN
  Administered 2023-11-10: 2 mg via INTRAVENOUS

## 2023-11-10 MED ORDER — SCOPOLAMINE 1 MG/3DAYS TD PT72
1.0000 | MEDICATED_PATCH | TRANSDERMAL | Status: DC
  Administered 2023-11-10: 1.5 mg via TRANSDERMAL
  Filled 2023-11-10: qty 1

## 2023-11-10 MED ORDER — INSULIN ASPART 100 UNIT/ML IJ SOLN
0.0000 [IU] | INTRAMUSCULAR | Status: DC
  Administered 2023-11-10 (×2): 5 [IU] via SUBCUTANEOUS
  Administered 2023-11-11: 3 [IU] via SUBCUTANEOUS
  Administered 2023-11-11: 2 [IU] via SUBCUTANEOUS

## 2023-11-10 MED ORDER — CEFOTETAN DISODIUM 2 G IJ SOLR
2.0000 g | INTRAMUSCULAR | Status: AC
  Administered 2023-11-10: 2 g via INTRAVENOUS
  Filled 2023-11-10: qty 2

## 2023-11-10 MED ORDER — HYDROMORPHONE HCL 1 MG/ML IJ SOLN: 0.2500 mg | INTRAMUSCULAR | Status: DC | PRN

## 2023-11-10 MED ORDER — HYDRALAZINE HCL 20 MG/ML IJ SOLN: 10.0000 mg | INTRAMUSCULAR | Status: DC | PRN

## 2023-11-10 MED ORDER — DEXAMETHASONE SODIUM PHOSPHATE 10 MG/ML IJ SOLN
4.0000 mg | INTRAMUSCULAR | Status: AC
  Administered 2023-11-10: 8 mg via INTRAVENOUS

## 2023-11-10 MED ORDER — METOPROLOL TARTRATE 50 MG PO TABS
100.0000 mg | ORAL_TABLET | Freq: Two times a day (BID) | ORAL | Status: DC
  Administered 2023-11-10 – 2023-11-11 (×2): 100 mg via ORAL
  Filled 2023-11-10 (×2): qty 2

## 2023-11-10 MED ORDER — TRAZODONE HCL 50 MG PO TABS: 50.0000 mg | ORAL_TABLET | Freq: Every evening | ORAL | Status: DC | PRN

## 2023-11-10 MED ORDER — MORPHINE SULFATE (PF) 2 MG/ML IV SOLN: 1.0000 mg | INTRAVENOUS | Status: DC | PRN

## 2023-11-10 MED ORDER — LOSARTAN POTASSIUM 50 MG PO TABS
50.0000 mg | ORAL_TABLET | Freq: Every day | ORAL | Status: DC
  Administered 2023-11-11: 50 mg via ORAL
  Filled 2023-11-10: qty 1

## 2023-11-10 MED ORDER — SIMETHICONE 80 MG PO CHEW
80.0000 mg | CHEWABLE_TABLET | Freq: Four times a day (QID) | ORAL | Status: DC | PRN
  Administered 2023-11-10: 80 mg via ORAL
  Filled 2023-11-10: qty 1

## 2023-11-10 MED ORDER — ORAL CARE MOUTH RINSE: 15.0000 mL | Freq: Once | OROMUCOSAL | Status: AC

## 2023-11-10 SURGICAL SUPPLY — 94 items
APPLICATOR ARISTA FLEXITIP XL (MISCELLANEOUS) IMPLANT
APPLICATOR COTTON TIP 6 STRL (MISCELLANEOUS) ×2 IMPLANT
APPLICATOR COTTON TIP 6IN STRL (MISCELLANEOUS) ×1 IMPLANT
BAG COUNTER SPONGE SURGICOUNT (BAG) IMPLANT
BENZOIN TINCTURE PRP APPL 2/3 (GAUZE/BANDAGES/DRESSINGS) IMPLANT
BLADE EXTENDED COATED 6.5IN (ELECTRODE) IMPLANT
BLADE HEX COATED 2.75 (ELECTRODE) ×2 IMPLANT
BLADE SURG 15 STRL LF DISP TIS (BLADE) ×2 IMPLANT
BLADE SURG SZ11 CARB STEEL (BLADE) ×2 IMPLANT
BNDG ADH 1X3 SHEER STRL LF (GAUZE/BANDAGES/DRESSINGS) ×12 IMPLANT
CABLE HIGH FREQUENCY MONO STRZ (ELECTRODE) IMPLANT
CHLORAPREP W/TINT 26 (MISCELLANEOUS) ×4 IMPLANT
CLIP APPLIE ROT 10 11.4 M/L (STAPLE) IMPLANT
CLIP APPLIE ROT 13.4 12 LRG (CLIP) IMPLANT
COVER MAYO STAND STRL (DRAPES) IMPLANT
COVER SURGICAL LIGHT HANDLE (MISCELLANEOUS) ×2 IMPLANT
DERMABOND ADVANCED .7 DNX12 (GAUZE/BANDAGES/DRESSINGS) IMPLANT
DEVICE SUT QUICK LOAD TK 5 (SUTURE) IMPLANT
DEVICE SUT TI-KNOT TK 5X26 (SUTURE) IMPLANT
DEVICE SUTURE ENDOST 10MM (ENDOMECHANICALS) IMPLANT
DISSECTOR BLUNT TIP ENDO 5MM (MISCELLANEOUS) IMPLANT
DRAIN PENROSE 0.5X18 (DRAIN) ×2 IMPLANT
DRAPE LAPAROSCOPIC ABDOMINAL (DRAPES) ×2 IMPLANT
DRAPE UTILITY XL STRL (DRAPES) ×4 IMPLANT
DRAPE WARM FLUID 44X44 (DRAPES) IMPLANT
DRSG TEGADERM 2-3/8X2-3/4 SM (GAUZE/BANDAGES/DRESSINGS) ×12 IMPLANT
ELECT REM PT RETURN 15FT ADLT (MISCELLANEOUS) ×2 IMPLANT
ELECTRODE L-HOOK LAP 45CM DISP (ELECTROSURGICAL) IMPLANT
GAUZE SPONGE 2X2 8PLY STRL LF (GAUZE/BANDAGES/DRESSINGS) IMPLANT
GAUZE SPONGE 4X4 12PLY STRL (GAUZE/BANDAGES/DRESSINGS) ×2 IMPLANT
GLOVE BIO SURGEON STRL SZ7.5 (GLOVE) ×2 IMPLANT
GLOVE INDICATOR 8.0 STRL GRN (GLOVE) ×2 IMPLANT
GOWN STRL REUS W/ TWL XL LVL3 (GOWN DISPOSABLE) ×4 IMPLANT
GRASPER SUT TROCAR 14GX15 (MISCELLANEOUS) ×2 IMPLANT
HANDLE SUCTION POOLE (INSTRUMENTS) IMPLANT
HEMOSTAT ARISTA ABSORB 3G PWDR (HEMOSTASIS) IMPLANT
IRRIGATION SUCT STRKRFLW 2 WTP (MISCELLANEOUS) ×2 IMPLANT
KIT BASIN OR (CUSTOM PROCEDURE TRAY) ×2 IMPLANT
KIT TURNOVER KIT A (KITS) IMPLANT
MARKER SKIN DUAL TIP RULER LAB (MISCELLANEOUS) ×2 IMPLANT
MAT PREVALON FULL STRYKER (MISCELLANEOUS) ×2 IMPLANT
NDL SPNL 22GX3.5 QUINCKE BK (NEEDLE) ×2 IMPLANT
NEEDLE SPNL 22GX3.5 QUINCKE BK (NEEDLE) ×1 IMPLANT
NS IRRIG 1000ML POUR BTL (IV SOLUTION) ×2 IMPLANT
PACK GENERAL/GYN (CUSTOM PROCEDURE TRAY) ×2 IMPLANT
PACK UNIVERSAL I (CUSTOM PROCEDURE TRAY) ×2 IMPLANT
PENCIL SMOKE EVACUATOR (MISCELLANEOUS) IMPLANT
RELOAD STAPLE 60 3.6 BLU REG (STAPLE) ×2 IMPLANT
RELOAD STAPLE 60 3.8 GOLD REG (STAPLE) IMPLANT
RELOAD STAPLE 60 4.1 GRN THCK (STAPLE) ×2 IMPLANT
RELOAD STAPLE 60 BLK VRY/THCK (STAPLE) IMPLANT
RELOAD STAPLER 60MM BLK (STAPLE) IMPLANT
RELOAD STAPLER BLUE 60MM (STAPLE) ×2 IMPLANT
RELOAD STAPLER GOLD 60MM (STAPLE) ×3 IMPLANT
RELOAD STAPLER GREEN 60MM (STAPLE) ×1 IMPLANT
SCISSORS LAP 5X45 EPIX DISP (ENDOMECHANICALS) IMPLANT
SEALANT SURGICAL APPL DUAL CAN (MISCELLANEOUS) IMPLANT
SET TUBE SMOKE EVAC HIGH FLOW (TUBING) ×2 IMPLANT
SHEARS HARMONIC 36 ACE (MISCELLANEOUS) IMPLANT
SHEARS HARMONIC 45 ACE (MISCELLANEOUS) ×2 IMPLANT
SLEEVE ADV FIXATION 5X100MM (TROCAR) ×4 IMPLANT
SLEEVE GASTRECTOMY 40FR VISIGI (MISCELLANEOUS) ×2 IMPLANT
SLEEVE Z-THREAD 5X100MM (TROCAR) ×6 IMPLANT
SOLUTION ANTFG W/FOAM PAD STRL (MISCELLANEOUS) ×2 IMPLANT
SPIKE FLUID TRANSFER (MISCELLANEOUS) ×2 IMPLANT
STAPLE LINE REINFORCEMENT LAP (STAPLE) IMPLANT
STAPLER ECHELON BIOABSB 60 FLE (MISCELLANEOUS) IMPLANT
STAPLER ECHELON LONG 3000 60 (ENDOMECHANICALS) IMPLANT
STAPLER ECHELON LONG 60 440 (INSTRUMENTS) IMPLANT
STAPLER SKIN PROX 35W (STAPLE) ×2 IMPLANT
STRIP CLOSURE SKIN 1/2X4 (GAUZE/BANDAGES/DRESSINGS) ×2 IMPLANT
SUT MNCRL AB 4-0 PS2 18 (SUTURE) ×2 IMPLANT
SUT SILK 0 30XBRD TIE 6 (SUTURE) ×2 IMPLANT
SUT SILK 2 0 SH CR/8 (SUTURE) IMPLANT
SUT SILK 2-0 18XBRD TIE 12 (SUTURE) IMPLANT
SUT SILK 3 0 SH CR/8 (SUTURE) IMPLANT
SUT SILK 3-0 18XBRD TIE 12 (SUTURE) IMPLANT
SUT SURGIDAC NAB ES-9 0 48 120 (SUTURE) IMPLANT
SUT VIC AB 2-0 SH 27X BRD (SUTURE) ×2 IMPLANT
SUT VICRYL 0 TIES 12 18 (SUTURE) ×2 IMPLANT
SUT VICRYL 0 UR6 27IN ABS (SUTURE) IMPLANT
SUT VICRYL 2 0 18 UND BR (SUTURE) IMPLANT
SYR 20ML LL LF (SYRINGE) ×2 IMPLANT
SYR 50ML LL SCALE MARK (SYRINGE) ×2 IMPLANT
SYSTEM KII OPTICAL ACCESS 15MM (TROCAR) ×2 IMPLANT
TOWEL OR 17X26 10 PK STRL BLUE (TOWEL DISPOSABLE) ×4 IMPLANT
TRAY FOLEY MTR SLVR 16FR STAT (SET/KITS/TRAYS/PACK) IMPLANT
TROCAR ADV FIXATION 5X100MM (TROCAR) ×2 IMPLANT
TROCAR BALLN 12MMX100 BLUNT (TROCAR) IMPLANT
TROCAR XCEL NON-BLD 5MMX100MML (ENDOMECHANICALS) ×2 IMPLANT
TROCAR Z-THREAD OPTICAL 5X100M (TROCAR) ×2 IMPLANT
TUBING CONNECTING 10 (TUBING) ×4 IMPLANT
TUBING ENDO SMARTCAP (MISCELLANEOUS) ×2 IMPLANT
YANKAUER SUCT BULB TIP NO VENT (SUCTIONS) IMPLANT

## 2023-11-10 NOTE — Op Note (Signed)
 11/10/2023 Kaitlyn Whitney 27-Mar-1966 [id number]   PRE-OPERATIVE DIAGNOSIS:   Severe obesity H/o laparoscopic adjustable gastric band 2010 with dysphagia Hypertension obstructive sleep apnea on CPAP,  bilateral knee pain  hyperlipidemia prediabetes   POST-OPERATIVE DIAGNOSIS:  same  PROCEDURE:  Procedure(s): Laparoscopic removal of adjustable gastric band and components LAPAROSCOPIC SLEEVE GASTRECTOMY  UPPER GI ENDOSCOPY Laparoscopic bilateral TAP block  SURGEON:  Surgeon(s): Fran Imus, MD FACS FASMBS  ASSISTANTS: Teddie Favre MD   ANESTHESIA:   general  DRAINS: none   BOUGIE: 40 fr ViSiGi  LOCAL MEDICATIONS USED:   Exparel   EBL: minima  SPECIMEN:  Source of Specimen:  Greater curvature of stomach;  lap band/port (discarded)  DISPOSITION OF SPECIMEN:  PATHOLOGY  COUNTS:  YES  INDICATION FOR PROCEDURE: 58 year old female with a remote history of an laparoscopic adjustable gastric band-APL-that was placed in 2010.  She had presented back in the fall with some GERD and intermittent dysphagia.  Esophagram revealed a tight band.  She desired removal of her band with conversion to a sleeve gastrectomy.  We had discussed on several occasions conversion to a Roux-en-Y gastric bypass but the patient declined and preferred sleeve gastrectomy.   The patient presents today for a planned laparoscopic removal of adjustable gastric band with conversion to laparoscopic sleeve gastrectomy with upper endoscopy. We have discussed the risk and benefits of the procedure extensively preoperatively. Please see my separate notes.  PROCEDURE: After obtaining informed consent and receiving 5000 units of subcutaneous heparin, the patient was brought to the operating room at [facility] and placed supine on the operating room table. General endotracheal anesthesia was established. Sequential compression devices were placed. A orogastric tube was placed. The patient's abdomen was  prepped and draped in the usual standard surgical fashion. The patient received preoperative IV antibiotic. A surgical timeout was performed. ERAS protocol used.   Access to the abdomen was achieved using a 5 mm 0 laparoscope thru a 5 mm trocar In the left upper Quadrant 2 fingerbreadths below the left subcostal margin using the Optiview technique. Pneumoperitoneum was smoothly established up to 15 mm of mercury. The laparoscope was advanced and the abdominal cavity was surveilled. The patient was then placed in reverse Trendelenburg.  Lap-Band tubing was readily visible.  A 5 mm trocar was placed slightly above and to the left of the umbilicus under direct visualization.  The [facility] liver retractor was placed under the left lobe of the liver through a 5 mm trocar incision site in the subxiphoid position. A 5 mm trocar was placed in the lateral right upper quadrant along with a 15 mm trocar in the mid right abdomen slightly medial to her port.. A final 5 mm trocar was placed in the lateral LUQ.  All under direct visualization after exparel  had been infiltrated in bilateral lateral upper abdominal walls as a TAP block for postoperative pain relief.  The stomach was inspected.  There was typical scar tissue over the Lap-Band.  It appeared properly orientated.  Using a laparoscopic Cook I incised the scar tissue directly on top of the band releasing some the cicatrix.  The buckle was visualized and an additional scar tissue around the buckle was taken down with the laparoscopic hook.  The band was then cut and then unbuckled.  The band was brought back around the upper stomach and extracted from the abdomen.  I then trimmed the tubing near the port where it entered the abdomen and brought out that tubing and discarded  that.  We then turned our attention back to the proximal stomach.  The patient had had 3 plication sutures placed at the time of her placement consisting of 0 Ethibond's each secured with a  titanium tie knot.  Each of those sutures were taken down with a combination of hook electrocautery or EndoShears without electrocautery.  We then took down the remaining fundus that was adhered to the left diaphragm with a combination of blunt dissection with and without electrocautery using the EndoShears.  The stomach was inspected and there was no evidence of injury to the stomach.  There was no overt evidence of a hiatal hernia.  Since there was no evidence of injury to the stomach we decided to proceed with sleeve gastrectomy.    We identified the pylorus and measured 6 cm proximal to the pylorus and identified an area of where we would start taking down the short gastric vessels. Harmonic scalpel was used to take down the short gastric vessels along the greater curvature of the stomach. We were able to enter the lesser sac. We continued to march along the greater curvature of the stomach taking down the short gastrics. As we approached the gastrosplenic ligament we took care in this area not to injure the spleen. We were able to take down the entire gastrosplenic ligament.  There was a little bit of bleeding in this area but hemostasis is achieved with scalpel.  We then mobilized the fundus away from the left crus of diaphragm. There were some significant posterior gastric avascular attachments and these were taken down with harmonic scalpel.. This left the stomach completely mobilized. No vessels had been taken down along the lesser curvature of the stomach.  We then reidentified the pylorus. A 40Fr ViSiGi was then placed in the oropharynx and advanced down into the stomach and placed in the distal antrum and positioned along the lesser curvature. It was placed under suction which secured the 40Fr ViSiGi in place along the lesser curve. Then using the Ethicon echelon 60 mm stapler with a green load with ethicon staple line reinforcement (ESLR), I placed a stapler along the antrum approximately 5 cm from  the pylorus. The stapler was angled so that there is ample room at the angularis incisura. I then fired the first staple load after inspecting it posteriorly to ensure adequate space both anteriorly and posteriorly. At this point I still was not completely past the angularis so with a gold load with ESLR, I placed the stapler in position just inside the prior stapleline. We then rotated the stomach to insure that there was adequate anteriorly as well as posteriorly. The stapler was then fired.  The echelon stapler was then repositioned with a 60 mm blue load with ESLR and we continued to march up along the ViSiGi. My assistant was holding traction along the greater curvature stomach along the cauterized short gastric vessels ensuring that the stomach was symmetrically retracted. Prior to each firing of the staple, we rotated the stomach to ensure that there is adequate stomach left.  As we approached the upper stomach and fundus I used 60 mm gold cartridge with ESLR aiming  lateral to the GE junction after mobilizing some of the esophageal fat pad.  The last fire was a blue load in which there as only about 2mm of stomach left. The sleeve was inspected. There is no evidence of cork screw. The staple line appeared hemostatic. The CRNA inflated the ViSiGi to the green zone and the upper  abdomen was flooded with saline. There were no bubbles. The sleeve was decompressed and the ViSiGi removed.  Pneumoperitoneum was reduced to 8 mmHg so we can inspect for bleeding along the staple line.  I did place 3 clips along the first staple line in the antrum where there did been a little bit of scant oozing along that staple line and there was no further evidence of oozing after the clips were placed.  The left upper quadrant was reinspected.  The area of the short gastric that had bled a little bit I decided to place a clip on it for additional security and though there is no evidence of bleeding at that time.  My assistant  scrubbed out and performed an upper endoscopy. The sleeve easily distended with air and the scope was easily advanced to the pylorus. There is no evidence of internal bleeding or cork screwing. There was no narrowing at the angularis. There is no evidence of bubbles. Please see his operative note for further details. The gastric sleeve was decompressed and the endoscope was removed.  I then placed Arista hemostatic powder along the sleeve staple line and in the left upper quadrant around the spleen.  The greater curvature the stomach was grasped with a laparoscopic grasper and removed from the 15 mm trocar site.  The liver retractor was removed. I then closed the 15 mm trocar site with 1 interrupted 0 Vicryl sutures through the fascia using the endoclose. The closure was viewed laparoscopically and it was airtight.  I then enlarged the 15 mm trocar site with a 15 blade.  The port was encountered and cautery was used to extract the port from surrounding tissue.  The sutures that had been placed along a small piece of Vicryl mesh were removed and the port was removed in its entirety.  The soft tissue cicatrix and fibrous capsule was excised with electrocautery.  We then returned to the abdomen with pneumoperitoneum and inspected the 15 mm trocar site.  It was still closed.  There is no air leak.  .  Remaining local was then infiltrated in the preperitoneal spaces around the trocar sites. Pneumoperitoneum was released.  The 15 mm trocar site was closed in 2 layers a 2-0 Vicryl in the deep subcutaneous tissue x 3 followed by 4 Monocryl in the subcuticular space.  All trocar sites were closed with a 4-0 Monocryl in a subcuticular fashion followed by the application of steri-strips, and bandaids. The patient was extubated and taken to the recovery room in stable condition. All needle, instrument, and sponge counts were correct x2. There are no immediate complications  (1) 60 mm green with ESLR (3) 60 mm gold with  ESLR (2) 60 mm blue with ESLR  PLAN OF CARE: Admit to inpatient   PATIENT DISPOSITION:  PACU - hemodynamically stable.   Delay start of Pharmacological VTE agent (>24hrs) due to surgical blood loss or risk of bleeding:  no  [name]. Elvan Hamel, MD, FACS FASMBS General, Bariatric, & Minimally Invasive Surgery [facility], [place]

## 2023-11-10 NOTE — Anesthesia Preprocedure Evaluation (Addendum)
 Anesthesia Evaluation  Patient identified by MRN, date of birth, ID band Patient awake    Reviewed: Allergy & Precautions, NPO status , Patient's Chart, lab work & pertinent test results, reviewed documented beta blocker date and time   Airway Mallampati: III  TM Distance: >3 FB Neck ROM: Full    Dental  (+) Dental Advisory Given, Edentulous Upper   Pulmonary sleep apnea and Continuous Positive Airway Pressure Ventilation , former smoker   Pulmonary exam normal breath sounds clear to auscultation       Cardiovascular hypertension, Pt. on home beta blockers and Pt. on medications Normal cardiovascular exam Rhythm:Regular Rate:Normal     Neuro/Psych  PSYCHIATRIC DISORDERS Anxiety Depression    negative neurological ROS     GI/Hepatic Neg liver ROS, hiatal hernia,GERD  ,,  Endo/Other  diabetes, Type 2, Oral Hypoglycemic Agents  Class 3 obesity  Renal/GU Renal InsufficiencyRenal disease     Musculoskeletal  (+) Arthritis ,    Abdominal   Peds  Hematology negative hematology ROS (+)   Anesthesia Other Findings Day of surgery medications reviewed with the patient.  Reproductive/Obstetrics                             Anesthesia Physical Anesthesia Plan  ASA: 3  Anesthesia Plan: General   Post-op Pain Management: Tylenol  PO (pre-op)*, Gabapentin  PO (pre-op)* and Celebrex PO (pre-op)*   Induction: Intravenous  PONV Risk Score and Plan: 4 or greater and Midazolam , Dexamethasone , Ondansetron  and Scopolamine  patch - Pre-op  Airway Management Planned: Oral ETT  Additional Equipment:   Intra-op Plan:   Post-operative Plan: Extubation in OR  Informed Consent: I have reviewed the patients History and Physical, chart, labs and discussed the procedure including the risks, benefits and alternatives for the proposed anesthesia with the patient or authorized representative who has indicated his/her  understanding and acceptance.     Dental advisory given  Plan Discussed with: CRNA  Anesthesia Plan Comments:         Anesthesia Quick Evaluation

## 2023-11-10 NOTE — Telephone Encounter (Signed)
 Pharmacy Patient Advocate Encounter  Insurance verification completed.    The patient is insured through [facility].     Ran test claim for Enoxaparin 40mg /0.52ml injection and the current 30 day co-pay is $0.00.   This test claim was processed through [facility]- copay amounts may vary at other pharmacies due to pharmacy/plan contracts, or as the patient moves through the different stages of their insurance plan.

## 2023-11-10 NOTE — Interval H&P Note (Signed)
 History and Physical Interval Note:  11/10/2023 10:09 AM  Kaitlyn Whitney  has presented today for surgery, with the diagnosis of MORBID OBESITY.  The various methods of treatment have been discussed with the patient and family. After consideration of risks, benefits and other options for treatment, the patient has consented to  Procedure(s): GASTRECTOMY, SLEEVE, LAPAROSCOPIC (N/A) ENDOSCOPY, UPPER GI TRACT (N/A) REMOVAL, GASTRIC BAND, LAPAROSCOPIC (N/A) REPAIR, HERNIA, HIATAL (N/A) as a surgical intervention.  The patient's history has been reviewed, patient examined, no change in status, stable for surgery.  I have reviewed the patient's chart and labs.  Questions were answered to the patient's satisfaction.    [name]. Elvan Hamel, MD, FACS General, Bariatric, & Minimally Invasive Surgery [facility],  [facility]   [place]

## 2023-11-10 NOTE — Op Note (Signed)
   Patient: Kaitlyn Whitney (1966/04/30, [id number])  Date of Surgery: 11/10/2023  Preoperative Diagnosis: MORBID OBESITY   Postoperative Diagnosis: MORBID OBESITY   Surgical Procedure: Upper Endoscopy   Surgeon: Teddie Favre, MD  Anesthesiologist: Juventino Oppenheim, MD   Anesthesia: General   Fluids:  Total I/O In: 100 [IV Piggyback:100] Out: -   Complications: None  Drains:  None  Specimen: None   Indications for Procedure: Kaitlyn Whitney is a 58 y.o. female undergoing band removal and conversion to sleeve gastrectomy and an EGD was requested to evaluate foregut anatomy intraoperatively.  Description of Procedure: During the procedure, I scrubbed out and obtained the Olympus endoscope. I gently placed endoscope in the patient's oropharynx and gently glided it down the esophagus without any difficulty under direct visualization.  The scope was advanced as far as the pylorus and then slowly withdrawn to inspect the foregut anatomy.  Dr. Elvan Hamel had placed saline in the upper abdomen and all staple lines were submerged to ensure no air leak. There was no evidence of bubbles. There was no evidence of intraluminal bleeding and the mucosa appeared healthy.  The lumen was widely patent without evidence of stricture.  The intraluminal insufflation was decompressed. The scope was withdrawn. The patient tolerated this portion of the procedure well. Please see Dr Ruddy Corral operative note for details regarding the remainder of the procedure.    Teddie Favre, MD General, Bariatric, & Minimally Invasive Surgery [facility], [place]

## 2023-11-10 NOTE — Plan of Care (Signed)
  Problem: Education: Goal: Knowledge of General Education information will improve Description: Including pain rating scale, medication(s)/side effects and non-pharmacologic comfort measures Outcome: Progressing   Problem: Health Behavior/Discharge Planning: Goal: Ability to manage health-related needs will improve Outcome: Progressing   Problem: Clinical Measurements: Goal: Ability to maintain clinical measurements within normal limits will improve Outcome: Progressing Goal: Will remain free from infection Outcome: Progressing Goal: Diagnostic test results will improve Outcome: Progressing Goal: Respiratory complications will improve Outcome: Progressing Goal: Cardiovascular complication will be avoided Outcome: Progressing   Problem: Activity: Goal: Risk for activity intolerance will decrease Outcome: Progressing   Problem: Nutrition: Goal: Adequate nutrition will be maintained Outcome: Progressing   Problem: Coping: Goal: Level of anxiety will decrease Outcome: Progressing   Problem: Elimination: Goal: Will not experience complications related to bowel motility Outcome: Progressing Goal: Will not experience complications related to urinary retention Outcome: Progressing   Problem: Pain Managment: Goal: General experience of comfort will improve and/or be controlled Outcome: Progressing   Problem: Safety: Goal: Ability to remain free from injury will improve Outcome: Progressing   Problem: Skin Integrity: Goal: Risk for impaired skin integrity will decrease Outcome: Progressing   Problem: Education: Goal: Ability to describe self-care measures that may prevent or decrease complications (Diabetes Survival Skills Education) will improve Outcome: Progressing Goal: Individualized Educational Video(s) Outcome: Progressing   Problem: Coping: Goal: Ability to adjust to condition or change in health will improve Outcome: Progressing   Problem: Fluid  Volume: Goal: Ability to maintain a balanced intake and output will improve Outcome: Progressing   Problem: Health Behavior/Discharge Planning: Goal: Ability to identify and utilize available resources and services will improve Outcome: Progressing Goal: Ability to manage health-related needs will improve Outcome: Progressing   Problem: Metabolic: Goal: Ability to maintain appropriate glucose levels will improve Outcome: Progressing   Problem: Nutritional: Goal: Maintenance of adequate nutrition will improve Outcome: Progressing Goal: Progress toward achieving an optimal weight will improve Outcome: Progressing   Problem: Skin Integrity: Goal: Risk for impaired skin integrity will decrease Outcome: Progressing   Problem: Tissue Perfusion: Goal: Adequacy of tissue perfusion will improve Outcome: Progressing   Problem: Education: Goal: Ability to state signs and symptoms to report to health care provider will improve Outcome: Progressing Goal: Knowledge of the prescribed self-care regimen will improve Outcome: Progressing Goal: Knowledge of discharge needs will improve Outcome: Progressing   Problem: Activity: Goal: Ability to tolerate increased activity will improve Outcome: Progressing   Problem: Bowel/Gastric: Goal: Gastrointestinal status for postoperative course will improve Outcome: Progressing Goal: Occurrences of nausea will decrease Outcome: Progressing   Problem: Coping: Goal: Development of coping mechanisms to deal with changes in body function or appearance will improve Outcome: Progressing   Problem: Fluid Volume: Goal: Maintenance of adequate hydration will improve Outcome: Progressing   Problem: Nutritional: Goal: Nutritional status will improve Outcome: Progressing   Problem: Clinical Measurements: Goal: Will show no signs or symptoms of venous thromboembolism Outcome: Progressing Goal: Will remain free from infection Outcome:  Progressing Goal: Will show no signs of GI Leak Outcome: Progressing   Problem: Respiratory: Goal: Will regain and/or maintain adequate ventilation Outcome: Progressing   Problem: Pain Management: Goal: Pain level will decrease Outcome: Progressing   Problem: Skin Integrity: Goal: Demonstration of wound healing without infection will improve Outcome: Progressing

## 2023-11-10 NOTE — Anesthesia Procedure Notes (Signed)
 Procedure Name: Intubation Date/Time: 11/10/2023 11:31 AM  Performed by: Josetta Niece, CRNAPre-anesthesia Checklist: Patient identified, Emergency Drugs available, Suction available and Patient being monitored Patient Re-evaluated:Patient Re-evaluated prior to induction Oxygen Delivery Method: Circle System Utilized Preoxygenation: Pre-oxygenation with 100% oxygen Induction Type: IV induction Ventilation: Mask ventilation without difficulty Laryngoscope Size: Mac and 3 Grade View: Grade II Tube type: Oral Tube size: 7.5 mm Number of attempts: 1 Airway Equipment and Method: Stylet Placement Confirmation: ETT inserted through vocal cords under direct vision, positive ETCO2 and breath sounds checked- equal and bilateral Secured at: 21 cm Tube secured with: Tape Dental Injury: Teeth and Oropharynx as per pre-operative assessment

## 2023-11-10 NOTE — H&P (Signed)
 PROVIDER: Gaelan Glennon Veldon German, MD  MRN: [id number] DOB: January 15, 1966 DATE OF ENCOUNTER: 10/14/2023 Subjective  Chief Complaint: Wound Check (RETURN WEIGHT LOSS)  Bariatric history: She underwent laparoscopic adjustable gastric band placement with an APL band on Oct 23, 2008. Her weight at that time was 277 pounds with a BMI of 54.5  Comorbidities at the time of surgery included hypertension, obstructive sleep apnea on CPAP, bilateral knee pain, hyperlipidemia  She now has prediabetes History of Present Illness: History of Present Illness Kaitlyn Whitney is a 58 year old female who presents for evaluation of fluid in her gastric band and consideration of sleeve gastrectomy.  She is being evaluated for fluid in her gastric band, which may be causing a delay in the passage of contrast observed during an esophagram performed in the fall. No new medical issues have arisen since her last visit in November or October.  She underwent a stress test and echocardiogram in December with good results. She reports no current symptoms of heartburn, sensation of food getting stuck, nighttime cough, or pain with swallowing.  She has not taken Ozempic  for about three months. Her current medications include amlodipine  10 mg, atorvastatin  40 mg, losartan , metoprolol , sertraline , and trazodone . She is allergic to penicillin.  She has completed her psychological evaluation and is scheduled for a nutritional evaluation for surgical pre-op on Monday.  Review of Systems: A complete review of systems was obtained from the patient. I have reviewed this information and discussed as appropriate with the patient. See HPI as well for other ROS.  ROS  Medical History: Past Medical History: Diagnosis Date Anxiety Sleep apnea  Patient Active Problem List Diagnosis History of laparoscopic adjustable gastric banding Hyperlipidemia Severe obesity (CMS/HHS-HCC) Obstructive sleep apnea (adult) (pediatric)  History  reviewed. No pertinent surgical history.  Allergies Allergen Reactions Penicillin Other (See Comments) YEAST INFECTION  Current Outpatient Medications on File Prior to Visit Medication Sig Dispense Refill amLODIPine  (NORVASC ) 10 MG tablet Take 1 tablet by mouth once daily atorvastatin  (LIPITOR) 40 MG tablet Take 1 tablet by mouth once daily cholecalciferol (VITAMIN D3) 1000 unit capsule Take 1,000 Units by mouth once daily losartan  (COZAAR ) 50 MG tablet Take 1 tablet by mouth once daily metoprolol  tartrate (LOPRESSOR ) 100 MG tablet Take by mouth potassium chloride  (KLOR-CON  M20) 20 MEQ ER tablet Take 20 mEq by mouth 2 (two) times daily sertraline  (ZOLOFT ) 50 MG tablet Take 1 tablet by mouth once daily traZODone  (DESYREL ) 50 MG tablet TAKE 0.5-1 TABLETS BY MOUTH AT BEDTIME AS NEEDED FOR SLEEP. semaglutide  (OZEMPIC ) 0.25 mg or 0.5 mg (2 mg/3 mL) pen injector Inject subcutaneously (Patient not taking: Reported on 10/14/2023)  No current facility-administered medications on file prior to visit.  Family History Problem Relation Age of Onset High blood pressure (Hypertension) Mother Stroke Brother Diabetes Brother   Social History  Tobacco Use Smoking Status Former Types: Cigarettes Smokeless Tobacco Not on file   Social History  Socioeconomic History Marital status: Married Tobacco Use Smoking status: Former Types: Cigarettes Vaping Use Vaping status: Unknown Substance and Sexual Activity Alcohol use: Not Currently Drug use: Never  Social Drivers of Health  Housing Stability: Unknown (10/14/2023) Housing Stability Vital Sign Homeless in the Last Year: No  Objective:  Vitals: 10/14/23 1050 BP: (!) 150/89 Pulse: 81 Temp: 36.7 C (98 F) Weight: (!) 125.3 kg (276 lb 3.2 oz) Height: 157.5 cm (5\' 2" )  Body mass index is 50.52 kg/m.  Gen: alert, NAD, non-toxic appearing Pupils: equal, no scleral icterus Pulm: Lungs clear  to auscultation, symmetric chest  rise CV: regular rate and rhythm Abd: soft, nontender, nondistended. old trocar sites. No cellulitis. No incisional hernia; palp port Ext: no edema, Skin: no rash, no jaundice  Labs, Imaging and Diagnostic Testing:  Upper gi 04/20/23 UPPER GI SERIES WITH KUB  TECHNIQUE: After obtaining a scout radiograph a routine upper GI series was performed using thin barium.  FLUOROSCOPY: Radiation Exposure Index (as provided by the fluoroscopic device): 49.6 mGy Kerma  COMPARISON: 03/09/2020.  FINDINGS: Scout view of the abdomen shows a normal bowel gas pattern. Laparoscopic band extends beyond the superior margin of the image.  Single contrast examination of the upper gastrointestinal tract shows decreased esophageal motility with some stasis of contrast in the esophagus during single swallow evaluation. There is a very narrow column of contrast through the laparoscopic band. There may be a tiny hiatal hernia. Stomach and duodenal bulb are unremarkable.  IMPRESSION: 1. Very narrow column of contrast traverses the laparoscopic band, as before. 2. Decreased esophageal motility. 3. Possible tiny hiatal hernia.  Echo 05/2023 Cardiac stress test 05/2023 The study is normal. The study is low risk. Pre-stress BP 261/124 mmHg. A pharmacological stress test was performed using IV Lexiscan  0.4mg  over 10 seconds performed without concurrent submaximal exercise. This patient was switched to Lexiscan , she was unable to keep with the treadmill. She was hot and had abdominal cramping. No ST deviation was noted. LV perfusion is normal. There is no evidence of ischemia. There is no evidence of infarction. Left ventricular function is normal. Nuclear stress EF: 62%. The left ventricular ejection fraction is normal (55-65%). End diastolic cavity size is normal. End systolic cavity size is normal. No evidence of transient ischemic dilation (TID) noted.  A1c 07/21/23 - 6.2  Cbc, cmet, lipid, tsh  10/24 Assessment and Plan: Diagnoses and all orders for this visit:  Severe obesity (CMS/HHS-HCC)  Hypertension, unspecified type  OSA (obstructive sleep apnea)  Hyperlipidemia, unspecified hyperlipidemia type  History of laparoscopic adjustable gastric banding    Assessment & Plan Severe obesity She is considering conversion from laparoscopic adjustable gastric banding to sleeve gastrectomy. The band currently has fluid, potentially delaying contrast passage on esophagram. Absence of vomiting, regurgitation, or severe reflux suggests the band is not overly tight. She has opted for sleeve gastrectomy, understanding risks and benefits, including potential long-term reflux and possible need for conversion to gastric bypass if reflux is unmanageable with medication. Revision surgery carries a a little higher leak risk due to thickened stomach from the band. Postoperative risk of blood clots will be assessed to determine Lovenox necessity. Weight loss from revision is generally less than primary surgery. She acknowledges the need for lifestyle changes post-surgery for weight loss. - Proceed with sleeve gastrectomy after band removal. - Ensure completion of preoperative nutrition class. - Discuss postoperative diet, including liquid diet and protein shakes. - Monitor for potential complications such as reflux and leaks post-surgery. - Educate on the importance of lifestyle changes for weight management post-surgery.  I offered to rediscuss steps of the procedure including risk and benefits but she declined. She has previously reviewed the surgical consent form and signed it. Offered for her to review it again but she declined.  I did try to access her Lap-Band port to get fluid out but I was unable to access the port. Her port has slipped. Per our records she has about 6 cc of fluid in her band which I think probably accounts for the upper GI findings. Currently she does  not have any foregut  symptoms I do not think it is prohibitive that she undergo a sleeve gastrectomy. We just rediscuss the potential issues of reflux long-term again.  Gastroesophageal reflux disease Currently asymptomatic for heartburn or reflux. Post-sleeve gastrectomy, there is a significant risk of developing reflux, potentially necessitating conversion to gastric bypass if unmanageable with medication. Long-term reflux risk is significant, with unpredictable categorization into 30% or 70% risk groups. - Monitor for reflux symptoms post-sleeve gastrectomy. - Educate on the potential need for conversion to gastric bypass if reflux is not controlled.  Hyperlipidemia She continues atorvastatin  40 mg daily for cholesterol management. - Continue atorvastatin  40 mg daily. This note has been created using automated tools and reviewed for accuracy by Clyde Upshaw MCADAMS Cleotha Whalin.  This patient encounter took 25 minutes today to perform the following: take history, perform exam, review outside records, interpret imaging, counsel the patient on their diagnosis and document encounter, findings & plan in the EHR  No follow-ups on file.  [name]. Elvan Hamel MD FACS General, Minimally Invasive, & Bariatric Surgery Electronically signed by Georgeanne King, MD at 10/14/2023

## 2023-11-10 NOTE — Anesthesia Postprocedure Evaluation (Signed)
 Anesthesia Post Note  Patient: Kaitlyn Whitney  Procedure(s) Performed: GASTRECTOMY, SLEEVE, LAPAROSCOPIC ENDOSCOPY, UPPER GI TRACT REMOVAL, GASTRIC BAND, LAPAROSCOPIC REPAIR, HERNIA, HIATAL     Patient location during evaluation: PACU Anesthesia Type: General Level of consciousness: awake and alert Pain management: pain level controlled Vital Signs Assessment: post-procedure vital signs reviewed and stable Respiratory status: spontaneous breathing, nonlabored ventilation and respiratory function stable Cardiovascular status: stable and blood pressure returned to baseline Anesthetic complications: no   No notable events documented.  Last Vitals:  Vitals:   11/10/23 1415 11/10/23 1435  BP: 134/87 134/88  Pulse: 65 69  Resp: 14 16  Temp:    SpO2: 95% 95%    Last Pain:  Vitals:   11/10/23 1435  TempSrc:   PainSc: Asleep                 Juventino Oppenheim

## 2023-11-10 NOTE — Discharge Instructions (Signed)

## 2023-11-10 NOTE — Progress Notes (Signed)
 Patient took a tremendous amount of effort to go from sitting to standing. Patient had perfusion on her forehead, having a hard time staying awake during the encounter. Patient stated she was having hot flashes. Room temperature lowered and cool damp washcloth provided for the patient, of which she did state she felt relief. Surgeon notified.

## 2023-11-10 NOTE — Progress Notes (Signed)
Discussed QI "Goals for Discharge" document with patient including ambulation in halls, Incentive Spirometry use every hour, and oral care.  Also discussed pain and nausea control.  Enabled or verified head of bed 30 degree alarm activated.  BSTOP education provided including BSTOP information guide, "Guide for Pain Management after your Bariatric Procedure".  Diet progression education provided including "Bariatric Surgery Post-Op Food Plan Phase 1: Liquids".  Questions answered.  Will continue to partner with bedside RN and follow up with patient per protocol.  

## 2023-11-10 NOTE — Transfer of Care (Signed)
 Immediate Anesthesia Transfer of Care Note  Patient: Kaitlyn Whitney  Procedure(s) Performed: GASTRECTOMY, SLEEVE, LAPAROSCOPIC ENDOSCOPY, UPPER GI TRACT REMOVAL, GASTRIC BAND, LAPAROSCOPIC REPAIR, HERNIA, HIATAL  Patient Location: PACU  Anesthesia Type:General  Level of Consciousness: drowsy  Airway & Oxygen Therapy: Patient Spontanous Breathing and Patient connected to face mask oxygen  Post-op Assessment: Report given to RN and Post -op Vital signs reviewed and stable  Post vital signs: Reviewed and stable  Last Vitals:  Vitals Value Taken Time  BP 148/90 11/10/23 1345  Temp    Pulse 68 11/10/23 1346  Resp 15 11/10/23 1346  SpO2 99 % 11/10/23 1346  Vitals shown include unfiled device data.  Last Pain:  Vitals:   11/10/23 1001  TempSrc:   PainSc: 0-No pain         Complications: No notable events documented.

## 2023-11-10 NOTE — Progress Notes (Signed)
 PHARMACY CONSULT FOR:  Risk Assessment for Post-Discharge VTE Following Bariatric Surgery  Procedure* Laparoscopic sleeve gastrectomy; removal of gastric band  Sex F  Black race Y  Age (years) 62  BMI (kg/m2) 49  Operation duration (minutes) 95 min  History of VTE requiring treatment* No  Hypercoagulable condition* No  Liver disorder* No  Pre-op venous stasis No  Pre-op functional health status Independent  Previous foregut or bariatric surg No  Post-op surgical site infection No  Transfusion intra- or post-op* No  Unplanned readmission No  Unplanned reoperation No  GI perforation/leak/obstruction* No  *specific risk factors for portomesenteric venous thrombosis   Predicted probability of 30-day post-discharge VTE:    0.54 % estimated using the [facility] Calculator   Recommendation for Discharge: Enoxaparin 40 mg [place] q12h x 2 weeks post-discharge   Kaitlyn Whitney is a 58 y.o. female who underwent  laparoscopic sleeve gastrectomy and removal of gastric band on 11/10/23.   Case start: 1148 Case end: 1323   Allergies  Allergen Reactions   Penicillins Other (See Comments)    Yeast infection Has patient had a PCN reaction causing immediate rash, facial/tongue/throat swelling, SOB or lightheadedness with hypotension: No Has patient had a PCN reaction causing severe rash involving mucus membranes or skin necrosis: No Has patient had a PCN reaction that required hospitalization: Unknown Has patient had a PCN reaction occurring within the last 10 years: No If all of the above answers are "NO", then may proceed with Cephalosporin use.    Patient Measurements: Weight: 121.7 kg (268 lb 6.4 oz) Body mass index is 49.09 kg/m.  No results for input(s): "WBC", "HGB", "HCT", "PLT", "APTT", "CREATININE", "LABCREA", "CREAT24HRUR", "MG", "PHOS", "ALBUMIN", "PROT", "AST", "ALT", "ALKPHOS", "BILITOT", "BILIDIR", "IBILI" in the last 72 hours. Estimated  Creatinine Clearance: 97.6 mL/min (by C-G formula based on SCr of 0.77 mg/dL).    Past Medical History:  Diagnosis Date   Anxiety    Arthritis    hands   Chronic kidney disease    kidney stones   GERD (gastroesophageal reflux disease)    History of hiatal hernia    History of kidney stones 2016   Hyperlipidemia    Hypertension    Obesity    Restless leg syndrome    Sleep apnea    c-pap     Medications Prior to Admission  Medication Sig Dispense Refill Last Dose/Taking   amLODipine  (NORVASC ) 10 MG tablet TAKE 1 TABLET BY MOUTH EVERY DAY 90 tablet 1 11/09/2023 at  3:30 PM   atorvastatin  (LIPITOR) 40 MG tablet TAKE 1 TABLET BY MOUTH EVERY DAY 90 tablet 1 11/09/2023 at  3:30 PM   Cholecalciferol (VITAMIN D3) 50 MCG (2000 UT) capsule Take 2,000 Units by mouth daily.   11/09/2023 at  3:30 PM   losartan  (COZAAR ) 50 MG tablet TAKE 1 TABLET BY MOUTH EVERY DAY 90 tablet 1 11/09/2023 at  3:30 PM   magnesium oxide (MAG-OX) 400 (240 Mg) MG tablet Take 400 mg by mouth daily. Pt uses spray instead   10/27/2023   metoprolol  tartrate (LOPRESSOR ) 100 MG tablet TAKE 1 TABLET (100 MG TOTAL) BY MOUTH 2 (TWO) TIMES DAILY. TAKE 1 TABLET BY MOUTH 2 HOURS PRIOR TO CT SCAN 180 tablet 1 11/09/2023 at  3:30 PM   MORINGA OLEIFERA PO Take 3 drops by mouth every morning.   10/27/2023   sertraline  (ZOLOFT ) 50 MG tablet TAKE 1 TABLET BY MOUTH EVERY DAY 90 tablet 1 11/09/2023  at  3:30 PM   traZODone  (DESYREL ) 50 MG tablet TAKE 0.5-1 TABLETS BY MOUTH AT BEDTIME AS NEEDED FOR SLEEP. 90 tablet 1 10/26/2023       Kaitlyn Whitney P 11/10/2023,2:09 PM

## 2023-11-11 ENCOUNTER — Encounter (HOSPITAL_COMMUNITY): Payer: Self-pay | Admitting: General Surgery

## 2023-11-11 DIAGNOSIS — M1990 Unspecified osteoarthritis, unspecified site: Secondary | ICD-10-CM | POA: Diagnosis not present

## 2023-11-11 DIAGNOSIS — Z9884 Bariatric surgery status: Secondary | ICD-10-CM | POA: Diagnosis not present

## 2023-11-11 DIAGNOSIS — F419 Anxiety disorder, unspecified: Secondary | ICD-10-CM | POA: Diagnosis not present

## 2023-11-11 DIAGNOSIS — N289 Disorder of kidney and ureter, unspecified: Secondary | ICD-10-CM | POA: Diagnosis not present

## 2023-11-11 DIAGNOSIS — I10 Essential (primary) hypertension: Secondary | ICD-10-CM | POA: Diagnosis not present

## 2023-11-11 DIAGNOSIS — F32A Depression, unspecified: Secondary | ICD-10-CM | POA: Diagnosis not present

## 2023-11-11 DIAGNOSIS — M25561 Pain in right knee: Secondary | ICD-10-CM | POA: Diagnosis not present

## 2023-11-11 DIAGNOSIS — R1310 Dysphagia, unspecified: Secondary | ICD-10-CM | POA: Diagnosis not present

## 2023-11-11 DIAGNOSIS — E785 Hyperlipidemia, unspecified: Secondary | ICD-10-CM | POA: Diagnosis not present

## 2023-11-11 DIAGNOSIS — E119 Type 2 diabetes mellitus without complications: Secondary | ICD-10-CM | POA: Diagnosis not present

## 2023-11-11 DIAGNOSIS — K219 Gastro-esophageal reflux disease without esophagitis: Secondary | ICD-10-CM | POA: Diagnosis not present

## 2023-11-11 DIAGNOSIS — G4733 Obstructive sleep apnea (adult) (pediatric): Secondary | ICD-10-CM | POA: Diagnosis not present

## 2023-11-11 DIAGNOSIS — Z6841 Body Mass Index (BMI) 40.0 and over, adult: Secondary | ICD-10-CM | POA: Diagnosis not present

## 2023-11-11 DIAGNOSIS — Z833 Family history of diabetes mellitus: Secondary | ICD-10-CM | POA: Diagnosis not present

## 2023-11-11 DIAGNOSIS — Z8249 Family history of ischemic heart disease and other diseases of the circulatory system: Secondary | ICD-10-CM | POA: Diagnosis not present

## 2023-11-11 DIAGNOSIS — M25562 Pain in left knee: Secondary | ICD-10-CM | POA: Diagnosis not present

## 2023-11-11 DIAGNOSIS — Z87891 Personal history of nicotine dependence: Secondary | ICD-10-CM | POA: Diagnosis not present

## 2023-11-11 DIAGNOSIS — Z79899 Other long term (current) drug therapy: Secondary | ICD-10-CM | POA: Diagnosis not present

## 2023-11-11 LAB — CBC WITH DIFFERENTIAL/PLATELET
Abs Immature Granulocytes: 0.06 10*3/uL (ref 0.00–0.07)
Basophils Absolute: 0 10*3/uL (ref 0.0–0.1)
Basophils Relative: 0 %
Eosinophils Absolute: 0 10*3/uL (ref 0.0–0.5)
Eosinophils Relative: 0 %
HCT: 40.8 % (ref 36.0–46.0)
Hemoglobin: 12.8 g/dL (ref 12.0–15.0)
Immature Granulocytes: 1 %
Lymphocytes Relative: 14 %
Lymphs Abs: 1.6 10*3/uL (ref 0.7–4.0)
MCH: 28.3 pg (ref 26.0–34.0)
MCHC: 31.4 g/dL (ref 30.0–36.0)
MCV: 90.3 fL (ref 80.0–100.0)
Monocytes Absolute: 0.7 10*3/uL (ref 0.1–1.0)
Monocytes Relative: 6 %
Neutro Abs: 9.6 10*3/uL — ABNORMAL HIGH (ref 1.7–7.7)
Neutrophils Relative %: 79 %
Platelets: 315 10*3/uL (ref 150–400)
RBC: 4.52 MIL/uL (ref 3.87–5.11)
RDW: 15.2 % (ref 11.5–15.5)
WBC: 12 10*3/uL — ABNORMAL HIGH (ref 4.0–10.5)
nRBC: 0 % (ref 0.0–0.2)

## 2023-11-11 LAB — GLUCOSE, CAPILLARY
Glucose-Capillary: 113 mg/dL — ABNORMAL HIGH (ref 70–99)
Glucose-Capillary: 127 mg/dL — ABNORMAL HIGH (ref 70–99)
Glucose-Capillary: 129 mg/dL — ABNORMAL HIGH (ref 70–99)
Glucose-Capillary: 165 mg/dL — ABNORMAL HIGH (ref 70–99)
Glucose-Capillary: 96 mg/dL (ref 70–99)

## 2023-11-11 LAB — COMPREHENSIVE METABOLIC PANEL WITH GFR
ALT: 28 U/L (ref 0–44)
AST: 30 U/L (ref 15–41)
Albumin: 3.3 g/dL — ABNORMAL LOW (ref 3.5–5.0)
Alkaline Phosphatase: 82 U/L (ref 38–126)
Anion gap: 7 (ref 5–15)
BUN: 8 mg/dL (ref 6–20)
CO2: 23 mmol/L (ref 22–32)
Calcium: 8.4 mg/dL — ABNORMAL LOW (ref 8.9–10.3)
Chloride: 105 mmol/L (ref 98–111)
Creatinine, Ser: 0.58 mg/dL (ref 0.44–1.00)
GFR, Estimated: >60 mL/min (ref >60)
Glucose, Bld: 127 mg/dL — ABNORMAL HIGH (ref 70–99)
Potassium: 4.2 mmol/L (ref 3.5–5.1)
Sodium: 135 mmol/L (ref 135–145)
Total Bilirubin: 0.4 mg/dL (ref 0.0–1.2)
Total Protein: 7.3 g/dL (ref 6.5–8.1)

## 2023-11-11 LAB — SURGICAL PATHOLOGY

## 2023-11-11 MED ORDER — ENOXAPARIN SODIUM 40 MG/0.4ML IJ SOSY: 40.0000 mg | PREFILLED_SYRINGE | Freq: Two times a day (BID) | INTRAMUSCULAR | 0 refills | Status: DC

## 2023-11-11 MED ORDER — PANTOPRAZOLE SODIUM 40 MG PO TBEC: 40.0000 mg | DELAYED_RELEASE_TABLET | Freq: Every day | ORAL | 0 refills | Status: DC

## 2023-11-11 MED ORDER — ONDANSETRON 4 MG PO TBDP: 4.0000 mg | ORAL_TABLET | Freq: Four times a day (QID) | ORAL | 0 refills | Status: DC | PRN

## 2023-11-11 MED ORDER — ACETAMINOPHEN 500 MG PO TABS: 1000.0000 mg | ORAL_TABLET | Freq: Three times a day (TID) | ORAL | Status: AC

## 2023-11-11 MED ORDER — OXYCODONE HCL 5 MG PO TABS: 5.0000 mg | ORAL_TABLET | Freq: Four times a day (QID) | ORAL | 0 refills | Status: DC | PRN

## 2023-11-11 MED ORDER — GABAPENTIN 100 MG PO CAPS: 100.0000 mg | ORAL_CAPSULE | Freq: Two times a day (BID) | ORAL | 0 refills | Status: DC

## 2023-11-11 NOTE — Progress Notes (Signed)
Reviewed written d/c instructions w pt and all questions answered. She verbalized understanding. D/C via w/c w all belongings in stable condition. 

## 2023-11-11 NOTE — Progress Notes (Signed)
 Pt very resistant to being OOB. Upon entering rm, pt still sleeping. When offering pt her protein and Tylenol  pt became angry, replied "Why are you waking me up? Leave me alone!" Marketing executive updated

## 2023-11-11 NOTE — Progress Notes (Signed)
 Pt has refused am protein and has refused to sit up in recliner. Stated "cut that light off and come back later."

## 2023-11-11 NOTE — Progress Notes (Signed)
    Name: Kaitlyn Whitney                 Patient MRN: [id number]  DOA: 11/10/2023   Patient seen in room 1305  Patient was sitting in bed upon arrival. Alert, husband at bedside. Patient reminded/ encouraged to work towards her bariatric post-op goals - pt receptive. Patient then transitioned to the recliner and upon encounter end, was ready to go for a walk.  Vital signs in last 24 hours:    11/11/2023   11:00 AM 11/11/2023    9:15 AM 11/11/2023    6:03 AM  Vitals with BMI  Height 5\' 2"     Weight 268 lbs 6 oz    BMI 49.08    Systolic  117 128  Diastolic  70 84  Pulse  54 60    Intake/Output:   Intake/Output Summary (Last 24 hours) at 11/11/2023 1214 Last data filed at 11/11/2023 0542 Gross per 24 hour  Intake 3158.45 ml  Output 370 ml  Net 2788.45 ml     Lab Results:    Latest Reference Range & Units 11/11/23 05:02  WBC 4.0 - 10.5 K/uL 12.0 (H)  RBC 3.87 - 5.11 MIL/uL 4.52  Hemoglobin 12.0 - 15.0 g/dL 24.4  HCT 01.0 - 27.2 % 40.8  MCV 80.0 - 100.0 fL 90.3  MCH 26.0 - 34.0 pg 28.3  MCHC 30.0 - 36.0 g/dL 53.6  RDW 64.4 - 03.4 % 15.2  Platelets 150 - 400 K/uL 315  nRBC 0.0 - 0.2 % 0.0  (H): Data is abnormally high  Pain is controlled. Patient is tolerating fluids, advanced to protein shake today, patient is tolerating well. Reviewed Gastric sleeve/bypass discharge instructions with patient and patient is able to articulate understanding. Provided information on BELT program, Support Group, BSTOP-D, and [facility] outpatient pharmacy. Communicated general update of patient status to surgeon. All questions answered. 24hr fluid recall is 300 mL per hydration protocol, bariatric nurse coordinator to make follow-up phone call within one week.     Thank you,  Lynnie Saucier, RN, MSN Bariatric Nurse Coordinator [phone] (office)

## 2023-11-11 NOTE — Progress Notes (Signed)
 1 Day Post-Op   Subjective/Chief Complaint: Some nausea with water intake Has walked Was sleepy after surgery for a few hours Family member at [facility]   Objective: Vital signs in last 24 hours: Temp:  [97.5 F (36.4 C)-98.5 F (36.9 C)] 97.6 F (36.4 C) (05/28 0915) Pulse Rate:  [54-76] 54 (05/28 0915) Resp:  [14-18] 16 (05/28 0915) BP: (117-166)/(70-103) 117/70 (05/28 0915) SpO2:  [94 %-100 %] 99 % (05/28 0915)    Intake/Output from previous day: 05/27 0701 - 05/28 0700 In: 3258.5 [P.O.:300; I.V.:2858.5; IV Piggyback:100] Out: 370 [Urine:350; Blood:20] Intake/Output this shift: No intake/output data recorded.  Asleep,easily awakens Nontoxic Symm chest rise, nonlabored Soft, expected mild TTP, incisions ok  Lab Results:  Recent Labs    11/10/23 1750 11/11/23 0502  WBC  --  12.0*  HGB 14.0 12.8  HCT 46.0 40.8  PLT  --  315   BMET Recent Labs    11/11/23 0502  NA 135  K 4.2  CL 105  CO2 23  GLUCOSE 127*  BUN 8  CREATININE 0.58  CALCIUM  8.4*   PT/INR No results for input(s): "LABPROT", "INR" in the last 72 hours. ABG No results for input(s): "PHART", "HCO3" in the last 72 hours.  Invalid input(s): "PCO2", "PO2"  Studies/Results: No results found.  Anti-infectives: Anti-infectives (From admission, onward)    Start     Dose/Rate Route Frequency Ordered Stop   11/10/23 0915  cefoTEtan (CEFOTAN) 2 g in sodium chloride  0.9 % 100 mL IVPB        2 g 200 mL/hr over 30 Minutes Intravenous On call to O.R. 11/10/23 0914 11/10/23 1120       Assessment/Plan: s/p Procedure(s): GASTRECTOMY, SLEEVE, LAPAROSCOPIC (N/A) ENDOSCOPY, UPPER GI TRACT (N/A) REMOVAL, GASTRIC BAND, LAPAROSCOPIC (N/A) REPAIR, HERNIA, HIATAL (N/A)  No fever, no tachy (but beta blocked), wbc ok No signs of leak Cont adv bari diet as tolerated Discussed importance of ambulation Vte prophy - cont subcu heparin, scds,; pt will need 2 weeks prophy lovenox given risk calculator, start  education  Hgb ok  Not sure if pt will meet dc criteria today - hasn't started protein yet, and pt a little unmotivated this am per nursing staff  LOS: 0 days    Kaitlyn Whitney 11/11/2023

## 2023-11-11 NOTE — Plan of Care (Signed)
 Problem: Education: Goal: Knowledge of General Education information will improve Description: Including pain rating scale, medication(s)/side effects and non-pharmacologic comfort measures Outcome: Adequate for Discharge   Problem: Health Behavior/Discharge Planning: Goal: Ability to manage health-related needs will improve Outcome: Adequate for Discharge   Problem: Clinical Measurements: Goal: Ability to maintain clinical measurements within normal limits will improve Outcome: Adequate for Discharge Goal: Will remain free from infection Outcome: Adequate for Discharge Goal: Diagnostic test results will improve Outcome: Adequate for Discharge Goal: Respiratory complications will improve Outcome: Adequate for Discharge Goal: Cardiovascular complication will be avoided Outcome: Adequate for Discharge   Problem: Activity: Goal: Risk for activity intolerance will decrease Outcome: Adequate for Discharge   Problem: Nutrition: Goal: Adequate nutrition will be maintained Outcome: Adequate for Discharge   Problem: Coping: Goal: Level of anxiety will decrease Outcome: Adequate for Discharge   Problem: Elimination: Goal: Will not experience complications related to bowel motility Outcome: Adequate for Discharge Goal: Will not experience complications related to urinary retention Outcome: Adequate for Discharge   Problem: Pain Managment: Goal: General experience of comfort will improve and/or be controlled Outcome: Adequate for Discharge   Problem: Safety: Goal: Ability to remain free from injury will improve Outcome: Adequate for Discharge   Problem: Skin Integrity: Goal: Risk for impaired skin integrity will decrease Outcome: Adequate for Discharge   Problem: Education: Goal: Ability to describe self-care measures that may prevent or decrease complications (Diabetes Survival Skills Education) will improve Outcome: Adequate for Discharge Goal: Individualized Educational  Video(s) Outcome: Adequate for Discharge   Problem: Coping: Goal: Ability to adjust to condition or change in health will improve Outcome: Adequate for Discharge   Problem: Fluid Volume: Goal: Ability to maintain a balanced intake and output will improve Outcome: Adequate for Discharge   Problem: Health Behavior/Discharge Planning: Goal: Ability to identify and utilize available resources and services will improve Outcome: Adequate for Discharge Goal: Ability to manage health-related needs will improve Outcome: Adequate for Discharge   Problem: Metabolic: Goal: Ability to maintain appropriate glucose levels will improve Outcome: Adequate for Discharge   Problem: Nutritional: Goal: Maintenance of adequate nutrition will improve Outcome: Adequate for Discharge Goal: Progress toward achieving an optimal weight will improve Outcome: Adequate for Discharge   Problem: Skin Integrity: Goal: Risk for impaired skin integrity will decrease Outcome: Adequate for Discharge   Problem: Tissue Perfusion: Goal: Adequacy of tissue perfusion will improve Outcome: Adequate for Discharge   Problem: Education: Goal: Ability to state signs and symptoms to report to health care provider will improve Outcome: Adequate for Discharge Goal: Knowledge of the prescribed self-care regimen will improve Outcome: Adequate for Discharge Goal: Knowledge of discharge needs will improve Outcome: Adequate for Discharge   Problem: Activity: Goal: Ability to tolerate increased activity will improve Outcome: Adequate for Discharge   Problem: Bowel/Gastric: Goal: Gastrointestinal status for postoperative course will improve Outcome: Adequate for Discharge Goal: Occurrences of nausea will decrease Outcome: Adequate for Discharge   Problem: Coping: Goal: Development of coping mechanisms to deal with changes in body function or appearance will improve Outcome: Adequate for Discharge   Problem: Fluid  Volume: Goal: Maintenance of adequate hydration will improve Outcome: Adequate for Discharge   Problem: Nutritional: Goal: Nutritional status will improve Outcome: Adequate for Discharge   Problem: Clinical Measurements: Goal: Will show no signs or symptoms of venous thromboembolism Outcome: Adequate for Discharge Goal: Will remain free from infection Outcome: Adequate for Discharge Goal: Will show no signs of GI Leak Outcome: Adequate for Discharge  Problem: Respiratory: Goal: Will regain and/or maintain adequate ventilation Outcome: Adequate for Discharge   Problem: Pain Management: Goal: Pain level will decrease Outcome: Adequate for Discharge   Problem: Skin Integrity: Goal: Demonstration of wound healing without infection will improve Outcome: Adequate for Discharge

## 2023-11-11 NOTE — Progress Notes (Signed)
   11/11/23 1114  TOC Brief Assessment  Insurance and Status Reviewed  Patient has primary care physician Yes  Home environment has been reviewed home with spouse  Prior level of function: independent  Prior/Current Home Services No current home services  Social Drivers of Health Review SDOH reviewed no interventions necessary  Readmission risk has been reviewed Yes  Transition of care needs no transition of care needs at this time

## 2023-11-12 NOTE — Discharge Summary (Signed)
 Physician Discharge Summary  Kaitlyn Whitney ZOX:[id number] DOB: 1966-03-04 DOA: 11/10/2023  PCP: Estil Heman, NP  Admit date: 11/10/2023 Discharge date: 11/11/2023  Recommendations for Outpatient Follow-up:    Follow-up Information     Aldean Hummingbird, MD Follow up on 12/04/2023.   Specialty: General Surgery Why: Please arrive 15 minutes prior to your appointment at 8:45am Contact information: [address] [place] [address] [phone]         Hillary Lowing, [place] Follow up on 01/05/2024.   Specialty: General Surgery Why: Please arrive 15 minutes prior to your appointment at 2:15pm with P. Gosai on behalf of Dr. Steffanie Edouard information: [address] [facility] [place] [address] [phone]                Discharge Diagnoses:  Principal Problem:   S/P laparoscopic sleeve gastrectomy Severe obesity H/o laparoscopic adjustable gastric band 2010 with dysphagia Hypertension obstructive sleep apnea on CPAP,  bilateral knee pain  hyperlipidemia prediabetes   Surgical Procedure: Laparoscopic removal of adjustable gastric band and components; Laparoscopic Sleeve Gastrectomy, upper endoscopy  Discharge Condition: Good Disposition: Home  Diet recommendation: Postoperative sleeve gastrectomy diet (liquids only)  Filed Weights   11/10/23 0853 11/11/23 1100  Weight: 121.7 kg 121.7 kg     Hospital Course:  The patient was admitted for a planned removal of laparoscopic adjustable gastric band with conversion to laparoscopic sleeve gastrectomy. Please see operative note. Preoperatively the patient was given 5000 units of subcutaneous heparin for DVT prophylaxis. Postoperative prophylactic heparin dosing was started on the evening of postoperative day 0. ERAS protocol was used. On the evening of postoperative day 0, the patient was started on water and ice chips. On postoperative day 1 the patient had no  fever or tachycardia and was tolerating water in their diet was gradually advanced throughout the day. The patient was ambulating without difficulty. Their vital signs are stable without fever or tachycardia. Their hemoglobin had remained stable. The patient was maintained on their home settings for CPAP therapy. The patient had received discharge instructions and counseling. They were deemed stable for discharge and had met discharge criteria  Because of the patient's risk profile she was sent out on 2 weeks of prophylactic Lovenox dosing twice a day.  Discharge Instructions  Discharge Instructions     Ambulate hourly while awake   Complete by: As directed    Call MD for:  difficulty breathing, headache or visual disturbances   Complete by: As directed    Call MD for:  persistant dizziness or light-headedness   Complete by: As directed    Call MD for:  persistant nausea and vomiting   Complete by: As directed    Call MD for:  redness, tenderness, or signs of infection (pain, swelling, redness, odor or green/yellow discharge around incision site)   Complete by: As directed    Call MD for:  severe uncontrolled pain   Complete by: As directed    Call MD for:  temperature >101 F   Complete by: As directed    Diet bariatric full liquid   Complete by: As directed    Discharge instructions   Complete by: As directed    See bariatric discharge instructions   Incentive spirometry   Complete by: As directed    Perform hourly while awake      Allergies as of 11/11/2023       Reactions   Penicillins Other (See  Comments)   Yeast infection Has patient had a PCN reaction causing immediate rash, facial/tongue/throat swelling, SOB or lightheadedness with hypotension: No Has patient had a PCN reaction causing severe rash involving mucus membranes or skin necrosis: No Has patient had a PCN reaction that required hospitalization: Unknown Has patient had a PCN reaction occurring within the last  10 years: No If all of the above answers are "NO", then may proceed with Cephalosporin use.        Medication List     STOP taking these medications    metFORMIN  500 MG tablet Commonly known as: GLUCOPHAGE        TAKE these medications    acetaminophen  500 MG tablet Commonly known as: TYLENOL  Take 2 tablets (1,000 mg total) by mouth every 8 (eight) hours for 5 days.   amLODipine  10 MG tablet Commonly known as: NORVASC  TAKE 1 TABLET BY MOUTH EVERY DAY Notes to patient: Monitor Blood Pressure Daily and keep a log for primary care physician.  You may need to make changes to your medications with rapid weight loss.      atorvastatin  40 MG tablet Commonly known as: LIPITOR TAKE 1 TABLET BY MOUTH EVERY DAY   enoxaparin  40 MG/0.4ML injection Commonly known as: LOVENOX  Inject 0.4 mLs (40 mg total) into the skin every 12 (twelve) hours for 14 days.   gabapentin  100 MG capsule Commonly known as: NEURONTIN  Take 1 capsule (100 mg total) by mouth every 12 (twelve) hours for 5 days.   losartan  50 MG tablet Commonly known as: COZAAR  TAKE 1 TABLET BY MOUTH EVERY DAY Notes to patient: Monitor Blood Pressure Daily and keep a log for primary care physician.  You may need to make changes to your medications with rapid weight loss.      magnesium oxide 400 (240 Mg) MG tablet Commonly known as: MAG-OX Take 400 mg by mouth daily. Pt uses spray instead   metoprolol  tartrate 100 MG tablet Commonly known as: LOPRESSOR  TAKE 1 TABLET (100 MG TOTAL) BY MOUTH 2 (TWO) TIMES DAILY. TAKE 1 TABLET BY MOUTH 2 HOURS PRIOR TO CT SCAN Notes to patient: Monitor Blood Pressure Daily and keep a log for primary care physician.  You may need to make changes to your medications with rapid weight loss.      MORINGA OLEIFERA PO Take 3 drops by mouth every morning.   ondansetron  4 MG disintegrating tablet Commonly known as: ZOFRAN -ODT Take 1 tablet (4 mg total) by mouth every 6 (six) hours as needed for  nausea or vomiting.   oxyCODONE  5 MG immediate release tablet Commonly known as: Oxy IR/ROXICODONE  Take 1 tablet (5 mg total) by mouth every 6 (six) hours as needed for breakthrough pain or severe pain (pain score 7-10).   pantoprazole  40 MG tablet Commonly known as: PROTONIX  Take 1 tablet (40 mg total) by mouth daily.   sertraline  50 MG tablet Commonly known as: ZOLOFT  TAKE 1 TABLET BY MOUTH EVERY DAY Notes to patient: Schedule an appointment no later than one month post-op with your physician for appropriate management of any psych medications following surgery    traZODone  50 MG tablet Commonly known as: DESYREL  TAKE 0.5-1 TABLETS BY MOUTH AT BEDTIME AS NEEDED FOR SLEEP. Notes to patient: Schedule an appointment no later than one month post-op with your physician for appropriate management of any psych medications following surgery    Vitamin D3 50 MCG (2000 UT) capsule Take 2,000 Units by mouth daily.  Follow-up Information     Aldean Hummingbird, MD Follow up on 12/04/2023.   Specialty: General Surgery Why: Please arrive 15 minutes prior to your appointment at 8:45am Contact information: [address] [place] [address] [phone]         Hillary Lowing, [place] Follow up on 01/05/2024.   Specialty: General Surgery Why: Please arrive 15 minutes prior to your appointment at 2:15pm with Jewell Mose on behalf of Dr. Steffanie Edouard information: [address] [facility] [place] [address] [phone]                  The results of significant diagnostics from this hospitalization (including imaging, microbiology, ancillary and laboratory) are listed below for reference.    Significant Diagnostic Studies: No results found.  Labs: Basic Metabolic Panel: Recent Labs  Lab 11/11/23 0502  NA 135  K 4.2  CL 105  CO2 23  GLUCOSE 127*  BUN 8  CREATININE 0.58  CALCIUM  8.4*   Liver Function  Tests: Recent Labs  Lab 11/11/23 0502  AST 30  ALT 28  ALKPHOS 82  BILITOT 0.4  PROT 7.3  ALBUMIN 3.3*    CBC: Recent Labs  Lab 11/10/23 1750 11/11/23 0502  WBC  --  12.0*  NEUTROABS  --  9.6*  HGB 14.0 12.8  HCT 46.0 40.8  MCV  --  90.3  PLT  --  315    CBG: Recent Labs  Lab 11/11/23 0000 11/11/23 0601 11/11/23 0734 11/11/23 1122 11/11/23 1626  GLUCAP 165* 129* 127* 113* 96    Principal Problem:   S/P laparoscopic sleeve gastrectomy   Time coordinating discharge: 15 min  Signed:  Fran Imus, MD [facility] [phone] 11/12/2023, 9:12 AM

## 2023-11-14 LAB — TYPE AND SCREEN
ABO/RH(D): AB POS
Antibody Screen: POSITIVE
Unit division: 0
Unit division: 0

## 2023-11-14 LAB — BPAM RBC
Blood Product Expiration Date: 202506072359
Blood Product Expiration Date: 202506242359
Unit Type and Rh: 5100
Unit Type and Rh: 5100

## 2023-11-18 ENCOUNTER — Telehealth (HOSPITAL_COMMUNITY): Payer: Self-pay | Admitting: *Deleted

## 2023-11-18 NOTE — Telephone Encounter (Signed)
 Post-op follow-up -- voicemail left with call back number

## 2023-11-24 ENCOUNTER — Encounter: Attending: General Surgery

## 2023-11-24 VITALS — Wt 260.3 lb

## 2023-11-24 DIAGNOSIS — E669 Obesity, unspecified: Secondary | ICD-10-CM | POA: Insufficient documentation

## 2023-11-25 ENCOUNTER — Ambulatory Visit

## 2023-11-25 NOTE — Progress Notes (Addendum)
 2 Week Post-operative Nutrition Class  Patient was seen on 11/24/23 for post-op nutrition education at [facility].  Surgery Date: 11/10/23 Surgery Type: Sleeve Gastrectomy  Anthropometrics Start weight at [facility]: 282.7lbs  date: 05/11/23 Height: 5'2 Weight today: 260.3lbs  Clinical  Medical hx: HTN, sleep apnea, obesity Medications: amLODipine , atorvastatin , losartan , metFORMIN , pantoprazole , traZODone , Vitamin D Labs:  Notable signs/ symptoms: hunger sypmtoms Previous nutrient deficiencies: vitamin D Bowel habits: patient reports constipation  Body Composition Scale 11/24/23  Current body weight 260.3lbs  Total body fat % 48.6  Visceral fat 125.5lbs  Fat-free mass % 51.3  Total body water % 40.1  Muscle mass (lbs)   BMI 45.5  Body fat displacement    Instruction:  The following learning objectives were met by the patient during this course:  Identifies Soft Prepped Plan Advancement Guide Identifies Soft protein foods (phase 1), to begin at 2 weeks post-op Identifies additional soft protein foods, soft nonstarchy vegetables, fruits, and starches (phase 2) to begin at 3 weeks post-op  Identifies appropriate sources of fluids Understands limits for fat and sugar in food and beverage choices, healthy and unhealthy fats Understands the need for texture modifications, mastication, and bite sizes for solid foods (phase 1 and 2) Identifies appropriate multivitamin and calcium  supplements  Describes the need for physical activity and will follow MD recommendations. States when to call healthcare provider regarding medication questions or post-op complications.  Handouts provided:  Soft Prepped Plan Advancement Guide  Follow up Plan: Patient will follow up at [facility] for 3 month post-op MNT visit for diet advancement per MD

## 2023-11-26 ENCOUNTER — Telehealth: Payer: Self-pay | Admitting: Dietician

## 2023-11-26 NOTE — Telephone Encounter (Signed)
 Called patient and left message requesting a call back, regarding her reported constipation.

## 2024-01-06 ENCOUNTER — Other Ambulatory Visit: Payer: Self-pay | Admitting: Family

## 2024-01-06 DIAGNOSIS — E782 Mixed hyperlipidemia: Secondary | ICD-10-CM

## 2024-01-06 DIAGNOSIS — I10 Essential (primary) hypertension: Secondary | ICD-10-CM

## 2024-01-06 DIAGNOSIS — F32A Depression, unspecified: Secondary | ICD-10-CM

## 2024-01-06 NOTE — Telephone Encounter (Signed)
 Pharmacy requested refill.  Pended Rx and sent to [facility] for approval due to HIGH ALERT Warning.

## 2024-02-11 ENCOUNTER — Ambulatory Visit: Admitting: Dietician

## 2024-02-25 ENCOUNTER — Ambulatory Visit: Admitting: Dietician

## 2024-03-11 NOTE — Progress Notes (Signed)
 WAVA KILDOW                                          MRN: [id number]   03/11/2024   The [facility] Quality Team Specialist reviewed this patient medical record for the purposes of chart review for care gap closure. The following were reviewed: chart review for care gap closure-kidney health evaluation for diabetes:eGFR  and uACR.    [facility] Quality Team

## 2024-04-05 ENCOUNTER — Ambulatory Visit: Admitting: Family

## 2024-04-06 ENCOUNTER — Encounter: Admitting: Skilled Nursing Facility1

## 2024-07-02 ENCOUNTER — Other Ambulatory Visit: Payer: Self-pay | Admitting: Family

## 2024-07-02 DIAGNOSIS — I10 Essential (primary) hypertension: Secondary | ICD-10-CM

## 2024-07-02 DIAGNOSIS — E782 Mixed hyperlipidemia: Secondary | ICD-10-CM

## 2024-07-02 DIAGNOSIS — F32A Depression, unspecified: Secondary | ICD-10-CM

## 2024-07-04 NOTE — Telephone Encounter (Signed)
 Pharmacy requested refill. Patient is due for an appointment. Tried calling patient, NA. MyChart Message sent.   Pended Rx and sent to [facility] for Approval/Denial

## 2024-07-06 ENCOUNTER — Ambulatory Visit: Admitting: Family

## 2024-07-06 VITALS — BP 146/96 | HR 86 | Temp 97.2°F | Ht 62.0 in | Wt 239.6 lb

## 2024-07-06 DIAGNOSIS — I10 Essential (primary) hypertension: Secondary | ICD-10-CM

## 2024-07-06 DIAGNOSIS — E559 Vitamin D deficiency, unspecified: Secondary | ICD-10-CM

## 2024-07-06 DIAGNOSIS — F419 Anxiety disorder, unspecified: Secondary | ICD-10-CM | POA: Diagnosis not present

## 2024-07-06 DIAGNOSIS — G8929 Other chronic pain: Secondary | ICD-10-CM | POA: Diagnosis not present

## 2024-07-06 DIAGNOSIS — Z0000 Encounter for general adult medical examination without abnormal findings: Secondary | ICD-10-CM

## 2024-07-06 DIAGNOSIS — E782 Mixed hyperlipidemia: Secondary | ICD-10-CM | POA: Diagnosis not present

## 2024-07-06 DIAGNOSIS — G4733 Obstructive sleep apnea (adult) (pediatric): Secondary | ICD-10-CM | POA: Diagnosis not present

## 2024-07-06 DIAGNOSIS — F5101 Primary insomnia: Secondary | ICD-10-CM | POA: Diagnosis not present

## 2024-07-06 DIAGNOSIS — F32A Depression, unspecified: Secondary | ICD-10-CM

## 2024-07-06 DIAGNOSIS — Z7985 Long-term (current) use of injectable non-insulin antidiabetic drugs: Secondary | ICD-10-CM | POA: Diagnosis not present

## 2024-07-06 DIAGNOSIS — E1165 Type 2 diabetes mellitus with hyperglycemia: Secondary | ICD-10-CM

## 2024-07-06 MED ORDER — ZEPBOUND 2.5 MG/0.5ML ~~LOC~~ SOAJ: 2.5000 mg | SUBCUTANEOUS | 1 refills | Status: AC

## 2024-07-06 MED ORDER — SERTRALINE HCL 50 MG PO TABS: 75.0000 mg | ORAL_TABLET | Freq: Every day | ORAL | 3 refills | Status: AC

## 2024-07-06 NOTE — Progress Notes (Signed)
 " Location:      Place of Service:    Provider: Ladon Vandenberghe FNP-C   Kris Burd, Roxan BROCKS, NP  Patient Care Team: Sharlize Hoar, Roxan BROCKS, NP as PCP - General (Family Medicine) Santo Stanly LABOR, MD as PCP - Cardiology (Cardiology) Dohmeier, Dedra, MD as Consulting Physician (Neurology)  Extended Emergency Contact Information Primary Emergency Contact: [facility] Address: [address]          [place], [place] [address] [place] Home Phone: [phone] Mobile Phone: [phone] Relation: Spouse Secondary Emergency Contact: Blanding,Timeka Address: [address]  [place] Home Phone: [phone] Relation: Sister  Code Status:  Full code Goals of care: Advanced Directive information    11/10/2023    2:32 PM  Advanced Directives  Would patient like information on creating a medical advance directive? No - Patient declined     Chief Complaint  Patient presents with   Annual Exam   Weight Loss    History of Present Illness   Kaitlyn Whitney is a 59 year old female who presents for a full physical exam and medication review.  She experiences ongoing anxiety and feels that her current medication, sertraline  50 mg, may need an increased dosage as she remains 'really anxious'. She also has a history of depression and is currently on sertraline  50 mg for both depression and anxiety.  She has a history of hypertension and monitors her blood pressure at home, noting it is usually normal around 126/84 mmHg. However, it was elevated at 152/84 mmHg on the right arm and 146/96 mmHg on the left arm during a recent check. She takes losartan  50 mg daily and metoprolol  100 mg twice daily for blood pressure management.  She has a history of high cholesterol and is currently taking atorvastatin  4 mg without any adverse effects.  She underwent weight loss surgery in May and initially lost weight, but reports a plateau over the past three months, losing only  about a pound. Her weight is currently 239 lbs, down from 278 lbs in April of the previous year. She engages in daily Tai Chi for 15 minutes and uses a riding machine under her desk for 30 minutes daily.  She has a history of obstructive sleep apnea and insomnia, for which she takes trazodone  50 mg daily for sleep. She discontinued gabapentin  and Zofran , and does not take Protonix  for acid reflux as she has not experienced symptoms.  She has a history of diabetes, with her last hemoglobin A1c recorded at 6.2, down from 6.5 eleven months ago.  No current issues with chest pain, shortness of breath, or acid reflux.   Past Medical History:  Diagnosis Date   Anxiety    Arthritis    hands   Chronic kidney disease    kidney stones   GERD (gastroesophageal reflux disease)    History of hiatal hernia    History of kidney stones 2016   Hyperlipidemia    Hypertension    Neuromuscular disorder (HCC)    Obesity    Restless leg syndrome    Sleep apnea    c-pap   Past Surgical History:  Procedure Laterality Date   ABDOMINAL HYSTERECTOMY     bone spur removal     left side of lumbar spine   BREAST SURGERY  2000   breast reduction   COLONOSCOPY     COLONOSCOPY WITH PROPOFOL  N/A 01/06/2023   Procedure: COLONOSCOPY WITH PROPOFOL ;  Surgeon: Legrand Victory LITTIE DOUGLAS, MD;  Location: [facility];  Service: Gastroenterology;  Laterality: N/A;   HIATAL HERNIA REPAIR N/A 11/10/2023   Procedure: REPAIR, HERNIA, HIATAL;  Surgeon: Tanda Locus, MD;  Location: [facility];  Service: General;  Laterality: N/A;   HYSTERECTOMY ABDOMINAL WITH SALPINGECTOMY Bilateral 08/18/2017   Procedure: SUPRACERVICAL HYSTERECTOMY ABDOMINAL;  Surgeon: Lorence Ozell CROME, MD;  Location: [facility];  Service: Gynecology;  Laterality: Bilateral;   LAPAROSCOPIC GASTRIC BANDING  ~ 2010   LAPAROSCOPIC GASTRIC SLEEVE RESECTION N/A 11/10/2023   Procedure: GASTRECTOMY, SLEEVE, LAPAROSCOPIC;  Surgeon: Tanda Locus, MD;  Location: [facility];  Service:  General;  Laterality: N/A;   LYSIS OF ADHESION N/A 08/18/2017   Procedure: LYSIS OF ADHESION;  Surgeon: Lorence Ozell CROME, MD;  Location: [facility];  Service: Gynecology;  Laterality: N/A;   POLYPECTOMY  01/06/2023   Procedure: POLYPECTOMY;  Surgeon: Legrand Victory CROME DOUGLAS, MD;  Location: [facility];  Service: Gastroenterology;;   REDUCTION MAMMAPLASTY Bilateral    SALPINGOOPHORECTOMY Bilateral 08/18/2017   Procedure: SALPINGO OOPHORECTOMY;  Surgeon: Lorence Ozell CROME, MD;  Location: [facility];  Service: Gynecology;  Laterality: Bilateral;   SPINE SURGERY     TUBAL LIGATION  2005   UPPER GI ENDOSCOPY N/A 11/10/2023   Procedure: ENDOSCOPY, UPPER GI TRACT;  Surgeon: Tanda Locus, MD;  Location: [facility];  Service: General;  Laterality: N/A;    Allergies[1]  Allergies as of 07/06/2024       Reactions   Penicillins Other (See Comments)   Yeast infection Has patient had a PCN reaction causing immediate rash, facial/tongue/throat swelling, SOB or lightheadedness with hypotension: No Has patient had a PCN reaction causing severe rash involving mucus membranes or skin necrosis: No Has patient had a PCN reaction that required hospitalization: Unknown Has patient had a PCN reaction occurring within the last 10 years: No If all of the above answers are NO, then may proceed with Cephalosporin use.        Medication List        Accurate as of July 06, 2024 11:59 PM. If you have any questions, ask your nurse or doctor.          STOP taking these medications    enoxaparin  40 MG/0.4ML injection Commonly known as: LOVENOX  Stopped by: Roxan Plough, NP   gabapentin  100 MG capsule Commonly known as: NEURONTIN  Stopped by: Roxan Plough, NP   ondansetron  4 MG disintegrating tablet Commonly known as: ZOFRAN -ODT Stopped by: Adonnis Salceda, NP   oxyCODONE  5 MG immediate release tablet Commonly known as: Oxy IR/ROXICODONE  Stopped by: Roxan Plough, NP   pantoprazole  40 MG tablet Commonly  known as: PROTONIX  Stopped by: Roxan Plough, NP       TAKE these medications    amLODipine  10 MG tablet Commonly known as: NORVASC  Take 1 tablet (10 mg total) by mouth daily. NEEDS an appointment before anymore future refills.   atorvastatin  40 MG tablet Commonly known as: LIPITOR Take 1 tablet (40 mg total) by mouth daily. NEEDS an appointment before anymore future refills.   losartan  50 MG tablet Commonly known as: COZAAR  Take 1 tablet (50 mg total) by mouth daily. NEEDS an appointment before anymore future refills.   magnesium oxide 400 (240 Mg) MG tablet Commonly known as: MAG-OX Take 400 mg by mouth daily. Pt uses spray instead   metoprolol  tartrate 100 MG tablet Commonly known as: LOPRESSOR  Take 1 tablet (100 mg total) by mouth 2 (two) times daily. Take 1 tablet by mouth 2 hours prior to CT scan.NEEDS an  appointment before anymore future refills.   MORINGA OLEIFERA PO Take 3 drops by mouth every morning.   sertraline  50 MG tablet Commonly known as: ZOLOFT  Take 1.5 tablets (75 mg total) by mouth daily. What changed:  how much to take additional instructions Changed by: Thaer Miyoshi, NP   traZODone  50 MG tablet Commonly known as: DESYREL  TAKE 0.5-1 TABLETS BY MOUTH AT BEDTIME AS NEEDED FOR SLEEP.   Vitamin D3 50 MCG (2000 UT) capsule Take 2,000 Units by mouth daily.   Zepbound  2.5 MG/0.5ML Pen Generic drug: tirzepatide  Inject 2.5 mg into the skin once a week. Started by: Roxan Plough, NP        Review of Systems  Constitutional:  Negative for appetite change, chills, fatigue, fever and unexpected weight change.  HENT:  Negative for congestion, dental problem, ear discharge, ear pain, hearing loss, nosebleeds, postnasal drip, rhinorrhea, sinus pressure, sinus pain, sneezing and sore throat.   Eyes:  Negative for pain, discharge, redness, itching and visual disturbance.  Respiratory:  Negative for cough, chest tightness, shortness of breath and  wheezing.   Cardiovascular:  Negative for chest pain, palpitations and leg swelling.  Gastrointestinal:  Negative for abdominal distention, abdominal pain, constipation, diarrhea, nausea and vomiting.  Endocrine: Negative for cold intolerance, heat intolerance, polydipsia, polyphagia and polyuria.  Genitourinary:  Negative for difficulty urinating, dysuria, flank pain, frequency and urgency.  Musculoskeletal:  Negative for arthralgias, back pain, gait problem, joint swelling, myalgias, neck pain and neck stiffness.  Skin:  Negative for color change, pallor, rash and wound.  Neurological:  Negative for dizziness, syncope, speech difficulty, weakness, light-headedness, numbness and headaches.  Hematological:  Does not bruise/bleed easily.  Psychiatric/Behavioral:  Negative for agitation, behavioral problems, confusion, hallucinations, self-injury, sleep disturbance and suicidal ideas. The patient is nervous/anxious.     Immunization History  Administered Date(s) Administered   Influenza Whole 07/01/2007   Influenza,inj,Quad PF,6+ Mos 04/22/2012   Tdap 02/22/2020   Pertinent  Health Maintenance Due  Topic Date Due   Mammogram  05/05/2025   Colonoscopy  01/05/2033   Influenza Vaccine  Discontinued      10/16/2021   12:55 PM 09/15/2022   11:02 AM 05/11/2023    3:07 PM 10/05/2023    9:31 AM 07/06/2024    9:32 AM  Fall Risk  Falls in the past year?  0 0 0 0  Was there an injury with Fall?  0   0  0  Fall Risk Category Calculator  0  0 0  (RETIRED) Patient Fall Risk Level Low fall risk       Patient at Risk for Falls Due to  No Fall Risks  No Fall Risks No Fall Risks  Fall risk Follow up    Falls evaluation completed Falls evaluation completed     Data saved with a previous flowsheet row definition   Functional Status Survey:    Vitals:   07/06/24 0943 07/06/24 0945  BP: (!) 152/84 (!) 146/96  Pulse: 86   Temp: (!) 97.2 F (36.2 C)   SpO2: 98%   Weight: 239 lb 9.6 oz (108.7 kg)    Height: 5' 2 (1.575 m)    Body mass index is 43.82 kg/m. Physical Exam  VITALS: BP- 152/84 MEASUREMENTS: Weight- 239, BMI- 43.82. GENERAL: Alert, cooperative, well developed, no acute distress HEENT: Normocephalic, normal oropharynx, moist mucous membranes, ears normal bilaterally, pupils reactive to light, no sinus tenderness NECK: Thyroid  normal, no neck pain CHEST: Clear to auscultation bilaterally, no wheezes,  rhonchi, or crackles, no CVA tenderness CARDIOVASCULAR: Normal heart rate and rhythm, S1 and S2 normal without murmurs ABDOMEN: Soft, non-tender, non-distended, without organomegaly, normal bowel sounds, liver and spleen normal EXTREMITIES: No cyanosis or edema, no calf tenderness or swelling MUSCULOSKELETAL: Knee normal range of motion, no hand pain NEUROLOGICAL: Cranial nerves grossly intact, moves all extremities without gross motor or sensory deficit, reflexes normal  SKIN: No rash,no lesion or erythema   PSYCHIATRY/BEHAVIORAL: Mood stable    Labs reviewed: Recent Labs    11/03/23 0930 11/11/23 0502  NA 138 135  K 3.7 4.2  CL 107 105  CO2 22 23  GLUCOSE 106* 127*  BUN 13 8  CREATININE 0.77 0.58  CALCIUM  9.2 8.4*   Recent Labs    11/03/23 0930 11/11/23 0502  AST 23 30  ALT 21 28  ALKPHOS 85 82  BILITOT 0.3 0.4  PROT 7.6 7.3  ALBUMIN 3.7 3.3*   Recent Labs    11/03/23 0930 11/10/23 1750 11/11/23 0502  WBC 7.9  --  12.0*  NEUTROABS 3.2  --  9.6*  HGB 13.9 14.0 12.8  HCT 44.8 46.0 40.8  MCV 92.2  --  90.3  PLT 333  --  315   Lab Results  Component Value Date   TSH 2.40 04/08/2023   Lab Results  Component Value Date   HGBA1C 6.2 (H) 07/20/2023   Lab Results  Component Value Date   CHOL 125 04/08/2023   HDL 44 (L) 04/08/2023   LDLCALC 65 04/08/2023   TRIG 80 04/08/2023   CHOLHDL 2.8 04/08/2023    Significant Diagnostic Results in last 30 days:  No results found.  Assessment/Plan  Morbid obesity status post bariatric  surgery BMI 43.82  Weight loss has plateaued with minimal weight loss over the past three months. Current weight is 239 lbs, down from 278 lbs in April of last year. BMI is 43.82. Discussed potential use of Zepbound  for weight loss, considering insurance coverage and contraindications. No family history of thyroid  cancer, making Zepbound  a viable option. Discussed potential side effects including constipation and nausea. - Prescribed Zepbound  2.5 mg weekly injection for weight loss. - Advised to monitor for constipation and nausea; use Miralax as needed. - Encouraged increased fiber intake and hydration. - Continue Tai Chi and use of exercise equipment daily.  Essential hypertension Blood pressure readings at home are generally within normal range, but elevated readings noted in the office, likely due to stress. Current medications include losartan  50 mg daily and metoprolol  100 mg twice daily. - Monitor blood pressure at home; report if consistently above 140/90 mmHg. - Continue current antihypertensive medications.  Type 2 diabetes mellitus with hyperglycemia Last HbA1c was 6.2, down from 6.5. No recent blood work available. - Scheduled fasting blood work for diabetes monitoring.  Mixed hyperlipidemia Currently managed with atorvastatin  4 mg daily. No reported side effects. - Continue atorvastatin  4 mg daily.  Anxiety and depression Current medication is Zoloft  50 mg daily. Reports persistent anxiety and requests dosage increase. - Increased Zoloft  to 75 mg daily.  Obstructive sleep apnea May qualify for Zepbound  coverage to assist with weight loss and sleep apnea   Primary insomnia Currently managed with trazodone  50 mg daily. - Continue trazodone  50 mg daily.  Vitamin D deficiency Currently taking vitamin D with K2 supplement. - Continue vitamin D with K2 supplementation.   Family/ staff Communication: Reviewed plan of care with patient verbalized understanding  Labs/tests  ordered: Urine microalbumin creatinine ratio  Next  Appointment : Return in about 1 year (around 07/06/2025) for annual Physical examination, Fasting labs in the morning.one month for weight check.   Spent 30 minutes of Face to face and non-face to face with patient  >50% time spent counseling; reviewing medical record; tests; labs; documentation and developing future plan of care.   Roxan BROCKS Floye Fesler, NP         [1]  Allergies Allergen Reactions   Penicillins Other (See Comments)    Yeast infection Has patient had a PCN reaction causing immediate rash, facial/tongue/throat swelling, SOB or lightheadedness with hypotension: No Has patient had a PCN reaction causing severe rash involving mucus membranes or skin necrosis: No Has patient had a PCN reaction that required hospitalization: Unknown Has patient had a PCN reaction occurring within the last 10 years: No If all of the above answers are NO, then may proceed with Cephalosporin use.   "

## 2024-07-07 ENCOUNTER — Other Ambulatory Visit

## 2024-07-07 LAB — MICROALBUMIN / CREATININE URINE RATIO
Creatinine, Urine: 206 mg/dL (ref 20–275)
Microalb Creat Ratio: 3 mg/g{creat}
Microalb, Ur: 0.7 mg/dL

## 2024-07-08 ENCOUNTER — Other Ambulatory Visit (HOSPITAL_COMMUNITY): Payer: Self-pay

## 2024-07-08 ENCOUNTER — Telehealth: Payer: Self-pay

## 2024-07-08 ENCOUNTER — Ambulatory Visit: Payer: Self-pay | Admitting: Family

## 2024-07-08 NOTE — Telephone Encounter (Signed)
 Pharmacy Patient Advocate Encounter   Received notification from Physician's Office that prior authorization for Zebpound 2.5mg /0.51ml is required/requested.   Insurance verification completed.   The patient is insured through [employer].   Per test claim: Insurance companies are becoming increasingly stricter about requiring thorough documentation of lifestyle modifications in the patient's chart at each visit. This includes detailed records of diet recommendations (caloric intake, etc), exercise plans (amount of time/wk, etc), an emphasis on the patient's commitment to continuing these efforts while on medication, include no contraindications to GLP-1 therapy, thyroid  cancer history, or concurrent use of GLP-1s.  Without this additional documentation in the chart notes, a prior authorization will most likely be denied.   Key: [id number]

## 2024-08-08 ENCOUNTER — Ambulatory Visit: Admitting: Family

## 2025-07-10 ENCOUNTER — Encounter: Admitting: Family
